# Patient Record
Sex: Male | Born: 1960
Health system: Southern US, Community
[De-identification: ages and names within clinical notes are randomized; demographics above are authoritative.]

## PROBLEM LIST (undated history)

## (undated) DIAGNOSIS — I4891 Unspecified atrial fibrillation: Secondary | ICD-10-CM

## (undated) DIAGNOSIS — F419 Anxiety disorder, unspecified: Secondary | ICD-10-CM

## (undated) DIAGNOSIS — E7800 Pure hypercholesterolemia, unspecified: Secondary | ICD-10-CM

## (undated) DIAGNOSIS — I639 Cerebral infarction, unspecified: Secondary | ICD-10-CM

## (undated) DIAGNOSIS — G4733 Obstructive sleep apnea (adult) (pediatric): Secondary | ICD-10-CM

## (undated) DIAGNOSIS — I10 Essential (primary) hypertension: Secondary | ICD-10-CM

## (undated) DIAGNOSIS — I519 Heart disease, unspecified: Secondary | ICD-10-CM

## (undated) HISTORY — DX: Cerebral infarction, unspecified: I63.9

## (undated) HISTORY — DX: Essential (primary) hypertension: I10

## (undated) HISTORY — DX: Pure hypercholesterolemia, unspecified: E78.00

## (undated) HISTORY — DX: Obstructive sleep apnea (adult) (pediatric): G47.33

## (undated) HISTORY — DX: Heart disease, unspecified: I51.9

## (undated) HISTORY — DX: Morbid (severe) obesity due to excess calories: E66.01

## (undated) HISTORY — DX: Unspecified atrial fibrillation: I48.91

## (undated) HISTORY — DX: Anxiety disorder, unspecified: F41.9

---

## 1984-04-10 HISTORY — PX: HERNIA REPAIR: SHX51

## 2004-04-20 ENCOUNTER — Emergency Department (HOSPITAL_COMMUNITY): Admission: EM | Admit: 2004-04-20 | Discharge: 2004-04-20 | Payer: Self-pay | Admitting: Emergency Medicine

## 2004-10-06 ENCOUNTER — Ambulatory Visit (HOSPITAL_BASED_OUTPATIENT_CLINIC_OR_DEPARTMENT_OTHER): Admission: RE | Admit: 2004-10-06 | Discharge: 2004-10-06 | Payer: Self-pay | Admitting: Family Medicine

## 2004-10-09 ENCOUNTER — Ambulatory Visit: Payer: Self-pay | Admitting: Internal Medicine

## 2006-03-22 ENCOUNTER — Ambulatory Visit: Payer: Self-pay | Admitting: Internal Medicine

## 2006-03-29 ENCOUNTER — Ambulatory Visit: Payer: Self-pay

## 2006-03-29 ENCOUNTER — Ambulatory Visit: Payer: Self-pay | Admitting: Internal Medicine

## 2008-10-02 ENCOUNTER — Ambulatory Visit (HOSPITAL_COMMUNITY): Admission: RE | Admit: 2008-10-02 | Discharge: 2008-10-02 | Payer: Self-pay | Admitting: Gastroenterology

## 2009-10-05 ENCOUNTER — Encounter: Admission: RE | Admit: 2009-10-05 | Discharge: 2009-10-05 | Payer: Self-pay | Admitting: Internal Medicine

## 2010-01-04 ENCOUNTER — Encounter: Payer: Self-pay | Admitting: Internal Medicine

## 2010-03-17 ENCOUNTER — Ambulatory Visit: Payer: Self-pay | Admitting: Internal Medicine

## 2010-06-01 ENCOUNTER — Encounter: Payer: Self-pay | Admitting: Internal Medicine

## 2010-06-01 ENCOUNTER — Encounter (INDEPENDENT_AMBULATORY_CARE_PROVIDER_SITE_OTHER): Payer: 59 | Admitting: Internal Medicine

## 2010-06-01 DIAGNOSIS — I480 Paroxysmal atrial fibrillation: Secondary | ICD-10-CM

## 2010-06-01 DIAGNOSIS — G4733 Obstructive sleep apnea (adult) (pediatric): Secondary | ICD-10-CM | POA: Insufficient documentation

## 2010-06-01 DIAGNOSIS — I4891 Unspecified atrial fibrillation: Secondary | ICD-10-CM | POA: Insufficient documentation

## 2010-06-01 HISTORY — DX: Paroxysmal atrial fibrillation: I48.0

## 2010-06-07 NOTE — Assessment & Plan Note (Signed)
Summary: ec6/eval for ablation/.Marland KitchenMarland KitchenPER NOTES PT HAS [facility]/....   Visit Type:  np Referring Provider:  Jacinto Halim  CC:  fatigue.  History of Present Illness: Mr. Halt is seen at the request of Dr. Jacinto Halim because of atrial arrhythmias.  He is a morbidly obese American gentleman with treated obstructive sleep apnea, significant hypertension whom I met 4-1/2 years ago because of atrial arrhythmias occurring following an ablation for isthmus dependent flutter undertaken in [place] in 2003. Medical therapy was recommended. He was started on flecainide. This worked really well until just recently when he began having recurrences of tachycardia palpitations associated with shortness of breath and effort intolerance. Electrocardiogram demonstrated rapid SVT at the rate of 140 or so.  a 12-lead suggested perhaps a coarse atrial fibrillation.  His flecainide dose was increased and he has reverted to sinus rhythm. He is feeling better. Cardiac evaluation in the fall included an ultrasound demonstrating normal left ventricular function modest decrease in right ventricular function biatrial enlargement. Left ventricular dimensions and wall thicknesses are not available yet.  Thrombo- embolic risk factors are notable for prior stroke-2, hypertension-one.  his sleep apnea therapy is working quite well. He continues on warfarin.    Current Medications (verified): 1)  Flecainide Acetate 150 Mg Tabs (Flecainide Acetate) .... Two Times A Day 2)  Verapamil Hcl Cr 180 Mg Xr24h-Cap (Verapamil Hcl) .... Take One Capsule By Mouth Daily 3)  Diovan 320 Mg Tabs (Valsartan) .... Once Daily 4)  Allopurinol 300 Mg Tabs (Allopurinol) .... Once Daily 5)  Crestor 10 Mg Tabs (Rosuvastatin Calcium) .... Take One Tablet By Mouth Daily. 6)  Paxil 10 Mg Tabs (Paroxetine Hcl) .... Once Daily 7)  Warfarin Sodium 5 Mg Tabs (Warfarin Sodium) .... Use As Directed By Anticoagulation Clinic  Allergies (verified): No Known  Drug Allergies  Past History:  Past Medical History: Atrial Fibrillation Atrial Flutter Hyperlipidemia Hypertension Stroke 2001 Paroxysmal A.Fibrillation s/p ablation 2003 in [place] Chronic kidney disease Shortness of Breath Dyspnea gout  Past Surgical History: Cardiac Catheterization  Review of Systems       full review of systems was negative apart from a history of present illness and past medical history.   Vital Signs:  Patient profile:   50 year old male Height:      69 inches Weight:      341.25 pounds BMI:     50.58 Pulse rate:   74 / minute BP sitting:   154 / 101  (left arm) Cuff size:   regular  Vitals Entered By: Caralee Ates CMA (June 01, 2010 2:36 PM)  Physical Exam  General:  Well developed, morbidly obese middle-aged [place] American male appearing his stated age in no acute distress. Head:  normal HEENT Neck:  supple without thyromegaly; neck veins 10 cm Chest Wall:  without kyphosis Lungs:  clear to auscultation Heart:  distant heart sounds with a regular rhythm Abdomen:  soft, distended, active bowel sounds liver edge is 2 cm down positive HJR Msk:  Back normal, normal gait. Muscle strength and tone normal. Pulses:  pulses normal in all 4 extremities Extremities:  No clubbing or cyanosis or edema Neurologic:  Alert and oriented x 3.grossly normal motor and sensory function Skin:  without rashes but with tattoos Cervical Nodes:  no adenopathy Psych:  engaging affect   Impression & Recommendations:  Problem # 1:  ATRIAL FIBRILLATION (ICD-427.31) the patient has a coarse atrial fibrillation. It is possible that there is a flutter circuit here but  I'm not convinced. He had atrial fibrillation some years ago. Regular satiety to fibrillation with the flecainide is certainly possible. Currently on a higher dose of flecainide he is holding sinus rhythm. Pruritic potential for flexion 90s to be reassessed. A Myoview scan was negative. (November 11) We  need to know d left ventricular wall thickness dimensions; left atrial dimensions will be helpful in protecting the benefit of possible ablation. His updated medication list for this problem includes:    Flecainide Acetate 150 Mg Tabs (Flecainide acetate) .Marland Kitchen..Marland Kitchen Two times a day    Warfarin Sodium 5 Mg Tabs (Warfarin sodium) ..... Use as directed by anticoagulation clinic  Orders: EKG w/ Interpretation (93000)  Problem # 2:  MORBID OBESITY (ICD-278.01) obesity continues to this man's major health risk. He has gained 120 pounds in the last year and a half. This is back at the time of the death of his wife. His daughter has less cool.  Problem # 3:  HTN HRT & CKD BEN W/O HF & W/CKD STAGE (ICD-404.10) his blood pressure remains poorly controlled. His Diovan was increased today. I would favor increasing his verapamil or adding a beta blocker this will augment rate control. We'll see how he does on a higher dose Diovan His updated medication list for this problem includes:    Verapamil Hcl Cr 180 Mg Xr24h-cap (Verapamil hcl) .Marland Kitchen... Take one capsule by mouth daily    Diovan 320 Mg Tabs (Valsartan) ..... Once daily  Problem # 4:  SLEEP APNEA (ICD-780.57) this appears to be well treated  Patient Instructions: 1)  Your physician recommends that you continue on your current medications as directed. Please refer to the Current Medication list given to you today. 2)  Your physician wants you to follow-up in:  6 MONTHS WITH DR Logan Bores will receive a reminder letter in the mail two months in advance. If you don't receive a letter, please call our office to schedule the follow-up appointment.

## 2010-08-26 NOTE — Procedures (Signed)
Ricky Burton, Ricky Burton              ACCOUNT NO.:  [id number]   MEDICAL RECORD NO.:  [id number]          PATIENT TYPE:  OUT   LOCATION:  SLEEP CENTER                 FACILITY:  [facility]   PHYSICIAN:  Clinton D. Maple Hudson, M.D. DATE OF BIRTH:  [birth year]   DATE OF STUDY:  10/06/2004                              NOCTURNAL POLYSOMNOGRAM   REFERRING PHYSICIAN:  Dr. Casimiro Needle Hilts   INDICATION FOR STUDY:  Hypersomnia with sleep apnea.   EPWORTH SLEEPINESS SCORE:  14/24   BMI:  40   WEIGHT:  280 pounds   SLEEP ARCHITECTURE:  Total sleep time 414 minutes with sleep efficiency 86%.  Stage I 9%, Stage II 56%.  Stages III and IV are absent.  REM 35% of total  sleep time.  Sleep latency 22 minutes.  REM latency 107 minutes.  Awake  after sleep onset 47 minutes.  Arousal index 22.  No bedtime medication  taken.   RESPIRATORY DATA:  Respiratory disturbance index (RDI AHI) 57.7 obstructive  events per hour, indicating severe obstructive sleep apnea/hypopnea syndrome  for CPAP.  There were 136 obstructive apneas and 1 hypopnea before CPAP.  The events were not positional.  REM RDI 20 per hour.  CPAP was titrated to  16 CWP, RDI 5.3 per hour.  A large Respironics Comfort Full Series II mask  was used with heated humidifier.  He had some initial claustrophobia, then  relaxed and was able to sleep comfortably with CPAP.   OXYGEN DATA:  Moderate snoring with oxygen desaturation to a nadir of 50%  before CPAP.  After CPAP control, saturation held 92-98% on room air.   CARDIAC DATA:  Normal sinus rhythm with rare PVC.   MOVEMENT/PARASOMNIA:  Occasional leg jerk of insignificance.   IMPRESSION/RECOMMENDATION:  1.  Severe obstructive sleep apnea/hypopnea syndrome, RDI 57.7 per hour with      moderate snoring and oxygen desaturation to 50%.  2.  Successful CPAP titration to 16 CWP, RDI 5.3 per hour, using a large      Respironics Comfort Full Series II mask with heated humidifier.      Clinton D.  Maple Hudson, M.D.  Diplomat    CDY/MEDQ  D:  10/09/2004 14:00:29  T:  10/10/2004 06:55:38  Job:  [id number]

## 2010-08-26 NOTE — Letter (Signed)
March 29, 2006    Madaline Savage, M.D.  [phone] [address]  [place], [place] [address]   RE:  EMAD, BRECHTEL  MRN:  [id number]  /  DOB:  15-Jan-1961   Dear Annette Stable,   Mr. Stfort came in today for flecainide treadmill testing.  He was  submitted to a modification on Bruce protocol and achieved about 30  seconds in a modified stage IV.  It was notable for accomplishing a  heart rate of 146, representing 85% of his predicted maximal.  It is  also notable for significant systolic hypertension with blood pressure  in the 215 range.  His diastolic at rest was 104.   He has had problems with orthostatic hypotension in the past and low  blood pressures with an ARB/HCT combination.  I have asked that when he  sees you next week that he can discuss his blood pressure issues with  you.   If there is anything further we can do in his care, please do not  hesitate to contact us.    Sincerely,      Duke Salvia, MD, [facility]  Electronically Signed    SCK/MedQ  DD: 03/29/2006  DT: 03/29/2006  Job #: [id number]

## 2010-08-26 NOTE — Letter (Signed)
March 22, 2006    Madaline Savage, M.D.  [phone] [address], [address]  [place], [place] [address]   RE:  Ricky Burton, Ricky Burton  MRN:  [id number]  /  DOB:  04/25/1960   Dear Annette Stable:   It was a pleasure to see your patient and talk about him with you today.  As you know, [name] is a 50 year old gentleman with a history of  atrial arrhythmias.  He underwent atrial flutter ablation a couple of  years ago in [place].  He has a history of a stroke that resulted in  him being disabled in 2001 and has thromboembolic risk factors notable  for hypertension and congestive failure.   Over the last number of months and retrospectively earlier than that, he  has had problems with tachy palpitations that is accompanied by  lightheadedness, dyspnea.  He has not had peripheral edema.   He was seen initially and put on metoprolol.  This was associated with  orthostatic lightheadedness which prompted changes in medication to  Toprol.  I think that changed happened, but ultimately you saw him and  put him on sotalol 80 mg a day after undertaking a cardiac evaluation,  including an echo that was normal, with normal LA size, surprisingly,  and a Myoview scan that he reports was also negative.   The sotalol has been associated with a marked improvement in his  symptoms, even at the 80 mg dose.  He is less lightheaded.  He continues  to have irregularities, but nothing has been terribly fast.   Exercise tolerance has been better.   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  He started at 201 pounds, he got up to 330 pounds while  driving.  He is now down to 260.  He works out a couple days a week, as  much as he can, with treadmill, stationary bicycle and weights.   His thromboembolic risk factors are as noted above.   PAST MEDICAL HISTORY:  In addition to the above, is notable for:  1. Anxiety/depression (see below).  2. Gout.  3. Urinary problems.   PAST  SURGICAL HISTORY:  Notable for hip surgery x2 as well as his  catheter ablation undertaken in [place].   SOCIAL HISTORY:  He is retired/disabled.  He now takes care of his  elderly parents who live in [place].  He has one daughter who is a  Holiday representative at [employer].  He does not use cigarettes, alcohol or recreational  drugs.   MEDICATIONS:  Currently include:  1. Sotalol 80 b.i.d.  2. Aspirin 81.  3. Toprol - discontinued.  4. Coumadin.  5. Allopurinol.  6. Diovan 160.   NO KNOWN DRUG ALLERGIES.   EXAMINATION:  He is a middle-aged African-American male in no acute  distress.  His blood pressure is 142/102 with a pulse of 60 and there  was no significant orthostatic change initially with standing, although  with 2 minutes of standing his blood pressure peaked to 153 and then at  5 minutes it was 138, there were no associated symptoms.  His weight was  263.  HEENT:  Demonstrated no icterus or xanthoma.  The neck veins were flat.  The carotids were brisk and full bilaterally without bruits.  BACK:  Without kyphosis, scoliosis.  LUNGS:  Clear.  HEART SOUNDS:  Regular without murmurs or gallops.  ABDOMEN:  Soft with active bowel sounds without  midline pulsation or  hepatomegaly.  Femoral pulses were 2+, distal pulses were intact.  There  is no clubbing, cyanosis, there is trace edema.  NEUROLOGICAL:  Grossly normal, apart from emotional lability that as we  talked he continued to become almost tearful.  He also expressed a great  deal of anxiety about the prognostic implications of his atrial  arrhythmia, wondering whether it would cause him to dye.   Review of the event recorder strips that you sent was very, very  helpful.  I agree that there is a multitude of arrhythmias, including:  1. Atrial flutter with 2:1 conduction.  2. A lot of atrial fibrillation that is quite coarse, with suggestions      of flutter waves but the waves of which are quite variable.  Rates      are quite  fast and up to 200 b.p.m.   IMPRESSION:  1. Paroxysmal atrial flutter and fibrillation, primarily the latter.  2. Status post prior cavotricuspid isthmus ablation in [place].  3. Thromboembolic risk factors notable for:      a.     Hypertension.      b.     Prior stroke.      c.     Congestive failure - diastolic.  4. Morbid obesity with a 70 pound weight loss thus far with 70 pounds      still to go.  5. Anxiety/depression.  6. Propensity towards bradycardia, noted on electrocardiogram by Dr.      Elsie Lincoln with a rate of 55.  7. Orthostatic intolerance.   Bill, Mr. Mcenery has significant atrial arrhythmias in the context of a  previous atrial flutter ablation.  We are seeing that this is more of  the case where the atrial flutter seems to be a harbinger of atrial  fibrillation yet to come.  In him, it is here.   It is quite symptomatic for him and I think part of this is the anxiety  overlay, part of it is the very rapid rates.  A rate controlling  strategy is, I think, unlikely to be particularly successful.  I think  the likelihood of being able to manage this with rate control is  unlikely given the propensity to bradycardia and the rapid rates that we  have been seeing.  Thus, I think a rhythm controlling strategy is a  useful approach.  Given the absence of coronary disease and normal left  ventricular function, I would favor the use of a 1C as we discussed.   I have given him a prescription today for her flecainide 100 mg to be  taken twice daily, which we will start 4 days prior to a treadmill which  is scheduled for the middle of next week.  He will need rate control and  as we go ahead and stop his sotalol today and allow it to wash out we  will begin verapamil 180 mg to be taken that night.   As it relates to his orthostatic intolerance, this seems to be an  afternoon phenomenon.  I have suggested that he use isometric exercises to try and minimize the likelihood of  that.   I have advised him to call your office for an appointment and follow up  at about 4 weeks.   If there is anything else I can do, please do not hesitate to contact  me.   I hope this letter finds you well.  I wish you and your family a  Merry  Christmas.    Sincerely,      Duke Salvia, MD, [facility]  Electronically Signed    SCK/MedQ  DD: 03/22/2006  DT: 03/22/2006  Job #: [id number]   CC:    Lillia Carmel, M.D.

## 2011-05-11 DIAGNOSIS — Z7901 Long term (current) use of anticoagulants: Secondary | ICD-10-CM | POA: Diagnosis not present

## 2011-05-11 DIAGNOSIS — I119 Hypertensive heart disease without heart failure: Secondary | ICD-10-CM | POA: Diagnosis not present

## 2011-05-11 DIAGNOSIS — M109 Gout, unspecified: Secondary | ICD-10-CM | POA: Diagnosis not present

## 2011-05-11 DIAGNOSIS — E7800 Pure hypercholesterolemia, unspecified: Secondary | ICD-10-CM | POA: Diagnosis not present

## 2011-06-21 DIAGNOSIS — Z7901 Long term (current) use of anticoagulants: Secondary | ICD-10-CM | POA: Diagnosis not present

## 2011-07-11 DIAGNOSIS — I4891 Unspecified atrial fibrillation: Secondary | ICD-10-CM | POA: Diagnosis not present

## 2011-07-11 DIAGNOSIS — I119 Hypertensive heart disease without heart failure: Secondary | ICD-10-CM | POA: Diagnosis not present

## 2011-07-11 DIAGNOSIS — Z7901 Long term (current) use of anticoagulants: Secondary | ICD-10-CM | POA: Diagnosis not present

## 2011-07-27 DIAGNOSIS — Z7901 Long term (current) use of anticoagulants: Secondary | ICD-10-CM | POA: Diagnosis not present

## 2011-07-27 DIAGNOSIS — I4891 Unspecified atrial fibrillation: Secondary | ICD-10-CM | POA: Diagnosis not present

## 2011-08-29 DIAGNOSIS — Z7901 Long term (current) use of anticoagulants: Secondary | ICD-10-CM | POA: Diagnosis not present

## 2011-10-09 DIAGNOSIS — J019 Acute sinusitis, unspecified: Secondary | ICD-10-CM | POA: Diagnosis not present

## 2011-10-09 DIAGNOSIS — J209 Acute bronchitis, unspecified: Secondary | ICD-10-CM | POA: Diagnosis not present

## 2012-01-03 DIAGNOSIS — I4891 Unspecified atrial fibrillation: Secondary | ICD-10-CM | POA: Diagnosis not present

## 2012-01-03 DIAGNOSIS — I119 Hypertensive heart disease without heart failure: Secondary | ICD-10-CM | POA: Diagnosis not present

## 2012-01-03 DIAGNOSIS — E7800 Pure hypercholesterolemia, unspecified: Secondary | ICD-10-CM | POA: Diagnosis not present

## 2012-02-13 DIAGNOSIS — I4891 Unspecified atrial fibrillation: Secondary | ICD-10-CM | POA: Diagnosis not present

## 2012-02-13 DIAGNOSIS — I119 Hypertensive heart disease without heart failure: Secondary | ICD-10-CM | POA: Diagnosis not present

## 2012-02-13 DIAGNOSIS — Z7901 Long term (current) use of anticoagulants: Secondary | ICD-10-CM | POA: Diagnosis not present

## 2012-02-13 DIAGNOSIS — Z006 Encounter for examination for normal comparison and control in clinical research program: Secondary | ICD-10-CM | POA: Diagnosis not present

## 2012-02-28 DIAGNOSIS — E662 Morbid (severe) obesity with alveolar hypoventilation: Secondary | ICD-10-CM | POA: Diagnosis not present

## 2012-02-28 DIAGNOSIS — G4733 Obstructive sleep apnea (adult) (pediatric): Secondary | ICD-10-CM | POA: Diagnosis not present

## 2012-02-28 DIAGNOSIS — G4736 Sleep related hypoventilation in conditions classified elsewhere: Secondary | ICD-10-CM | POA: Diagnosis not present

## 2012-03-19 DIAGNOSIS — I4891 Unspecified atrial fibrillation: Secondary | ICD-10-CM | POA: Diagnosis not present

## 2012-03-19 DIAGNOSIS — E291 Testicular hypofunction: Secondary | ICD-10-CM | POA: Diagnosis not present

## 2012-03-25 ENCOUNTER — Other Ambulatory Visit (HOSPITAL_COMMUNITY): Payer: Self-pay | Admitting: Internal Medicine

## 2012-03-25 DIAGNOSIS — R109 Unspecified abdominal pain: Secondary | ICD-10-CM

## 2012-03-25 DIAGNOSIS — R1032 Left lower quadrant pain: Secondary | ICD-10-CM | POA: Diagnosis not present

## 2012-03-26 ENCOUNTER — Ambulatory Visit (HOSPITAL_COMMUNITY): Payer: 59

## 2012-03-27 ENCOUNTER — Other Ambulatory Visit (HOSPITAL_COMMUNITY): Payer: Self-pay | Admitting: Internal Medicine

## 2012-03-27 ENCOUNTER — Ambulatory Visit (HOSPITAL_COMMUNITY)
Admission: RE | Admit: 2012-03-27 | Discharge: 2012-03-27 | Disposition: A | Discharge status: Home / Self Care | Payer: 59 | Source: Ambulatory Visit | Attending: Internal Medicine | Admitting: Internal Medicine

## 2012-03-27 DIAGNOSIS — R319 Hematuria, unspecified: Secondary | ICD-10-CM

## 2012-03-27 DIAGNOSIS — R109 Unspecified abdominal pain: Secondary | ICD-10-CM | POA: Insufficient documentation

## 2012-03-27 DIAGNOSIS — N209 Urinary calculus, unspecified: Secondary | ICD-10-CM | POA: Insufficient documentation

## 2012-03-27 DIAGNOSIS — M549 Dorsalgia, unspecified: Secondary | ICD-10-CM | POA: Insufficient documentation

## 2012-03-27 DIAGNOSIS — N289 Disorder of kidney and ureter, unspecified: Secondary | ICD-10-CM | POA: Diagnosis not present

## 2012-03-27 DIAGNOSIS — K5730 Diverticulosis of large intestine without perforation or abscess without bleeding: Secondary | ICD-10-CM | POA: Diagnosis not present

## 2012-03-27 DIAGNOSIS — I517 Cardiomegaly: Secondary | ICD-10-CM | POA: Diagnosis not present

## 2012-03-27 DIAGNOSIS — J9819 Other pulmonary collapse: Secondary | ICD-10-CM | POA: Diagnosis not present

## 2012-03-27 DIAGNOSIS — M47817 Spondylosis without myelopathy or radiculopathy, lumbosacral region: Secondary | ICD-10-CM | POA: Diagnosis not present

## 2012-03-27 DIAGNOSIS — Q619 Cystic kidney disease, unspecified: Secondary | ICD-10-CM | POA: Diagnosis not present

## 2012-03-27 DIAGNOSIS — K4090 Unilateral inguinal hernia, without obstruction or gangrene, not specified as recurrent: Secondary | ICD-10-CM | POA: Insufficient documentation

## 2012-03-28 ENCOUNTER — Ambulatory Visit (HOSPITAL_COMMUNITY)
Admission: RE | Admit: 2012-03-28 | Discharge: 2012-03-28 | Disposition: A | Discharge status: Home / Self Care | Payer: 59 | Source: Ambulatory Visit | Attending: Internal Medicine | Admitting: Internal Medicine

## 2012-03-28 DIAGNOSIS — N281 Cyst of kidney, acquired: Secondary | ICD-10-CM | POA: Diagnosis not present

## 2012-03-28 DIAGNOSIS — R109 Unspecified abdominal pain: Secondary | ICD-10-CM | POA: Insufficient documentation

## 2012-03-28 DIAGNOSIS — R319 Hematuria, unspecified: Secondary | ICD-10-CM | POA: Diagnosis not present

## 2012-03-28 DIAGNOSIS — Q619 Cystic kidney disease, unspecified: Secondary | ICD-10-CM | POA: Insufficient documentation

## 2012-03-28 MED ORDER — IOHEXOL 300 MG/ML  SOLN
100.0000 mL | Freq: Once | INTRAMUSCULAR | Status: AC | PRN
  Administered 2012-03-28: 100 mL via INTRAVENOUS

## 2012-04-16 DIAGNOSIS — Z7901 Long term (current) use of anticoagulants: Secondary | ICD-10-CM | POA: Diagnosis not present

## 2012-04-16 DIAGNOSIS — R351 Nocturia: Secondary | ICD-10-CM | POA: Diagnosis not present

## 2012-04-16 DIAGNOSIS — R05 Cough: Secondary | ICD-10-CM | POA: Diagnosis not present

## 2012-04-16 DIAGNOSIS — E782 Mixed hyperlipidemia: Secondary | ICD-10-CM | POA: Diagnosis not present

## 2012-04-16 DIAGNOSIS — R059 Cough, unspecified: Secondary | ICD-10-CM | POA: Diagnosis not present

## 2012-04-16 DIAGNOSIS — I4891 Unspecified atrial fibrillation: Secondary | ICD-10-CM | POA: Diagnosis not present

## 2012-04-16 DIAGNOSIS — I119 Hypertensive heart disease without heart failure: Secondary | ICD-10-CM | POA: Diagnosis not present

## 2012-05-17 DIAGNOSIS — E678 Other specified hyperalimentation: Secondary | ICD-10-CM | POA: Diagnosis not present

## 2012-05-17 DIAGNOSIS — I69959 Hemiplegia and hemiparesis following unspecified cerebrovascular disease affecting unspecified side: Secondary | ICD-10-CM | POA: Diagnosis not present

## 2012-05-17 DIAGNOSIS — G4733 Obstructive sleep apnea (adult) (pediatric): Secondary | ICD-10-CM | POA: Diagnosis not present

## 2012-05-20 DIAGNOSIS — Z7901 Long term (current) use of anticoagulants: Secondary | ICD-10-CM | POA: Diagnosis not present

## 2012-05-20 DIAGNOSIS — R0602 Shortness of breath: Secondary | ICD-10-CM | POA: Diagnosis not present

## 2012-05-20 DIAGNOSIS — Z8679 Personal history of other diseases of the circulatory system: Secondary | ICD-10-CM | POA: Diagnosis not present

## 2012-05-20 DIAGNOSIS — I10 Essential (primary) hypertension: Secondary | ICD-10-CM | POA: Diagnosis not present

## 2012-05-31 ENCOUNTER — Ambulatory Visit
Admission: RE | Admit: 2012-05-31 | Discharge: 2012-05-31 | Disposition: A | Discharge status: Home / Self Care | Payer: 59 | Source: Ambulatory Visit | Attending: Internal Medicine | Admitting: Internal Medicine

## 2012-05-31 ENCOUNTER — Other Ambulatory Visit: Payer: Self-pay | Admitting: Internal Medicine

## 2012-05-31 DIAGNOSIS — M79609 Pain in unspecified limb: Secondary | ICD-10-CM | POA: Diagnosis not present

## 2012-05-31 DIAGNOSIS — M79661 Pain in right lower leg: Secondary | ICD-10-CM

## 2012-05-31 DIAGNOSIS — M7989 Other specified soft tissue disorders: Secondary | ICD-10-CM | POA: Diagnosis not present

## 2012-06-13 DIAGNOSIS — R0602 Shortness of breath: Secondary | ICD-10-CM | POA: Diagnosis not present

## 2012-07-05 DIAGNOSIS — Z7901 Long term (current) use of anticoagulants: Secondary | ICD-10-CM | POA: Diagnosis not present

## 2012-07-05 DIAGNOSIS — I119 Hypertensive heart disease without heart failure: Secondary | ICD-10-CM | POA: Diagnosis not present

## 2012-07-05 DIAGNOSIS — R609 Edema, unspecified: Secondary | ICD-10-CM | POA: Diagnosis not present

## 2012-07-05 DIAGNOSIS — I4891 Unspecified atrial fibrillation: Secondary | ICD-10-CM | POA: Diagnosis not present

## 2012-08-07 ENCOUNTER — Institutional Professional Consult (permissible substitution): Payer: Self-pay | Admitting: Neurology

## 2012-08-07 DIAGNOSIS — Z7901 Long term (current) use of anticoagulants: Secondary | ICD-10-CM | POA: Diagnosis not present

## 2012-08-07 DIAGNOSIS — I4891 Unspecified atrial fibrillation: Secondary | ICD-10-CM | POA: Diagnosis not present

## 2012-09-04 DIAGNOSIS — I4891 Unspecified atrial fibrillation: Secondary | ICD-10-CM | POA: Diagnosis not present

## 2012-09-04 DIAGNOSIS — Z7901 Long term (current) use of anticoagulants: Secondary | ICD-10-CM | POA: Diagnosis not present

## 2012-10-09 DIAGNOSIS — Z7901 Long term (current) use of anticoagulants: Secondary | ICD-10-CM | POA: Diagnosis not present

## 2012-10-09 DIAGNOSIS — I4891 Unspecified atrial fibrillation: Secondary | ICD-10-CM | POA: Diagnosis not present

## 2012-10-10 ENCOUNTER — Encounter: Payer: Self-pay | Admitting: Neurology

## 2012-10-23 NOTE — Progress Notes (Signed)
Quick Note:  I reviewed the patient's PAP compliance data from 05/19/2012 to 06/17/2012, which is a total of 30 days, during which time the patient used BiPAP every day. The average usage for all days was 6 hours and 48 minutes. The percent used days greater than 4 hours was 100 %, indicating excellent compliance. The residual AHI was 16.8 per hour, indicating an inadequate treatment pressure of 17/12 cwp. The patient needs to be contacted for followup appointment if not already arranged. We need to figure out whether to bring the patient back for another titration study or what additional troubleshooting we can do.  Huston Foley, MD, PhD [facility] ([facility])   ______

## 2012-11-06 ENCOUNTER — Encounter: Payer: Self-pay | Admitting: Neurology

## 2012-11-07 ENCOUNTER — Ambulatory Visit (INDEPENDENT_AMBULATORY_CARE_PROVIDER_SITE_OTHER): Payer: 59 | Admitting: Neurology

## 2012-11-07 ENCOUNTER — Encounter: Payer: Self-pay | Admitting: Neurology

## 2012-11-07 VITALS — BP 112/69 | HR 69 | Resp 20 | Ht 70.0 in | Wt 320.0 lb

## 2012-11-07 DIAGNOSIS — G4733 Obstructive sleep apnea (adult) (pediatric): Secondary | ICD-10-CM | POA: Diagnosis not present

## 2012-11-07 DIAGNOSIS — E662 Morbid (severe) obesity with alveolar hypoventilation: Secondary | ICD-10-CM

## 2012-11-07 DIAGNOSIS — G4730 Sleep apnea, unspecified: Secondary | ICD-10-CM

## 2012-11-07 NOTE — Progress Notes (Signed)
[facility]  Provider:  Melvyn Novas, M D  Referring Provider: Ralene Ok, MD Primary Care Physician:  Ralene Ok, MD  Chief Complaint  Patient presents with  . Follow-up    high AHI(16/8),needs to be evaluated, rm 10    HPI:  Ricky Burton is a 52 y.o. male  Is seen here as a referral/ revisit  from Dr. Ludwig Clarks for followup on sleep apnea and CPAP treatment.  [name] was diagnosed in a split-night polysomnography is rather severe apnea on 02/28/2012. Dr. Ludwig Clarks referred him based on the patient's complaint of witnessed apneas, nocturia, dry mouth and severe thunderous snoring. In 2008 a previous sleep study that revealed an AHI of 80 and the patient was titrated to 14 cm water but over the next 6 years he gained about 60 pounds and his pressure needs have changed. He was also reporting again an Epworth sleepiness score of 18/24 points the back inventory was endorsed at 9 points.  The BMI was 50.9 and the neck circumference 20 inches. Baseline AHI was 70.6 the patient did not respond to pain CPAP and was therefore titrated to BiPAP at 17/12 cm water  Today's Epworth sleepiness score is 5 points, agreed reduction. I have access to 3 separate sets of data downloads the first right after initiation of BiPAP therapy showed a residual AHI of still 17 per hour  A 3 months download until July of this year showed an AHI of 8.4, a 6 months download that included the very first weeks of BiPAP he was still had an apnea index of 12.6. Patient is compliant but there are some larger air leaks. All the reduction by 90% in its apnea and hypopnea frequency is a good result.    The patient likes her CPAP or BiPAP machine and stated that he feels much is more rested that he was also able to reduce and his hypertension medications. He has no longer nocturia.  He still likes to use a fullface mask, in spite of facial hair which make the air leak likelier . He  Is here today to check  on the way he puts his mask on and tightens the straps.   We also discuss how to get seriously about  losing weight , he has gathered information about dietary approaches exercise regimen stools weight and also consider is medical-surgical options.  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  I would like to address the residual OSA at 8/hr with an increase in pressure of 1 cm .    Review of Systems: Out of a complete 14 system review, the patient complains of only the following symptoms, and all other reviewed systems are negative. Resolved hypersomnia and nocturia, not depressed, feeling better than in a long time... Happy to have been able to reduce his HTN.   History   Social History  . Marital Status: Married    Spouse Name: N/A    Number of Children: 2  . Years of Education: N/A   Occupational History  . Not on file.   Social History Main Topics  . Smoking status: Never Smoker   . Smokeless tobacco: Not on file  . Alcohol Use: No  . Drug Use: No  . Sexually Active: Not on file   Other Topics Concern  . Not on file   Social History Narrative  . No narrative on file    History reviewed. No pertinent family history.  Past Medical History  Diagnosis Date  . Morbid obesity   . OSA (obstructive sleep apnea)   . High cholesterol     controlled  . Heart disease   . A-fib   . Anxiety     History reviewed. No pertinent past surgical history.  Current Outpatient Prescriptions  Medication Sig Dispense Refill  . allopurinol (ZYLOPRIM) 300 MG tablet Take 300 mg by mouth daily.      . Febuxostat (ULORIC) 80 MG TABS Take by mouth daily.      Marland Kitchen FLECAINIDE ACETATE PO Take by mouth 2 (two) times daily.      Marland Kitchen PARoxetine (PAXIL) 20 MG tablet Take 20 mg by mouth daily. 1/2 tablet daily      . rosuvastatin (CRESTOR) 10 MG tablet Take 10 mg by mouth daily.      . valsartan (DIOVAN) 320 MG tablet Take 320 mg by mouth daily. 1/2 tablet daily      .  verapamil (COVERA HS) 180 MG (CO) 24 hr tablet Take 180 mg by mouth daily.      Marland Kitchen warfarin (COUMADIN) 5 MG tablet Take 5 mg by mouth daily.       No current facility-administered medications for this visit.    Allergies as of 11/07/2012  . (No Known Allergies)    Vitals: BP 112/69  Pulse 69  Resp 20  Ht 5\' 10"  (1.778 m)  Wt 320 lb (145.151 kg)  BMI 45.92 kg/m2 Last Weight:  Wt Readings from Last 1 Encounters:  11/07/12 320 lb (145.151 kg)   Last Height:   Ht Readings from Last 1 Encounters:  11/07/12 5\' 10"  (1.778 m)     Physical exam:  General: The patient is awake, alert and appears not in acute distress. The patient is well groomed. Head: Normocephalic, atraumatic. Neck is supple. Mallampati 4 , neck circumference:19 Cardiovascular:  iregular rate and rhythm- a fib today, without  murmurs or carotid bruit, and without distended neck veins. Respiratory: Lungs are clear to auscultation. Skin:  Without evidence of edema, or rash Trunk: BMI is elevated and patient  has normal posture.  Neurologic exam : The patient is awake and alert, oriented to place and time.  Memory subjective  described as intact.  There is a normal attention span & concentration ability. Speech is fluent without  dysarthria, dysphonia or aphasia. Mood and affect are appropriate.  Cranial nerves: Pupils are equal and briskly reactive to light. Funduscopic exam without  evidence of pallor or edema.  Extraocular movements  in vertical and horizontal planes intact and without nystagmus. Visual fields by finger perimetry are intact. Hearing to finger rub intact.  Facial sensation intact to fine touch. Facial motor strength is symmetric and tongue and uvula move midline.   Assessment:  After physical and neurologic examination, review of laboratory studies, imaging, neurophysiology testing and pre-existing records, assessment is that of the patient wears obstructive sleep apnea related to his obesity.  BiPAP has been well tolerated and the patient is 100% compliant, he had dated downloads in the first month of BiPAP use, after 90 days and after 180 days. His residual AHI has decreased to a about 8, she has learned how to fit his mask a little better, but wants to continue using a fullface mask. Nocturia daytime sleepiness and snoring resolved, overnight sleep time is 6 hours of restful sleep.   Plan:   Continue BiPAP use, other asked advanced on care to set the machine 1 cm higher for the inspiratory pressure. Physical  followup in 1 year. Information and discussion about dietary and nutritional approaches to weight loss, a moderate exercise regimen and the remaining surgical options were conducted. The patient already has a lot of information available to him. I also suggested to look at intermittent fast.

## 2012-11-07 NOTE — Patient Instructions (Signed)
CPAP and BIPAP CPAP and BIPAP are methods of helping you breathe. CPAP stands for "continuous positive airway pressure." BIPAP stands for "bi-level positive airway pressure." Both CPAP and BIPAP are provided by a small machine with a flexible plastic tube that attaches to a plastic mask that goes over your nose or mouth. Air is blown into your air passages through your nose or mouth. This helps to keep your airways open and helps to keep you breathing well. The amount of pressure that is used to blow the air into your air passages can be set on the machine. The pressure setting is based on your needs. With CPAP, the amount of pressure stays the same while you breathe in and out. With BIPAP, the amount of pressure changes when you inhale and exhale. Your caregiver will recommend whether CPAP or BIPAP would be more helpful for you.  CPAP and BIPAP can be helpful for both adults and children with:  Sleep apnea.  Chronic Obstructive Pulmonary Disease (COPD), a condition like emphysema.  Diseases which weaken the muscles of the chest such as muscular dystrophy or neurological diseases.  Other problems that cause breathing to be weak or difficult. USE OF CPAP OR BIPAP The respiratory therapist or technician will help you get used to wearing the mask. Some people feel claustrophobic (a trapped or closed in feeling) at first, because the mask needs to be fairly snug on your face.   It may help you to get used to the mask gradually, by first holding the mask loosely over your nose or mouth using a low pressure setting on the machine. Gradually the mask can be applied more snugly with increased pressure. You can also gradually increase the amount of time the mask is used.  People with sleep apnea will use the mask and machine at night when they are sleeping. Others, like those with ALS or other breathing difficulties, may need the CPAP or BIPAP all the time.  If the first mask you try does not fit well, or  is uncomfortable, there are other types and sizes that can be tried.  If you tend to breathe through your mouth, a chin strap may be applied to help keep your mouth closed (if you are using a nasal mask).  The CPAP and BIPAP machines have alarms that may sound if the mask comes off or develops a leak.  You should not eat or drink while the CPAP or BIPAP is on. Food or fluids could get pushed into your lungs by the pressure of the CPAP or BIPAP. Sometimes CPAP or BIPAP machines are ordered for home use. If you are going to use the CPAP or BIPAP machine at home, follow these instructions  CPAP or BIPAP machines can be rented or purchased through home health care companies. There are many different brands of machines available. If you rent a machine before purchasing you may find which particular machine works well for you.  Ask questions if there is something you do not understand when picking out your machine.  Place your CPAP or BIPAP machine on a secure table or stand near an electrical outlet.  Know where the On/Off switch is.  Follow your doctor's instructions for how to set the pressure on your machine and when you should use it.  Do not smoke! Tobacco smoke residue can damage the machine. SEEK IMMEDIATE MEDICAL CARE IF:   You have redness or open areas around your nose or mouth.  You have trouble operating   the CPAP or BIPAP machine.  You cannot tolerate wearing the CPAP or BIPAP mask.  You have any questions or concerns. Document Released: 12/24/2003 Document Revised: 06/19/2011 Document Reviewed: 03/24/2008 ExitCare Patient Information 2014 [employer]. Exercise to Lose Weight Exercise and a healthy diet may help you lose weight. Your doctor may suggest specific exercises. EXERCISE IDEAS AND TIPS  Choose low-cost things you enjoy doing, such as walking, bicycling, or exercising to workout videos.  Take stairs instead of the elevator.  Walk during your lunch  break.  Park your car further away from work or school.  Go to a gym or an exercise class.  Start with 5 to 10 minutes of exercise each day. Build up to 30 minutes of exercise 4 to 6 days a week.  Wear shoes with good support and comfortable clothes.  Stretch before and after working out.  Work out until you breathe harder and your heart beats faster.  Drink extra water when you exercise.  Do not do so much that you hurt yourself, feel dizzy, or get very short of breath. Exercises that burn about 150 calories:  Running 1  miles in 15 minutes.  Playing volleyball for 45 to 60 minutes.  Washing and waxing a car for 45 to 60 minutes.  Playing touch football for 45 minutes.  Walking 1  miles in 35 minutes.  Pushing a stroller 1  miles in 30 minutes.  Playing basketball for 30 minutes.  Raking leaves for 30 minutes.  Bicycling 5 miles in 30 minutes.  Walking 2 miles in 30 minutes.  Dancing for 30 minutes.  Shoveling snow for 15 minutes.  Swimming laps for 20 minutes.  Walking up stairs for 15 minutes.  Bicycling 4 miles in 15 minutes.  Gardening for 30 to 45 minutes.  Jumping rope for 15 minutes.  Washing windows or floors for 45 to 60 minutes. Document Released: 04/29/2010 Document Revised: 06/19/2011 Document Reviewed: 04/29/2010 Exit[employer] Patient Information 2014 [employer].  

## 2012-11-08 ENCOUNTER — Encounter: Payer: Self-pay | Admitting: Neurology

## 2012-11-13 DIAGNOSIS — G4733 Obstructive sleep apnea (adult) (pediatric): Secondary | ICD-10-CM | POA: Diagnosis not present

## 2012-11-13 DIAGNOSIS — E662 Morbid (severe) obesity with alveolar hypoventilation: Secondary | ICD-10-CM | POA: Diagnosis not present

## 2012-11-13 DIAGNOSIS — Z8679 Personal history of other diseases of the circulatory system: Secondary | ICD-10-CM | POA: Diagnosis not present

## 2012-11-21 DIAGNOSIS — I119 Hypertensive heart disease without heart failure: Secondary | ICD-10-CM | POA: Diagnosis not present

## 2012-11-21 DIAGNOSIS — I4891 Unspecified atrial fibrillation: Secondary | ICD-10-CM | POA: Diagnosis not present

## 2012-11-21 DIAGNOSIS — G4733 Obstructive sleep apnea (adult) (pediatric): Secondary | ICD-10-CM | POA: Diagnosis not present

## 2012-11-21 DIAGNOSIS — Z7901 Long term (current) use of anticoagulants: Secondary | ICD-10-CM | POA: Diagnosis not present

## 2012-12-18 DIAGNOSIS — I4891 Unspecified atrial fibrillation: Secondary | ICD-10-CM | POA: Diagnosis not present

## 2012-12-18 DIAGNOSIS — Z7901 Long term (current) use of anticoagulants: Secondary | ICD-10-CM | POA: Diagnosis not present

## 2013-01-15 DIAGNOSIS — Z7901 Long term (current) use of anticoagulants: Secondary | ICD-10-CM | POA: Diagnosis not present

## 2013-01-15 DIAGNOSIS — I4891 Unspecified atrial fibrillation: Secondary | ICD-10-CM | POA: Diagnosis not present

## 2013-02-25 DIAGNOSIS — E7800 Pure hypercholesterolemia, unspecified: Secondary | ICD-10-CM | POA: Diagnosis not present

## 2013-02-25 DIAGNOSIS — I119 Hypertensive heart disease without heart failure: Secondary | ICD-10-CM | POA: Diagnosis not present

## 2013-02-25 DIAGNOSIS — I4891 Unspecified atrial fibrillation: Secondary | ICD-10-CM | POA: Diagnosis not present

## 2013-02-25 DIAGNOSIS — Z7901 Long term (current) use of anticoagulants: Secondary | ICD-10-CM | POA: Diagnosis not present

## 2013-07-24 DIAGNOSIS — I6990 Unspecified sequelae of unspecified cerebrovascular disease: Secondary | ICD-10-CM | POA: Diagnosis not present

## 2013-07-24 DIAGNOSIS — E662 Morbid (severe) obesity with alveolar hypoventilation: Secondary | ICD-10-CM | POA: Diagnosis not present

## 2013-07-24 DIAGNOSIS — E782 Mixed hyperlipidemia: Secondary | ICD-10-CM | POA: Diagnosis not present

## 2013-07-24 DIAGNOSIS — Z8679 Personal history of other diseases of the circulatory system: Secondary | ICD-10-CM | POA: Diagnosis not present

## 2013-10-22 DIAGNOSIS — I4891 Unspecified atrial fibrillation: Secondary | ICD-10-CM | POA: Diagnosis not present

## 2013-10-22 DIAGNOSIS — Z7901 Long term (current) use of anticoagulants: Secondary | ICD-10-CM | POA: Diagnosis not present

## 2013-11-10 ENCOUNTER — Encounter: Payer: Self-pay | Admitting: *Deleted

## 2013-11-11 ENCOUNTER — Ambulatory Visit: Payer: 59 | Admitting: Neurology

## 2014-01-21 IMAGING — CT CT ABD-PELV W/ CM
2 of 7 series · 14 of 46 positions shown, 19 images · IV contrast (APPLIED)
Comparison: CT without contrast [DATE] 7171

CLINICAL DATA: Flank pain.  Renal stranding on noncontrast CT.

CT ABDOMEN AND PELVIS WITH CONTRAST
TECHNIQUE: Multidetector CT imaging of the abdomen and pelvis was
performed following the standard protocol during bolus
administration of intravenous contrast.
Contrast: 100mL OMNIPAQUE IOHEXOL 300 MG/ML  SOLN

[Series 5: 10 min delays 5.0 b31f st · axial · 0.87mm/px · z∈[-602,-166]mm · 11 of 101 slices shown, 16 images]
[im 7/101  soft-tissue]
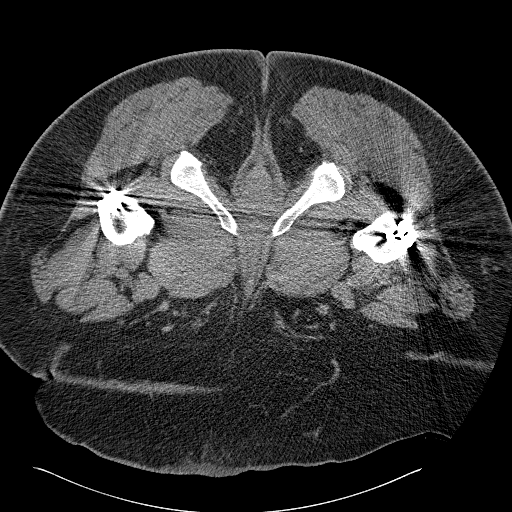
[im 7/101  bone]
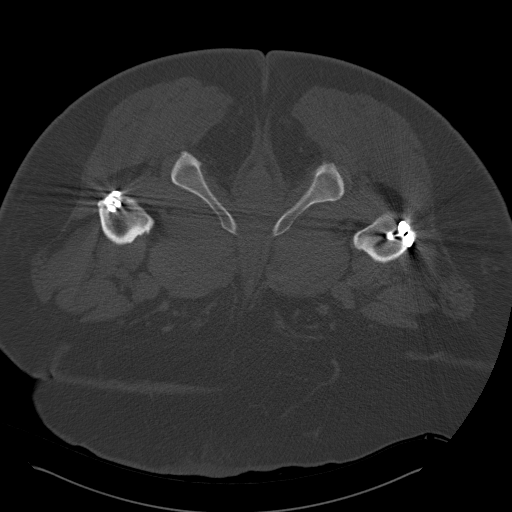
[im 21/101  soft-tissue]
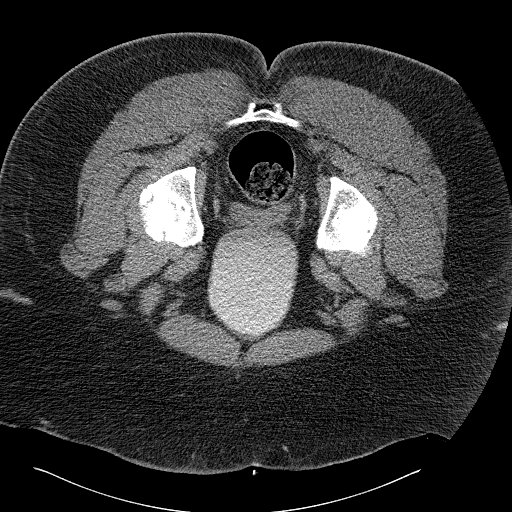
[im 27/101  soft-tissue]
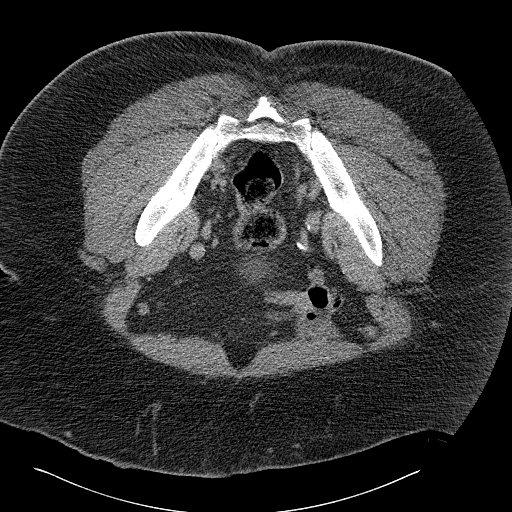
[im 34/101  soft-tissue]
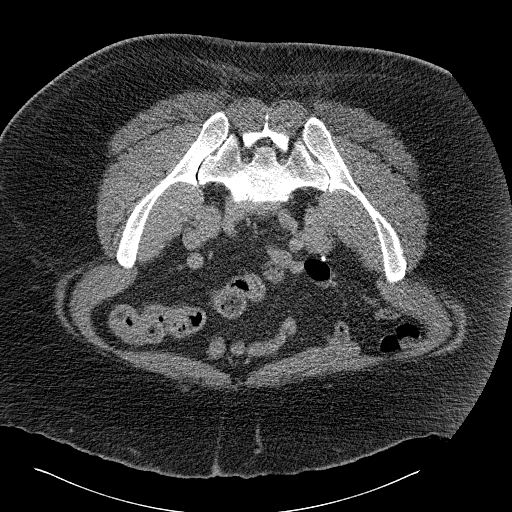
[im 47/101  soft-tissue]
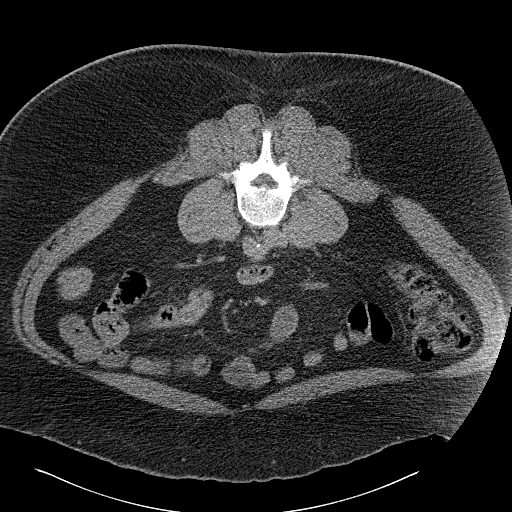
[im 54/101  soft-tissue]
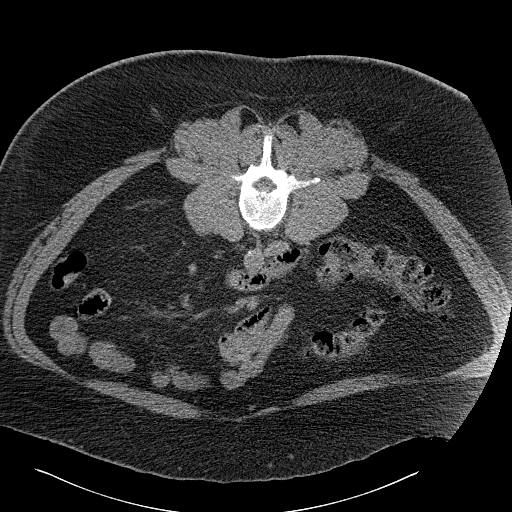
[im 67/101  soft-tissue]
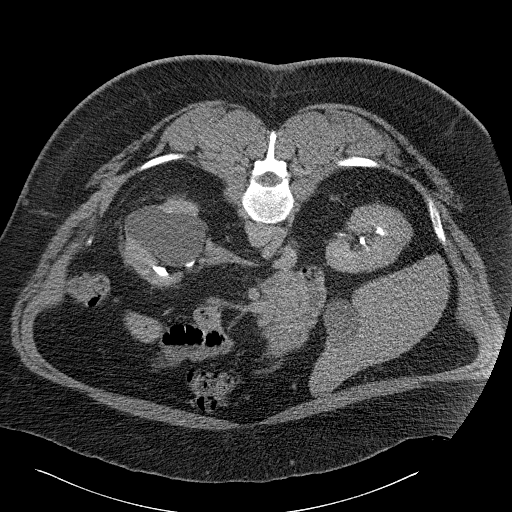
[im 74/101  soft-tissue]
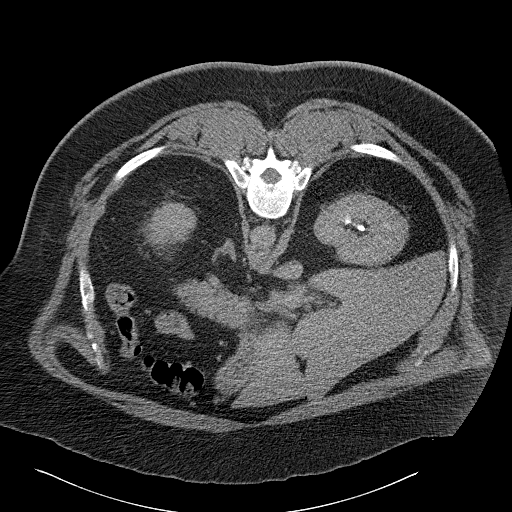
[im 74/101  lung]
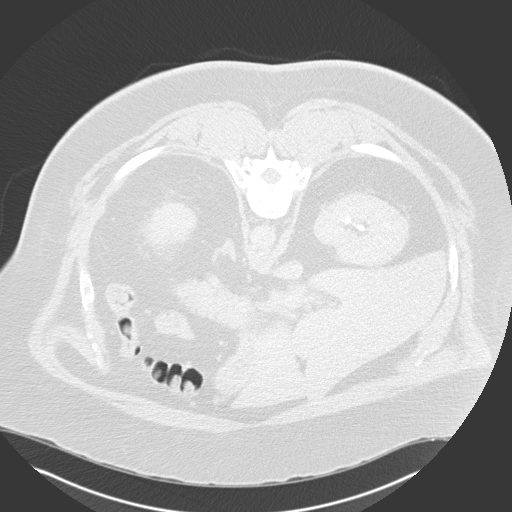
[im 81/101  soft-tissue]
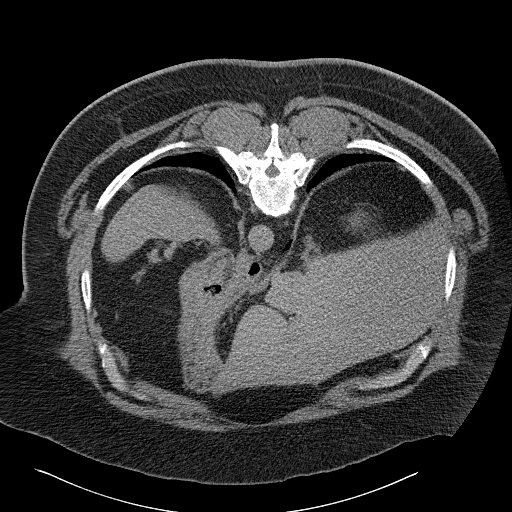
[im 81/101  lung]
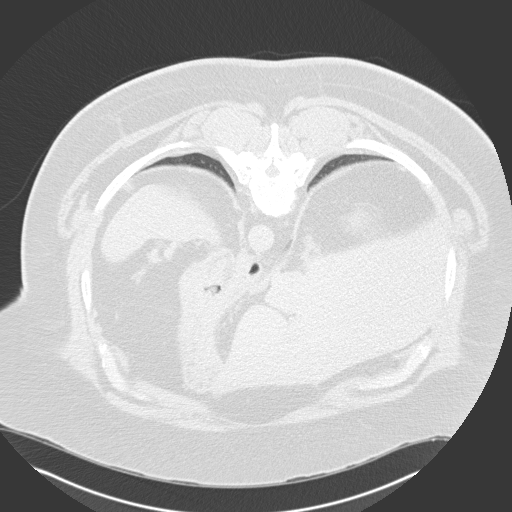
[im 81/101  bone]
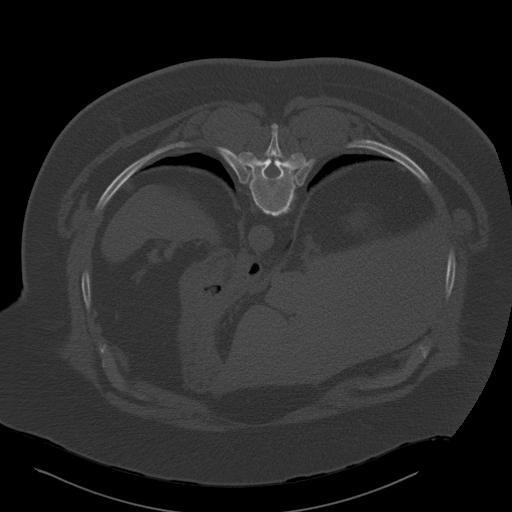
[im 87/101  lung]
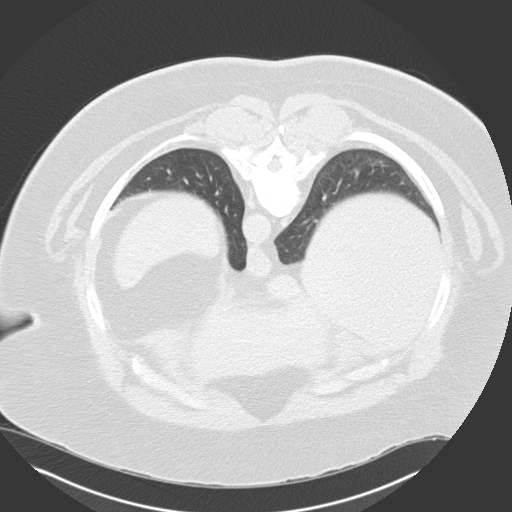
[im 94/101  soft-tissue]
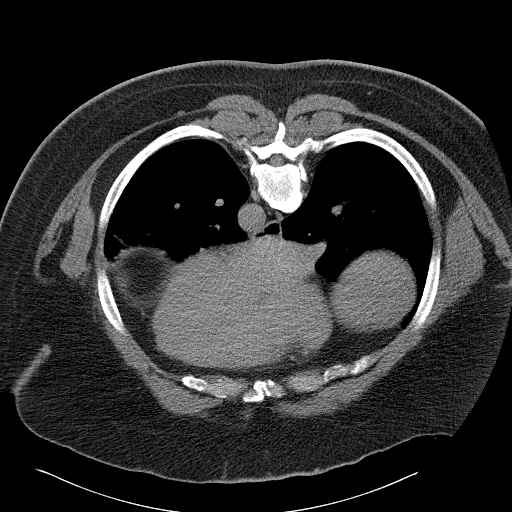
[im 94/101  lung]
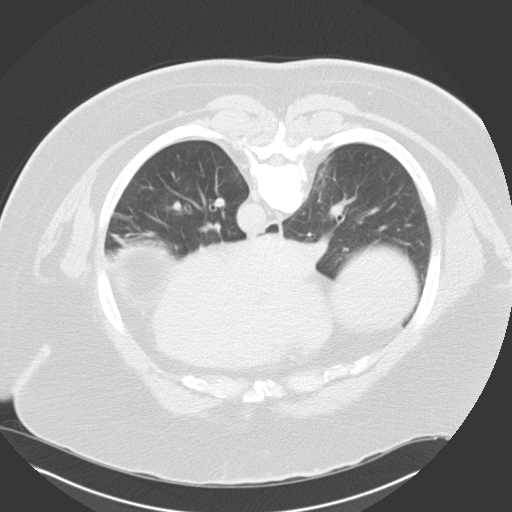

[Series 603: 100 sec. coronals · coronal · 0.90mm/px · 3 of 112 slices shown]
[im 28/112  soft-tissue]
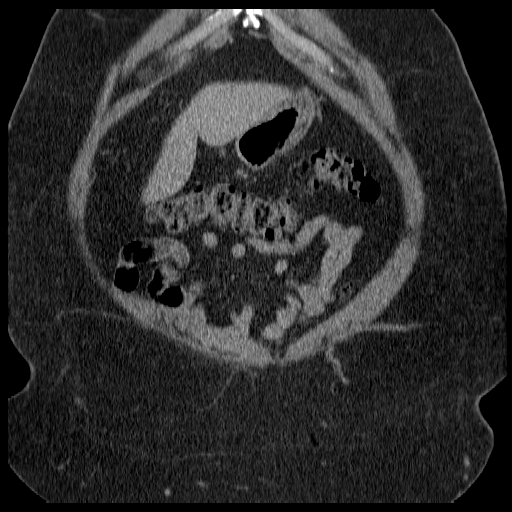
[im 56/112  soft-tissue]
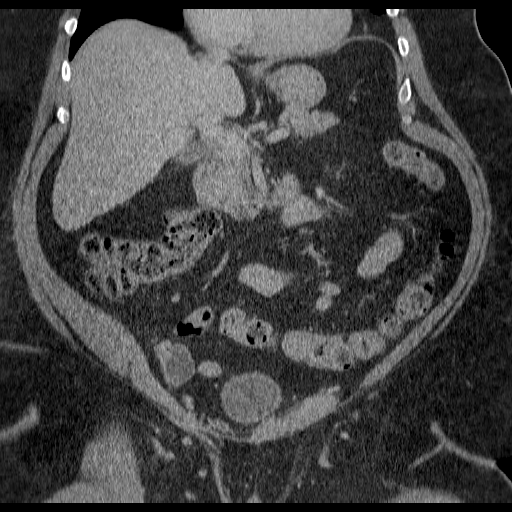
[im 84/112  soft-tissue]
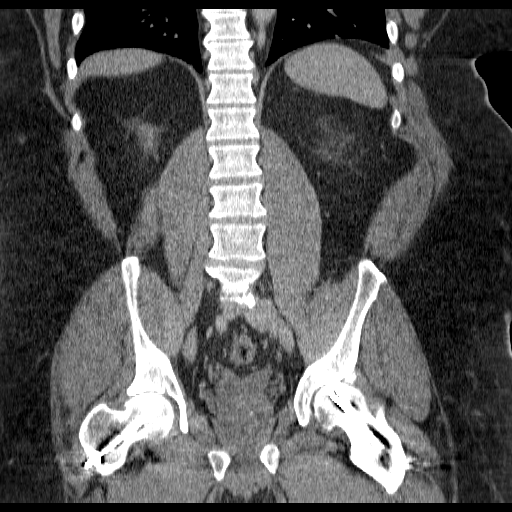

[14 of 46 positions shown; findings below may reference images not displayed]

FINDINGS: Renal:  Cortical phase imaging demonstrates an irregular cyst
within the central left kidney measuring 4.5 x 6.5 cm.  There is no
evidence of enhancement on the postcontrast exams consistent with a
benign cyst.  There is no enhancing renal cortical lesion on the
left or right.  On the delayed pyelogram phase imaging, there are
no filling defects within the collecting systems or ureters.  The
entirety of the ureters are not opacified.   No hydroureter.

There is again demonstrated mild perinephric stranding on the left
without clear etiology.

There is a nonenhancing cyst in the lower pole of the right kidney
measuring 18 mm.

Lung bases are clear.  No pericardial fluid.  No focal hepatic
lesion.  The gallbladder, pancreas, spleen, adrenal glands are
normal.  The stomach, small bowel, colon are normal.  Abdominal
aorta normal caliber.  No retroperitoneal periportal
lymphadenopathy.

No free fluid the pelvis.  Bladder and  prostate gland are normal.
No pelvic lymphadenopathy.

Review of the bone windows demonstrates bilateral hips fixation.
No aggressive osseous lesions.  There is degenerative spurring of
the spine.
IMPRESSION: 1..  No explanation for hematuria or flank pain.
2.  Bilateral nonenhancing renal cysts are most consistent with
Bosniak I and II simple cysts.

## 2014-03-20 DIAGNOSIS — E782 Mixed hyperlipidemia: Secondary | ICD-10-CM | POA: Diagnosis not present

## 2014-03-20 DIAGNOSIS — Z8679 Personal history of other diseases of the circulatory system: Secondary | ICD-10-CM | POA: Diagnosis not present

## 2014-03-20 DIAGNOSIS — N183 Chronic kidney disease, stage 3 (moderate): Secondary | ICD-10-CM | POA: Diagnosis not present

## 2014-04-07 DIAGNOSIS — I119 Hypertensive heart disease without heart failure: Secondary | ICD-10-CM | POA: Diagnosis not present

## 2014-04-07 DIAGNOSIS — E559 Vitamin D deficiency, unspecified: Secondary | ICD-10-CM | POA: Diagnosis not present

## 2014-04-07 DIAGNOSIS — I480 Paroxysmal atrial fibrillation: Secondary | ICD-10-CM | POA: Diagnosis not present

## 2014-04-07 DIAGNOSIS — Z7901 Long term (current) use of anticoagulants: Secondary | ICD-10-CM | POA: Diagnosis not present

## 2014-06-24 DIAGNOSIS — Z7901 Long term (current) use of anticoagulants: Secondary | ICD-10-CM | POA: Diagnosis not present

## 2014-06-24 DIAGNOSIS — R05 Cough: Secondary | ICD-10-CM | POA: Diagnosis not present

## 2014-06-24 DIAGNOSIS — R509 Fever, unspecified: Secondary | ICD-10-CM | POA: Diagnosis not present

## 2014-06-24 DIAGNOSIS — I480 Paroxysmal atrial fibrillation: Secondary | ICD-10-CM | POA: Diagnosis not present

## 2014-11-23 ENCOUNTER — Ambulatory Visit
Admission: RE | Admit: 2014-11-23 | Discharge: 2014-11-23 | Disposition: A | Discharge status: Home / Self Care | Payer: 59 | Source: Ambulatory Visit | Attending: Internal Medicine | Admitting: Internal Medicine

## 2014-11-23 ENCOUNTER — Other Ambulatory Visit: Payer: Self-pay | Admitting: Internal Medicine

## 2014-11-23 DIAGNOSIS — R059 Cough, unspecified: Secondary | ICD-10-CM

## 2014-11-23 DIAGNOSIS — R05 Cough: Secondary | ICD-10-CM

## 2014-11-30 ENCOUNTER — Other Ambulatory Visit: Payer: Self-pay | Admitting: Internal Medicine

## 2014-11-30 ENCOUNTER — Ambulatory Visit
Admission: RE | Admit: 2014-11-30 | Discharge: 2014-11-30 | Disposition: A | Discharge status: Home / Self Care | Payer: 59 | Source: Ambulatory Visit | Attending: Internal Medicine | Admitting: Internal Medicine

## 2014-11-30 DIAGNOSIS — Z09 Encounter for follow-up examination after completed treatment for conditions other than malignant neoplasm: Secondary | ICD-10-CM

## 2014-11-30 DIAGNOSIS — J984 Other disorders of lung: Secondary | ICD-10-CM | POA: Diagnosis not present

## 2015-03-29 DIAGNOSIS — Z7901 Long term (current) use of anticoagulants: Secondary | ICD-10-CM | POA: Diagnosis not present

## 2015-03-29 DIAGNOSIS — M109 Gout, unspecified: Secondary | ICD-10-CM | POA: Diagnosis not present

## 2015-03-29 DIAGNOSIS — I119 Hypertensive heart disease without heart failure: Secondary | ICD-10-CM | POA: Diagnosis not present

## 2015-03-29 DIAGNOSIS — M25552 Pain in left hip: Secondary | ICD-10-CM | POA: Diagnosis not present

## 2015-03-29 DIAGNOSIS — I480 Paroxysmal atrial fibrillation: Secondary | ICD-10-CM | POA: Diagnosis not present

## 2015-04-23 DIAGNOSIS — Z7901 Long term (current) use of anticoagulants: Secondary | ICD-10-CM | POA: Diagnosis not present

## 2015-04-23 DIAGNOSIS — I480 Paroxysmal atrial fibrillation: Secondary | ICD-10-CM | POA: Diagnosis not present

## 2015-04-27 DIAGNOSIS — J028 Acute pharyngitis due to other specified organisms: Secondary | ICD-10-CM | POA: Diagnosis not present

## 2015-05-07 DIAGNOSIS — G4733 Obstructive sleep apnea (adult) (pediatric): Secondary | ICD-10-CM | POA: Diagnosis not present

## 2015-05-25 DIAGNOSIS — I480 Paroxysmal atrial fibrillation: Secondary | ICD-10-CM | POA: Diagnosis not present

## 2015-05-25 DIAGNOSIS — M79602 Pain in left arm: Secondary | ICD-10-CM | POA: Diagnosis not present

## 2015-05-25 DIAGNOSIS — I119 Hypertensive heart disease without heart failure: Secondary | ICD-10-CM | POA: Diagnosis not present

## 2015-05-25 DIAGNOSIS — Z7901 Long term (current) use of anticoagulants: Secondary | ICD-10-CM | POA: Diagnosis not present

## 2015-06-04 DIAGNOSIS — G4733 Obstructive sleep apnea (adult) (pediatric): Secondary | ICD-10-CM | POA: Diagnosis not present

## 2015-06-07 DIAGNOSIS — G4733 Obstructive sleep apnea (adult) (pediatric): Secondary | ICD-10-CM | POA: Diagnosis not present

## 2015-06-17 ENCOUNTER — Ambulatory Visit
Admission: RE | Admit: 2015-06-17 | Discharge: 2015-06-17 | Disposition: A | Discharge status: Home / Self Care | Payer: 59 | Source: Ambulatory Visit | Attending: Internal Medicine | Admitting: Internal Medicine

## 2015-06-17 ENCOUNTER — Other Ambulatory Visit: Payer: Self-pay | Admitting: Internal Medicine

## 2015-06-17 DIAGNOSIS — M79602 Pain in left arm: Secondary | ICD-10-CM | POA: Diagnosis not present

## 2015-06-17 DIAGNOSIS — Z7901 Long term (current) use of anticoagulants: Secondary | ICD-10-CM | POA: Diagnosis not present

## 2015-06-17 DIAGNOSIS — M25512 Pain in left shoulder: Secondary | ICD-10-CM

## 2015-06-17 DIAGNOSIS — I480 Paroxysmal atrial fibrillation: Secondary | ICD-10-CM | POA: Diagnosis not present

## 2015-06-17 DIAGNOSIS — R05 Cough: Secondary | ICD-10-CM | POA: Diagnosis not present

## 2015-06-18 DIAGNOSIS — Z8679 Personal history of other diseases of the circulatory system: Secondary | ICD-10-CM | POA: Diagnosis not present

## 2015-06-18 DIAGNOSIS — N183 Chronic kidney disease, stage 3 (moderate): Secondary | ICD-10-CM | POA: Diagnosis not present

## 2015-06-18 DIAGNOSIS — E782 Mixed hyperlipidemia: Secondary | ICD-10-CM | POA: Diagnosis not present

## 2015-06-25 DIAGNOSIS — J111 Influenza due to unidentified influenza virus with other respiratory manifestations: Secondary | ICD-10-CM | POA: Diagnosis not present

## 2015-07-01 ENCOUNTER — Ambulatory Visit
Admission: RE | Admit: 2015-07-01 | Discharge: 2015-07-01 | Disposition: A | Discharge status: Home / Self Care | Payer: Medicare Other | Source: Ambulatory Visit | Attending: Internal Medicine | Admitting: Internal Medicine

## 2015-07-01 ENCOUNTER — Other Ambulatory Visit: Payer: Self-pay | Admitting: Internal Medicine

## 2015-07-01 DIAGNOSIS — R05 Cough: Secondary | ICD-10-CM

## 2015-07-01 DIAGNOSIS — R059 Cough, unspecified: Secondary | ICD-10-CM

## 2015-07-05 DIAGNOSIS — G4733 Obstructive sleep apnea (adult) (pediatric): Secondary | ICD-10-CM | POA: Diagnosis not present

## 2015-07-21 DIAGNOSIS — I480 Paroxysmal atrial fibrillation: Secondary | ICD-10-CM | POA: Diagnosis not present

## 2015-07-21 DIAGNOSIS — Z7901 Long term (current) use of anticoagulants: Secondary | ICD-10-CM | POA: Diagnosis not present

## 2015-08-05 DIAGNOSIS — G4733 Obstructive sleep apnea (adult) (pediatric): Secondary | ICD-10-CM | POA: Diagnosis not present

## 2015-12-28 DIAGNOSIS — E7800 Pure hypercholesterolemia, unspecified: Secondary | ICD-10-CM | POA: Diagnosis not present

## 2015-12-28 DIAGNOSIS — I480 Paroxysmal atrial fibrillation: Secondary | ICD-10-CM | POA: Diagnosis not present

## 2015-12-28 DIAGNOSIS — I119 Hypertensive heart disease without heart failure: Secondary | ICD-10-CM | POA: Diagnosis not present

## 2015-12-28 DIAGNOSIS — Z7901 Long term (current) use of anticoagulants: Secondary | ICD-10-CM | POA: Diagnosis not present

## 2015-12-28 DIAGNOSIS — Z0001 Encounter for general adult medical examination with abnormal findings: Secondary | ICD-10-CM | POA: Diagnosis not present

## 2016-02-08 DIAGNOSIS — E782 Mixed hyperlipidemia: Secondary | ICD-10-CM | POA: Diagnosis not present

## 2016-02-08 DIAGNOSIS — Z8679 Personal history of other diseases of the circulatory system: Secondary | ICD-10-CM | POA: Diagnosis not present

## 2016-02-08 DIAGNOSIS — I10 Essential (primary) hypertension: Secondary | ICD-10-CM | POA: Diagnosis not present

## 2016-03-08 DIAGNOSIS — Z7901 Long term (current) use of anticoagulants: Secondary | ICD-10-CM | POA: Diagnosis not present

## 2016-03-08 DIAGNOSIS — I480 Paroxysmal atrial fibrillation: Secondary | ICD-10-CM | POA: Diagnosis not present

## 2016-03-17 DIAGNOSIS — J00 Acute nasopharyngitis [common cold]: Secondary | ICD-10-CM | POA: Diagnosis not present

## 2016-03-21 DIAGNOSIS — K1379 Other lesions of oral mucosa: Secondary | ICD-10-CM | POA: Diagnosis not present

## 2016-03-31 DIAGNOSIS — E7800 Pure hypercholesterolemia, unspecified: Secondary | ICD-10-CM | POA: Diagnosis not present

## 2016-03-31 DIAGNOSIS — Z0001 Encounter for general adult medical examination with abnormal findings: Secondary | ICD-10-CM | POA: Diagnosis not present

## 2016-03-31 DIAGNOSIS — I119 Hypertensive heart disease without heart failure: Secondary | ICD-10-CM | POA: Diagnosis not present

## 2016-03-31 DIAGNOSIS — Z7901 Long term (current) use of anticoagulants: Secondary | ICD-10-CM | POA: Diagnosis not present

## 2016-03-31 DIAGNOSIS — I480 Paroxysmal atrial fibrillation: Secondary | ICD-10-CM | POA: Diagnosis not present

## 2016-03-31 DIAGNOSIS — R05 Cough: Secondary | ICD-10-CM | POA: Diagnosis not present

## 2016-05-04 DIAGNOSIS — E7800 Pure hypercholesterolemia, unspecified: Secondary | ICD-10-CM | POA: Diagnosis not present

## 2016-05-04 DIAGNOSIS — I480 Paroxysmal atrial fibrillation: Secondary | ICD-10-CM | POA: Diagnosis not present

## 2016-05-04 DIAGNOSIS — Z7901 Long term (current) use of anticoagulants: Secondary | ICD-10-CM | POA: Diagnosis not present

## 2016-05-07 ENCOUNTER — Inpatient Hospital Stay (HOSPITAL_COMMUNITY)
Admission: EM | Admit: 2016-05-07 | Discharge: 2016-05-11 | DRG: 872 | Disposition: A | Discharge status: Home / Self Care | Payer: Medicare Other | Attending: Internal Medicine | Admitting: Internal Medicine

## 2016-05-07 ENCOUNTER — Encounter (HOSPITAL_COMMUNITY): Payer: Self-pay | Admitting: Emergency Medicine

## 2016-05-07 ENCOUNTER — Emergency Department (HOSPITAL_COMMUNITY): Payer: Medicare Other

## 2016-05-07 DIAGNOSIS — A419 Sepsis, unspecified organism: Secondary | ICD-10-CM | POA: Diagnosis not present

## 2016-05-07 DIAGNOSIS — I480 Paroxysmal atrial fibrillation: Secondary | ICD-10-CM | POA: Diagnosis present

## 2016-05-07 DIAGNOSIS — N281 Cyst of kidney, acquired: Secondary | ICD-10-CM | POA: Diagnosis not present

## 2016-05-07 DIAGNOSIS — I481 Persistent atrial fibrillation: Secondary | ICD-10-CM | POA: Diagnosis not present

## 2016-05-07 DIAGNOSIS — I959 Hypotension, unspecified: Secondary | ICD-10-CM

## 2016-05-07 DIAGNOSIS — N179 Acute kidney failure, unspecified: Secondary | ICD-10-CM | POA: Diagnosis present

## 2016-05-07 DIAGNOSIS — Z79899 Other long term (current) drug therapy: Secondary | ICD-10-CM

## 2016-05-07 DIAGNOSIS — Z820 Family history of epilepsy and other diseases of the nervous system: Secondary | ICD-10-CM

## 2016-05-07 DIAGNOSIS — F419 Anxiety disorder, unspecified: Secondary | ICD-10-CM | POA: Diagnosis present

## 2016-05-07 DIAGNOSIS — D696 Thrombocytopenia, unspecified: Secondary | ICD-10-CM | POA: Diagnosis present

## 2016-05-07 DIAGNOSIS — G4733 Obstructive sleep apnea (adult) (pediatric): Secondary | ICD-10-CM | POA: Diagnosis present

## 2016-05-07 DIAGNOSIS — E7800 Pure hypercholesterolemia, unspecified: Secondary | ICD-10-CM | POA: Diagnosis present

## 2016-05-07 DIAGNOSIS — I482 Chronic atrial fibrillation: Secondary | ICD-10-CM | POA: Diagnosis present

## 2016-05-07 DIAGNOSIS — R7881 Bacteremia: Secondary | ICD-10-CM

## 2016-05-07 DIAGNOSIS — I10 Essential (primary) hypertension: Secondary | ICD-10-CM | POA: Diagnosis not present

## 2016-05-07 DIAGNOSIS — I440 Atrioventricular block, first degree: Secondary | ICD-10-CM | POA: Diagnosis present

## 2016-05-07 DIAGNOSIS — Z888 Allergy status to other drugs, medicaments and biological substances status: Secondary | ICD-10-CM

## 2016-05-07 DIAGNOSIS — A4151 Sepsis due to Escherichia coli [E. coli]: Principal | ICD-10-CM | POA: Diagnosis present

## 2016-05-07 DIAGNOSIS — Z6841 Body Mass Index (BMI) 40.0 and over, adult: Secondary | ICD-10-CM

## 2016-05-07 DIAGNOSIS — F329 Major depressive disorder, single episode, unspecified: Secondary | ICD-10-CM | POA: Diagnosis present

## 2016-05-07 DIAGNOSIS — I9589 Other hypotension: Secondary | ICD-10-CM | POA: Diagnosis not present

## 2016-05-07 DIAGNOSIS — I4891 Unspecified atrial fibrillation: Secondary | ICD-10-CM | POA: Diagnosis present

## 2016-05-07 DIAGNOSIS — N289 Disorder of kidney and ureter, unspecified: Secondary | ICD-10-CM

## 2016-05-07 DIAGNOSIS — R0602 Shortness of breath: Secondary | ICD-10-CM | POA: Diagnosis not present

## 2016-05-07 DIAGNOSIS — Z7901 Long term (current) use of anticoagulants: Secondary | ICD-10-CM | POA: Diagnosis not present

## 2016-05-07 DIAGNOSIS — F32A Depression, unspecified: Secondary | ICD-10-CM | POA: Diagnosis present

## 2016-05-07 HISTORY — DX: Essential (primary) hypertension: I10

## 2016-05-07 LAB — CBC WITH DIFFERENTIAL/PLATELET
BASOS ABS: 0 10*3/uL (ref 0.0–0.1)
BASOS PCT: 0 %
EOS ABS: 0 10*3/uL (ref 0.0–0.7)
EOS PCT: 0 %
HCT: 48.8 % (ref 39.0–52.0)
Hemoglobin: 16.2 g/dL (ref 13.0–17.0)
Lymphocytes Relative: 10 %
Lymphs Abs: 1.6 10*3/uL (ref 0.7–4.0)
MCH: 32.7 pg (ref 26.0–34.0)
MCHC: 33.2 g/dL (ref 30.0–36.0)
MCV: 98.4 fL (ref 78.0–100.0)
Monocytes Absolute: 0.7 10*3/uL (ref 0.1–1.0)
Monocytes Relative: 4 %
NEUTROS PCT: 86 %
Neutro Abs: 14.3 10*3/uL — ABNORMAL HIGH (ref 1.7–7.7)
PLATELETS: 129 10*3/uL — AB (ref 150–400)
RBC: 4.96 MIL/uL (ref 4.22–5.81)
RDW: 12.2 % (ref 11.5–15.5)
WBC: 16.7 10*3/uL — ABNORMAL HIGH (ref 4.0–10.5)

## 2016-05-07 LAB — COMPREHENSIVE METABOLIC PANEL
ALK PHOS: 69 U/L (ref 38–126)
ALT: 21 U/L (ref 17–63)
ANION GAP: 11 (ref 5–15)
AST: 23 U/L (ref 15–41)
Albumin: 3.5 g/dL (ref 3.5–5.0)
BILIRUBIN TOTAL: 1.8 mg/dL — AB (ref 0.3–1.2)
BUN: 20 mg/dL (ref 6–20)
CALCIUM: 8.7 mg/dL — AB (ref 8.9–10.3)
CO2: 19 mmol/L — ABNORMAL LOW (ref 22–32)
Chloride: 106 mmol/L (ref 101–111)
Creatinine, Ser: 1.86 mg/dL — ABNORMAL HIGH (ref 0.61–1.24)
GFR calc non Af Amer: 39 mL/min — ABNORMAL LOW (ref >60)
GFR, EST AFRICAN AMERICAN: 45 mL/min — AB (ref >60)
Glucose, Bld: 132 mg/dL — ABNORMAL HIGH (ref 65–99)
POTASSIUM: 4.2 mmol/L (ref 3.5–5.1)
Sodium: 136 mmol/L (ref 135–145)
TOTAL PROTEIN: 7.4 g/dL (ref 6.5–8.1)

## 2016-05-07 LAB — I-STAT CG4 LACTIC ACID, ED
LACTIC ACID, VENOUS: 1.91 mmol/L — AB (ref 0.5–1.9)
LACTIC ACID, VENOUS: 2.19 mmol/L — AB (ref 0.5–1.9)

## 2016-05-07 LAB — INFLUENZA PANEL BY PCR (TYPE A & B)
INFLAPCR: NEGATIVE
INFLBPCR: NEGATIVE

## 2016-05-07 LAB — PROTIME-INR
INR: 2.34
PROTHROMBIN TIME: 26.1 s — AB (ref 11.4–15.2)

## 2016-05-07 MED ORDER — SODIUM CHLORIDE 0.9 % IV BOLUS (SEPSIS): 1000.0000 mL | Freq: Once | INTRAVENOUS | Status: DC

## 2016-05-07 MED ORDER — VANCOMYCIN HCL 10 G IV SOLR
2000.0000 mg | Freq: Once | INTRAVENOUS | Status: AC
  Administered 2016-05-07: 2000 mg via INTRAVENOUS
  Filled 2016-05-07: qty 2000

## 2016-05-07 MED ORDER — SODIUM CHLORIDE 0.9 % IV BOLUS (SEPSIS)
1000.0000 mL | Freq: Once | INTRAVENOUS | Status: AC
Start: 2016-05-07 — End: 2016-05-08
  Administered 2016-05-08: 1000 mL via INTRAVENOUS

## 2016-05-07 MED ORDER — ALBUTEROL SULFATE (2.5 MG/3ML) 0.083% IN NEBU: 5.0000 mg | INHALATION_SOLUTION | Freq: Once | RESPIRATORY_TRACT | Status: DC

## 2016-05-07 MED ORDER — SODIUM CHLORIDE 0.9 % IV BOLUS (SEPSIS)
1000.0000 mL | Freq: Once | INTRAVENOUS | Status: AC
  Administered 2016-05-07: 1000 mL via INTRAVENOUS

## 2016-05-07 MED ORDER — PIPERACILLIN-TAZOBACTAM 3.375 G IVPB 30 MIN
3.3750 g | Freq: Once | INTRAVENOUS | Status: AC
  Administered 2016-05-07: 3.375 g via INTRAVENOUS
  Filled 2016-05-07: qty 50

## 2016-05-07 MED ORDER — SODIUM CHLORIDE 0.9 % IV BOLUS (SEPSIS)
500.0000 mL | Freq: Once | INTRAVENOUS | Status: AC
  Administered 2016-05-08: 500 mL via INTRAVENOUS

## 2016-05-07 MED ORDER — DEXTROSE 5 % IV SOLN
1.0000 g | Freq: Once | INTRAVENOUS | Status: DC
  Filled 2016-05-07: qty 10

## 2016-05-07 MED ORDER — SODIUM CHLORIDE 0.9 % IV BOLUS (SEPSIS)
1000.0000 mL | Freq: Once | INTRAVENOUS | Status: AC
  Administered 2016-05-08: 1000 mL via INTRAVENOUS

## 2016-05-07 MED ORDER — VANCOMYCIN HCL IN DEXTROSE 1-5 GM/200ML-% IV SOLN: 1000.0000 mg | Freq: Once | INTRAVENOUS | Status: DC

## 2016-05-07 NOTE — ED Notes (Signed)
Elevated CG-4 reported to Dr. Jeneen Rinks

## 2016-05-07 NOTE — ED Triage Notes (Signed)
Pt presented w/ a fever of 104.2, very diaphoric, weak and dizzy.  His wife gave him 800mg  of Ibprofen at 7pm before leaving for the hospital.  Began to feel poorly yesterday but "got bad today."

## 2016-05-07 NOTE — ED Provider Notes (Signed)
[facility] DEPT Provider Note   CSN: [id number] Arrival date & time: 05/07/16  2039     History   Chief Complaint Chief Complaint  Patient presents with  . Fever  . Shortness of Breath    HPI Ricky Burton is a 56 y.o. male with a past medical history significant for atrial fibrillation status post ablation, hypercholesterolemia, obesity, and sleep apnea who presents with fever, decrease in urination, fatigue/malaise, and diaphoresis. Patient is committed by family who report that beginning last night in the afternoon, patient began having fevers. They say that the patient has had no cough, congestion, rhinorrhea, contacts, nausea, vomiting, constipation, diarrhea, or abdominal pain. They report the patient has had some diaphoresis, fevers, and chills. Patient denies any dysuria but says that he has had decreased urination. He denies any chest pain, palpitations, or shortness of breath.    The history is provided by the patient, medical records and a relative. No language interpreter was used.  Fever   The current episode started yesterday. The problem occurs constantly. The problem has not changed since onset.The maximum temperature noted was more than 104 F. Pertinent negatives include no chest pain, no diarrhea, no vomiting, no congestion, no headaches and no cough. The treatment provided no relief.    Past Medical History:  Diagnosis Date  . A-fib ([place])   . Anxiety   . Heart disease   . High cholesterol    controlled  . Morbid obesity ([place])   . OSA (obstructive sleep apnea)     Patient Active Problem List   Diagnosis Date Noted  . Morbid obesity with BMI of 50.0-59.9, adult ([place]) 11/07/2012  . MORBID OBESITY 06/01/2010  . ATRIAL FIBRILLATION 06/01/2010  . SLEEP APNEA 06/01/2010    History reviewed. No pertinent surgical history.     Home Medications    Prior to Admission medications   Medication Sig Start Date End Date Taking? Authorizing Provider    allopurinol (ZYLOPRIM) 300 MG tablet Take 300 mg by mouth daily.    Historical Provider, MD  Febuxostat (ULORIC) 80 MG TABS Take by mouth daily.    Historical Provider, MD  FLECAINIDE ACETATE PO Take by mouth 2 (two) times daily.    Historical Provider, MD  PARoxetine (PAXIL) 20 MG tablet Take 20 mg by mouth daily. 1/2 tablet daily    Historical Provider, MD  rosuvastatin (CRESTOR) 10 MG tablet Take 10 mg by mouth daily.    Historical Provider, MD  valsartan (DIOVAN) 320 MG tablet Take 320 mg by mouth daily. 1/2 tablet daily    Historical Provider, MD  verapamil (COVERA HS) 180 MG (CO) 24 hr tablet Take 180 mg by mouth daily.    Historical Provider, MD  warfarin (COUMADIN) 5 MG tablet Take 5 mg by mouth daily.    Historical Provider, MD    Family History Family History  Problem Relation Age of Onset  . Alzheimer's disease Mother     Social History Social History  Substance Use Topics  . Smoking status: Never Smoker  . Smokeless tobacco: Never Used  . Alcohol use No     Allergies   Patient has no known allergies.   Review of Systems Review of Systems  Constitutional: Positive for chills, diaphoresis, fatigue and fever.  HENT: Negative for congestion and rhinorrhea.   Eyes: Negative for visual disturbance.  Respiratory: Negative for cough, chest tightness, shortness of breath, wheezing and stridor.   Cardiovascular: Negative for chest pain, palpitations and leg swelling.  Gastrointestinal: Negative for abdominal pain, diarrhea and vomiting.  Genitourinary: Positive for decreased urine volume. Negative for dysuria and frequency.  Musculoskeletal: Negative for back pain, neck pain and neck stiffness.  Skin: Negative for rash and wound.  Neurological: Negative for headaches.  Psychiatric/Behavioral: Negative for agitation and confusion.  All other systems reviewed and are negative.    Physical Exam Updated Vital Signs BP 108/66 (BP Location: Left Arm)   Pulse 97   Temp  (!) 104.2 F (40.1 C) (Oral)   Ht 5\' 9"  (1.753 m)   Wt 300 lb (136.1 kg)   SpO2 96%   BMI 44.30 kg/m   Physical Exam  Constitutional: He appears well-developed and well-nourished. No distress.  HENT:  Head: Normocephalic and atraumatic.  Mouth/Throat: Oropharynx is clear and moist.  Eyes: Conjunctivae and EOM are normal. Pupils are equal, round, and reactive to light.  Neck: Neck supple.  Cardiovascular: Normal rate, regular rhythm, normal heart sounds and intact distal pulses.   No murmur heard. Pulmonary/Chest: Effort normal and breath sounds normal. No stridor. No respiratory distress. He has no wheezes. He has no rales. He exhibits no tenderness.  Abdominal: Soft. There is no tenderness.  Musculoskeletal: He exhibits no edema or tenderness.  Neurological: He is alert. No sensory deficit.  Skin: Skin is warm. Capillary refill takes less than 2 seconds. He is diaphoretic. No erythema. No pallor.  Psychiatric: He has a normal mood and affect.  Nursing note and vitals reviewed.    ED Treatments / Results  Labs (all labs ordered are listed, but only abnormal results are displayed) Labs Reviewed  COMPREHENSIVE METABOLIC PANEL - Abnormal; Notable for the following:       Result Value   CO2 19 (*)    Glucose, Bld 132 (*)    Creatinine, Ser 1.86 (*)    Calcium 8.7 (*)    Total Bilirubin 1.8 (*)    GFR calc non Af Amer 39 (*)    GFR calc Af Amer 45 (*)    All other components within normal limits  CBC WITH DIFFERENTIAL/PLATELET - Abnormal; Notable for the following:    WBC 16.7 (*)    Platelets 129 (*)    Neutro Abs 14.3 (*)    All other components within normal limits  PROTIME-INR - Abnormal; Notable for the following:    Prothrombin Time 26.1 (*)    All other components within normal limits  I-STAT CG4 LACTIC ACID, ED - Abnormal; Notable for the following:    Lactic Acid, Venous 2.19 (*)    All other components within normal limits  I-STAT CG4 LACTIC ACID, ED -  Abnormal; Notable for the following:    Lactic Acid, Venous 1.91 (*)    All other components within normal limits  CULTURE, BLOOD (ROUTINE X 2)  CULTURE, BLOOD (ROUTINE X 2)  URINE CULTURE  INFLUENZA PANEL BY PCR (TYPE A & B)  URINALYSIS, ROUTINE W REFLEX MICROSCOPIC  CBC WITH DIFFERENTIAL/PLATELET  LACTIC ACID, PLASMA  LACTIC ACID, PLASMA  PROCALCITONIN  BASIC METABOLIC PANEL  I-STAT CG4 LACTIC ACID, ED    EKG  EKG Interpretation None       Radiology Dg Chest Portable 1 View  Result Date: 05/07/2016 CLINICAL DATA:  Shortness of breath and fever, sepsis. EXAM: PORTABLE CHEST 1 VIEW COMPARISON:  07/01/2015 FINDINGS: Lungs are adequately inflated without focal consolidation or effusion. Cardiomediastinal silhouette is within normal. There is mild degenerate change of the spine. IMPRESSION: No active disease. Electronically Signed  By: Marin Olp M.D.   On: 05/07/2016 22:13    Procedures Procedures (including critical care time)  CRITICAL CARE Performed by: Gwenyth Allegra Adonna Horsley Total critical care time: 45 minutes Critical care time was exclusive of separately billable procedures and treating other patients. Critical care was necessary to treat or prevent imminent or life-threatening deterioration. Critical care was time spent personally by me on the following activities: development of treatment plan with patient and/or surrogate as well as nursing, discussions with consultants, evaluation of patient's response to treatment, examination of patient, obtaining history from patient or surrogate, ordering and performing treatments and interventions, ordering and review of laboratory studies, ordering and review of radiographic studies, pulse oximetry and re-evaluation of patient's condition.   Medications Ordered in ED Medications  sodium chloride 0.9 % bolus 1,000 mL (1,000 mLs Intravenous New Bag/Given 05/07/16 2243)    And  sodium chloride 0.9 % bolus 1,000 mL (1,000 mLs  Intravenous New Bag/Given 05/07/16 2244)    And  sodium chloride 0.9 % bolus 1,000 mL (not administered)    And  sodium chloride 0.9 % bolus 1,000 mL (not administered)    And  sodium chloride 0.9 % bolus 500 mL (not administered)  vancomycin (VANCOCIN) 2,000 mg in sodium chloride 0.9 % 500 mL IVPB (2,000 mg Intravenous New Bag/Given 05/07/16 2315)  flecainide (TAMBOCOR) tablet 100 mg (not administered)  PARoxetine (PAXIL) tablet 5 mg (not administered)  rosuvastatin (CRESTOR) tablet 10 mg (not administered)  verapamil (CALAN-SR) CR tablet 180 mg (not administered)  sodium chloride flush (NS) 0.9 % injection 3 mL (not administered)  0.9 %  sodium chloride infusion (not administered)  acetaminophen (TYLENOL) tablet 650 mg (not administered)    Or  acetaminophen (TYLENOL) suppository 650 mg (not administered)  HYDROcodone-acetaminophen (NORCO/VICODIN) 5-325 MG per tablet 1-2 tablet (not administered)  polyethylene glycol (MIRALAX / GLYCOLAX) packet 17 g (not administered)  bisacodyl (DULCOLAX) EC tablet 5 mg (not administered)  ondansetron (ZOFRAN) tablet 4 mg (not administered)    Or  ondansetron (ZOFRAN) injection 4 mg (not administered)  oseltamivir (TAMIFLU) capsule 30 mg (not administered)  vancomycin (VANCOCIN) 1,500 mg in sodium chloride 0.9 % 500 mL IVPB (not administered)  piperacillin-tazobactam (ZOSYN) IVPB 3.375 g (not administered)  piperacillin-tazobactam (ZOSYN) IVPB 3.375 g (0 g Intravenous Stopped 05/07/16 2318)     Initial Impression / Assessment and Plan / ED Course  I have reviewed the triage vital signs and the nursing notes.  Pertinent labs & imaging results that were available during my care of the patient were reviewed by me and considered in my medical decision making (see chart for details).     Ricky Burton is a 56 y.o. male with a past medical history significant for atrial fibrillation status post ablation, hypercholesterolemia, obesity, and sleep  apnea who presents with fever, decrease in urination, fatigue/malaise, and diaphoresis  On initial assessment, patient found to be diaphoretic, febrile, hypotensive, and tachycardic. Patient was quickly made a code sepsis given the hypotension and concern for infection.  History and exam are seen above. Patient had clear lungs. Abdomen nontender. On exam, only abnormality was the diaphoresis. Given the patient's reported decreased urination, suspect a urinary source versus a viral source.  Patient given broad-spectrum antibiotics, cultures were obtained, and urine cultures were ordered.   Patient reported feeling much better after fluids. Patient's blood pressure improved from the 0000000 systolic to the [id number] systolic [id number] systolic. Patient had resolution of diaphoresis.  Laboratory testing returned showing above.  Patient had elevated lactic acid of 2.1. After some fluids, this improved to 1.9. White blood cell count was elevated at 16.7 and patient had elevation in his creatinine to 1.6.  Given the code sepsis, patient will be admitted for further management of his hypotension in the setting of infection. Patient's influenza testing negative. Hospitalist team agreed with admission to stepdown unit.       Final Clinical Impressions(s) / ED Diagnoses   Final diagnoses:  Sepsis, due to unspecified organism ([place])  Hypotension, unspecified hypotension type    Clinical Impression: 1. Sepsis, due to unspecified organism ([place])   2. Hypotension, unspecified hypotension type     Disposition: Admit to Hospitalist stepdown service    Courtney Paris, MD 05/08/16 1106

## 2016-05-08 DIAGNOSIS — A419 Sepsis, unspecified organism: Secondary | ICD-10-CM

## 2016-05-08 DIAGNOSIS — N289 Disorder of kidney and ureter, unspecified: Secondary | ICD-10-CM

## 2016-05-08 LAB — URINALYSIS, ROUTINE W REFLEX MICROSCOPIC
BILIRUBIN URINE: NEGATIVE
Bacteria, UA: NONE SEEN
GLUCOSE, UA: NEGATIVE mg/dL
Hgb urine dipstick: NEGATIVE
Ketones, ur: NEGATIVE mg/dL
Nitrite: NEGATIVE
Protein, ur: 30 mg/dL — AB
SPECIFIC GRAVITY, URINE: 1.02 (ref 1.005–1.030)
Squamous Epithelial / LPF: NONE SEEN
pH: 5 (ref 5.0–8.0)

## 2016-05-08 LAB — LACTIC ACID, PLASMA
LACTIC ACID, VENOUS: 1 mmol/L (ref 0.5–1.9)
Lactic Acid, Venous: 1.3 mmol/L (ref 0.5–1.9)
Lactic Acid, Venous: 1.2 mmol/L (ref 0.5–1.9)
Lactic Acid, Venous: 1.3 mmol/L (ref 0.5–1.9)

## 2016-05-08 LAB — BLOOD CULTURE ID PANEL (REFLEXED)
ACINETOBACTER BAUMANNII: NOT DETECTED
CARBAPENEM RESISTANCE: NOT DETECTED
Candida albicans: NOT DETECTED
Candida glabrata: NOT DETECTED
Candida krusei: NOT DETECTED
Candida parapsilosis: NOT DETECTED
Candida tropicalis: NOT DETECTED
ENTEROBACTER CLOACAE COMPLEX: NOT DETECTED
ENTEROBACTERIACEAE SPECIES: DETECTED — AB
ENTEROCOCCUS SPECIES: NOT DETECTED
Escherichia coli: DETECTED — AB
HAEMOPHILUS INFLUENZAE: NOT DETECTED
KLEBSIELLA OXYTOCA: NOT DETECTED
KLEBSIELLA PNEUMONIAE: NOT DETECTED
LISTERIA MONOCYTOGENES: NOT DETECTED
Neisseria meningitidis: NOT DETECTED
PSEUDOMONAS AERUGINOSA: NOT DETECTED
Proteus species: NOT DETECTED
STAPHYLOCOCCUS AUREUS BCID: NOT DETECTED
STREPTOCOCCUS PNEUMONIAE: NOT DETECTED
STREPTOCOCCUS PYOGENES: NOT DETECTED
STREPTOCOCCUS SPECIES: NOT DETECTED
Serratia marcescens: NOT DETECTED
Staphylococcus species: NOT DETECTED
Streptococcus agalactiae: NOT DETECTED

## 2016-05-08 LAB — PROTIME-INR
INR: 2.59
PROTHROMBIN TIME: 28.2 s — AB (ref 11.4–15.2)

## 2016-05-08 LAB — RESPIRATORY PANEL BY PCR
ADENOVIRUS-RVPPCR: NOT DETECTED
BORDETELLA PERTUSSIS-RVPCR: NOT DETECTED
CORONAVIRUS 229E-RVPPCR: NOT DETECTED
CORONAVIRUS HKU1-RVPPCR: NOT DETECTED
CORONAVIRUS NL63-RVPPCR: NOT DETECTED
Chlamydophila pneumoniae: NOT DETECTED
Coronavirus OC43: NOT DETECTED
Influenza A: NOT DETECTED
Influenza B: NOT DETECTED
MYCOPLASMA PNEUMONIAE-RVPPCR: NOT DETECTED
Metapneumovirus: NOT DETECTED
Parainfluenza Virus 1: NOT DETECTED
Parainfluenza Virus 2: NOT DETECTED
Parainfluenza Virus 3: NOT DETECTED
Parainfluenza Virus 4: NOT DETECTED
Respiratory Syncytial Virus: NOT DETECTED
Rhinovirus / Enterovirus: NOT DETECTED

## 2016-05-08 LAB — CBC WITH DIFFERENTIAL/PLATELET
BASOS PCT: 0 %
Basophils Absolute: 0 10*3/uL (ref 0.0–0.1)
Eosinophils Absolute: 0 10*3/uL (ref 0.0–0.7)
Eosinophils Relative: 0 %
HEMATOCRIT: 42.2 % (ref 39.0–52.0)
HEMOGLOBIN: 13.8 g/dL (ref 13.0–17.0)
Lymphocytes Relative: 7 %
Lymphs Abs: 1.4 10*3/uL (ref 0.7–4.0)
MCH: 32.5 pg (ref 26.0–34.0)
MCHC: 32.7 g/dL (ref 30.0–36.0)
MCV: 99.3 fL (ref 78.0–100.0)
MONOS PCT: 6 %
Monocytes Absolute: 1.1 10*3/uL — ABNORMAL HIGH (ref 0.1–1.0)
NEUTROS ABS: 17 10*3/uL — AB (ref 1.7–7.7)
NEUTROS PCT: 87 %
Platelets: 117 10*3/uL — ABNORMAL LOW (ref 150–400)
RBC: 4.25 MIL/uL (ref 4.22–5.81)
RDW: 12.4 % (ref 11.5–15.5)
WBC: 19.5 10*3/uL — ABNORMAL HIGH (ref 4.0–10.5)

## 2016-05-08 LAB — GLUCOSE, CAPILLARY: GLUCOSE-CAPILLARY: 86 mg/dL (ref 65–99)

## 2016-05-08 LAB — BASIC METABOLIC PANEL
ANION GAP: 6 (ref 5–15)
BUN: 17 mg/dL (ref 6–20)
CO2: 23 mmol/L (ref 22–32)
Calcium: 7.9 mg/dL — ABNORMAL LOW (ref 8.9–10.3)
Chloride: 110 mmol/L (ref 101–111)
Creatinine, Ser: 1.84 mg/dL — ABNORMAL HIGH (ref 0.61–1.24)
GFR, EST AFRICAN AMERICAN: 45 mL/min — AB (ref >60)
GFR, EST NON AFRICAN AMERICAN: 39 mL/min — AB (ref >60)
Glucose, Bld: 124 mg/dL — ABNORMAL HIGH (ref 65–99)
Potassium: 5 mmol/L (ref 3.5–5.1)
SODIUM: 139 mmol/L (ref 135–145)

## 2016-05-08 LAB — PROCALCITONIN: PROCALCITONIN: 3.59 ng/mL

## 2016-05-08 LAB — MRSA PCR SCREENING: MRSA BY PCR: NEGATIVE

## 2016-05-08 MED ORDER — WARFARIN SODIUM 5 MG PO TABS
5.0000 mg | ORAL_TABLET | Freq: Once | ORAL | Status: AC
Start: 2016-05-08 — End: 2016-05-08
  Administered 2016-05-08: 5 mg via ORAL
  Filled 2016-05-08: qty 1

## 2016-05-08 MED ORDER — SODIUM CHLORIDE 0.9 % IV SOLN
INTRAVENOUS | Status: AC
  Administered 2016-05-08: 16:00:00 via INTRAVENOUS

## 2016-05-08 MED ORDER — POLYETHYLENE GLYCOL 3350 17 G PO PACK: 17.0000 g | PACK | Freq: Every day | ORAL | Status: DC | PRN

## 2016-05-08 MED ORDER — ONDANSETRON HCL 4 MG PO TABS: 4.0000 mg | ORAL_TABLET | Freq: Four times a day (QID) | ORAL | Status: DC | PRN

## 2016-05-08 MED ORDER — ROSUVASTATIN CALCIUM 10 MG PO TABS
10.0000 mg | ORAL_TABLET | Freq: Every day | ORAL | Status: DC
  Administered 2016-05-08 – 2016-05-10 (×3): 10 mg via ORAL
  Filled 2016-05-08 (×3): qty 1

## 2016-05-08 MED ORDER — HYDROCODONE-ACETAMINOPHEN 5-325 MG PO TABS: 1.0000 | ORAL_TABLET | ORAL | Status: DC | PRN

## 2016-05-08 MED ORDER — SODIUM CHLORIDE 0.9 % IV SOLN
INTRAVENOUS | Status: AC
  Administered 2016-05-08: 03:00:00 via INTRAVENOUS

## 2016-05-08 MED ORDER — WARFARIN - PHARMACIST DOSING INPATIENT: Freq: Every day | Status: DC

## 2016-05-08 MED ORDER — SODIUM CHLORIDE 0.9% FLUSH
3.0000 mL | Freq: Two times a day (BID) | INTRAVENOUS | Status: DC
  Administered 2016-05-08 – 2016-05-11 (×6): 3 mL via INTRAVENOUS

## 2016-05-08 MED ORDER — VERAPAMIL HCL ER 180 MG PO TBCR
180.0000 mg | EXTENDED_RELEASE_TABLET | Freq: Every day | ORAL | Status: DC
  Administered 2016-05-08 – 2016-05-10 (×3): 180 mg via ORAL
  Filled 2016-05-08 (×3): qty 1

## 2016-05-08 MED ORDER — FLECAINIDE ACETATE 100 MG PO TABS
100.0000 mg | ORAL_TABLET | Freq: Two times a day (BID) | ORAL | Status: DC
  Administered 2016-05-08 – 2016-05-10 (×5): 100 mg via ORAL
  Filled 2016-05-08 (×7): qty 1

## 2016-05-08 MED ORDER — ACETAMINOPHEN 325 MG PO TABS
650.0000 mg | ORAL_TABLET | ORAL | Status: DC | PRN
  Administered 2016-05-08 – 2016-05-09 (×8): 650 mg via ORAL
  Filled 2016-05-08 (×8): qty 2

## 2016-05-08 MED ORDER — VANCOMYCIN HCL 10 G IV SOLR: 1500.0000 mg | INTRAVENOUS | Status: DC

## 2016-05-08 MED ORDER — PIPERACILLIN-TAZOBACTAM 3.375 G IVPB
3.3750 g | Freq: Three times a day (TID) | INTRAVENOUS | Status: DC
  Administered 2016-05-08 – 2016-05-10 (×7): 3.375 g via INTRAVENOUS
  Filled 2016-05-08 (×9): qty 50

## 2016-05-08 MED ORDER — IBUPROFEN 200 MG PO TABS: 600.0000 mg | ORAL_TABLET | Freq: Once | ORAL | Status: DC

## 2016-05-08 MED ORDER — WARFARIN SODIUM 5 MG PO TABS
5.0000 mg | ORAL_TABLET | Freq: Every day | ORAL | Status: DC
  Administered 2016-05-08: 5 mg via ORAL
  Filled 2016-05-08 (×2): qty 1

## 2016-05-08 MED ORDER — OSELTAMIVIR PHOSPHATE 30 MG PO CAPS
30.0000 mg | ORAL_CAPSULE | Freq: Once | ORAL | Status: DC
  Filled 2016-05-08: qty 1

## 2016-05-08 MED ORDER — IBUPROFEN 200 MG PO TABS
600.0000 mg | ORAL_TABLET | Freq: Once | ORAL | Status: AC
  Administered 2016-05-08: 600 mg via ORAL
  Filled 2016-05-08: qty 3

## 2016-05-08 MED ORDER — ACETAMINOPHEN 325 MG PO TABS
650.0000 mg | ORAL_TABLET | Freq: Four times a day (QID) | ORAL | Status: DC | PRN
  Administered 2016-05-08: 650 mg via ORAL
  Filled 2016-05-08: qty 2

## 2016-05-08 MED ORDER — BISACODYL 5 MG PO TBEC
5.0000 mg | DELAYED_RELEASE_TABLET | Freq: Every day | ORAL | Status: DC | PRN
Start: 2016-05-08 — End: 2016-05-11
  Filled 2016-05-08: qty 1

## 2016-05-08 MED ORDER — ACETAMINOPHEN 650 MG RE SUPP: 650.0000 mg | Freq: Four times a day (QID) | RECTAL | Status: DC | PRN

## 2016-05-08 MED ORDER — ONDANSETRON HCL 4 MG/2ML IJ SOLN: 4.0000 mg | Freq: Four times a day (QID) | INTRAMUSCULAR | Status: DC | PRN

## 2016-05-08 MED ORDER — PAROXETINE HCL 10 MG PO TABS
5.0000 mg | ORAL_TABLET | Freq: Every day | ORAL | Status: DC
  Administered 2016-05-08 – 2016-05-11 (×4): 5 mg via ORAL
  Filled 2016-05-08 (×4): qty 0.5

## 2016-05-08 NOTE — Progress Notes (Signed)
Pharmacy Antibiotic Note  Ricky Burton is a 56 y.o. male admitted on 05/07/2016 with sepsis.  Pharmacy has been consulted for Vancomycin and Zosyn dosing.  Estimated normalized CrCl ~45 ml/min  Plan: Zosyn 3.375gm IV q8h - doses over 4 hours Vancomycin 2gm IV now then 1500mg  IV q24h Will f/u micro data, renal function, and pt's clinical condition Vanc trough prn   Height: 5\' 9"  (175.3 cm) Weight: 300 lb (136.1 kg) IBW/kg (Calculated) : 70.7  Temp (24hrs), Avg:104.2 F (40.1 C), Min:104.2 F (40.1 C), Max:104.2 F (40.1 C)   Recent Labs Lab 05/07/16 2105 05/07/16 2120 05/07/16 2351  WBC 16.7*  --   --   CREATININE 1.86*  --   --   LATICACIDVEN  --  2.19* 1.91*    Estimated Creatinine Clearance: 60.1 mL/min (by C-G formula based on SCr of 1.86 mg/dL (H)).    Allergies  Allergen Reactions  . Advair Diskus [Fluticasone-Salmeterol] Palpitations    Antimicrobials this admission: 1/29 Vanc >>  1/29 Zosyn >>   Dose adjustments this admission: n/a  Microbiology results: 1/28 BCx x2:   UCx:    Thank you for allowing pharmacy to be a part of this patient's care.  Sherlon Handing, PharmD, BCPS Clinical pharmacist, pager [phone] 05/08/2016 12:08 AM

## 2016-05-08 NOTE — Progress Notes (Signed)
PROGRESS NOTE                                                                                                                                                                                                             Patient Demographics:    Ricky Burton, is a 56 y.o. male, DOB - 07-Jun-1960, [id number]  Admit date - 05/07/2016   Admitting Physician Vianne Bulls, MD  Outpatient Primary MD for the patient is Jilda Panda, MD  LOS - 1  Outpatient Specialists:none  Chief Complaint  Patient presents with  . Fever  . Shortness of Breath       Brief Narrative   56 year old morbidly obese male with history of chronic A. fib on Coumadin, depression, hypertension, OSA presented to the ED with fever of almost 104F, chills, diaphoresis and malaise for 1 day duration. In the ED was septic with fever, hypotension and tachypnea. Also had leukocytosis with elevated lactic acid and acute kidney injury. Initial workup including chest x-ray, UA and flu PCR were all negative. Admitted to stepdown unit for sepsis and placed on empiric antibiotics.    Subjective:   Patient having chills with fever  on evaluation this morning. Reports generalized weakness.   Assessment  & Plan :    Principal Problem:   Sepsis ([place]) Continue step down monitoring. No clear etiology. Continue empiric IV vancomycin and Zosyn. Follow blood cultures and respiratory viral panel. Supportive care with Tylenol and IV fluids.  Active Problems:   Atrial fibrillation ([place]) Date controlled. Continue verapamil , flecainide and Coumadin.    Hypotension Secondary to sepsis.. Monitor with IV fluids. Hold valsartan.  Acute kidney injury Possibly due to sepsis. Patient also on valsartan and taking Advil when necessary for pain. Avoid nephrotoxins. Monitor with hydration.   Chronic depression Continue Paxil.    OSA (obstructive sleep  apnea)      Code Status : Full code  Family Communication  : Wife at bedside  Disposition Plan  :Home once improved  Barriers For Discharge : Active sepsis  Consults  :  None  Procedures  : None  DVT Prophylaxis  : Heparin  Lab Results  Component Value Date   PLT 117 (L) 05/08/2016    Antibiotics  :    Anti-infectives    Start     Dose/Rate Route Frequency Ordered  Stop   05/08/16 2200  vancomycin (VANCOCIN) 1,500 mg in sodium chloride 0.9 % 500 mL IVPB     1,500 mg 250 mL/hr over 120 Minutes Intravenous Every 24 hours 05/08/16 0013     05/08/16 0600  piperacillin-tazobactam (ZOSYN) IVPB 3.375 g     3.375 g 12.5 mL/hr over 240 Minutes Intravenous Every 8 hours 05/08/16 0013     05/08/16 0015  oseltamivir (TAMIFLU) capsule 30 mg     30 mg Oral  Once 05/08/16 0002     05/07/16 2300  vancomycin (VANCOCIN) 2,000 mg in sodium chloride 0.9 % 500 mL IVPB     2,000 mg 250 mL/hr over 120 Minutes Intravenous  Once 05/07/16 2239 05/08/16 0115   05/07/16 2245  cefTRIAXone (ROCEPHIN) 1 g in dextrose 5 % 50 mL IVPB  Status:  Discontinued     1 g 100 mL/hr over 30 Minutes Intravenous  Once 05/07/16 2231 05/07/16 2238   05/07/16 2245  piperacillin-tazobactam (ZOSYN) IVPB 3.375 g     3.375 g 100 mL/hr over 30 Minutes Intravenous  Once 05/07/16 2238 05/07/16 2318   05/07/16 2245  vancomycin (VANCOCIN) IVPB 1000 mg/200 mL premix  Status:  Discontinued     1,000 mg 200 mL/hr over 60 Minutes Intravenous  Once 05/07/16 2238 05/07/16 2239        Objective:   Vitals:   05/08/16 0221 05/08/16 0430 05/08/16 0830 05/08/16 0900  BP: 102/68  117/76 (!) 146/102  Pulse: 68  92 92  Resp: (!) 30  20 (!) 22  Temp: 98.5 F (36.9 C) (!) 100.4 F (38 C) (!) 102.1 F (38.9 C)   TempSrc: Oral Oral Oral   SpO2: 100%  97% 96%  Weight: (!) 144.5 kg (318 lb 9 oz)     Height: 5\' 9"  (1.753 m)       Wt Readings from Last 3 Encounters:  05/08/16 (!) 144.5 kg (318 lb 9 oz)  11/07/12 (!) 145.2 kg  (320 lb)  11/06/12 (!) 158.8 kg (350 lb)     Intake/Output Summary (Last 24 hours) at 05/08/16 1124 Last data filed at 05/08/16 0800  Gross per 24 hour  Intake          3560.33 ml  Output                0 ml  Net          3560.33 ml     Physical Exam  [id number] fatigued and shaky  HEENT: no pallor, moist mucosa, supple neck Chest: clear b/l, no added sounds CVS:  S1 and S2 tachycardic, no murmurs rubs or gallop  GI: soft, NT, ND, BS+ Musculoskeletal: warm, no edema [id number] and oriented, nonfocal, no meningeal signs.   Data Review:    CBC  Recent Labs Lab 05/07/16 2105 05/08/16 0314  WBC 16.7* 19.5*  HGB 16.2 13.8  HCT 48.8 42.2  PLT 129* 117*  MCV 98.4 99.3  MCH 32.7 32.5  MCHC 33.2 32.7  RDW 12.2 12.4  LYMPHSABS 1.6 1.4  MONOABS 0.7 1.1*  EOSABS 0.0 0.0  BASOSABS 0.0 0.0    Chemistries   Recent Labs Lab 05/07/16 2105 05/08/16 0314  NA 136 139  K 4.2 5.0  CL 106 110  CO2 19* 23  GLUCOSE 132* 124*  BUN 20 17  CREATININE 1.86* 1.84*  CALCIUM 8.7* 7.9*  AST 23  --   ALT 21  --   ALKPHOS 69  --   BILITOT 1.8*  --    ------------------------------------------------------------------------------------------------------------------  No results for input(s): CHOL, HDL, LDLCALC, TRIG, CHOLHDL, LDLDIRECT in the last 72 hours.  No results found for: HGBA1C ------------------------------------------------------------------------------------------------------------------ No results for input(s): TSH, T4TOTAL, T3FREE, THYROIDAB in the last 72 hours.  Invalid input(s): FREET3 ------------------------------------------------------------------------------------------------------------------ No results for input(s): VITAMINB12, FOLATE, FERRITIN, TIBC, IRON, RETICCTPCT in the last 72 hours.  Coagulation profile  Recent Labs Lab 05/07/16 2105 05/08/16 0314  INR 2.34 2.59    No results for input(s): DDIMER in the last 72 hours.  Cardiac  Enzymes No results for input(s): CKMB, TROPONINI, MYOGLOBIN in the last 168 hours.  Invalid input(s): CK ------------------------------------------------------------------------------------------------------------------ No results found for: BNP  Inpatient Medications  Scheduled Meds: . flecainide  100 mg Oral BID  . oseltamivir  30 mg Oral Once  . PARoxetine  5 mg Oral Daily  . piperacillin-tazobactam (ZOSYN)  IV  3.375 g Intravenous Q8H  . rosuvastatin  10 mg Oral q1800  . sodium chloride flush  3 mL Intravenous Q12H  . vancomycin  1,500 mg Intravenous Q24H  . verapamil  180 mg Oral Daily  . warfarin  5 mg Oral ONCE-1800  . Warfarin - Pharmacist Dosing Inpatient   Does not apply q1800   Continuous Infusions: PRN Meds:.acetaminophen **OR** acetaminophen, bisacodyl, HYDROcodone-acetaminophen, ondansetron **OR** ondansetron (ZOFRAN) IV, polyethylene glycol  Micro Results Recent Results (from the past 240 hour(s))  MRSA PCR Screening     Status: None   Collection Time: 05/08/16  1:42 AM  Result Value Ref Range Status   MRSA by PCR NEGATIVE NEGATIVE Final    Comment:        The GeneXpert MRSA Assay (FDA approved for NASAL specimens only), is one component of a comprehensive MRSA colonization surveillance program. It is not intended to diagnose MRSA infection nor to guide or monitor treatment for MRSA infections.     Radiology Reports Dg Chest Portable 1 View  Result Date: 05/07/2016 CLINICAL DATA:  Shortness of breath and fever, sepsis. EXAM: PORTABLE CHEST 1 VIEW COMPARISON:  07/01/2015 FINDINGS: Lungs are adequately inflated without focal consolidation or effusion. Cardiomediastinal silhouette is within normal. There is mild degenerate change of the spine. IMPRESSION: No active disease. Electronically Signed   By: Marin Olp M.D.   On: 05/07/2016 22:13    Time Spent in minutes  25   Louellen Molder M.D on 05/08/2016 at 11:24 AM  Between 7am to 7pm - Pager -  [phone]  After 7pm go to [email or web address] - password [email or web address]  [facility] -  Office  [phone]

## 2016-05-08 NOTE — H&P (Signed)
History and Physical    Ricky Burton [id number] DOB: [birth year] DOA: 05/07/2016  PCP: Jilda Panda, MD   Patient coming from: Home  Chief Complaint: fevers, diaphoresis, malaise   HPI: Devantae DEVONTAE WIERMAN is a 56 y.o. male with medical history significant for chronic atrial fibrillation on warfarin, depression, hypertension, and OSA, now presenting to the emergency department with fevers, chills, diaphoresis, and malaise. Patient reports that he had been in his usual state of health until the evening prior to his presentation when he developed subjective fevers and chills. He was able to get some sleep last night, but fevers persisted upon waking and went on to develop progressive malaise, rigors, and diaphoresis. Patient denies any dyspnea or cough, rhinorrhea or sore throat, abdominal pain, nausea, vomiting, or diarrhea. Patient denies dysuria, but notes a decrease in urine output over the past day. He denies any rash or wound. There has been no recent long distance travel. Patient denies chest pain or palpitations and denies headache, change in vision or hearing, or focal numbness or weakness.   ED Course: Upon arrival to the ED, patient is found to be febrile to 40.1 C, saturating adequately on room air, blood pressure of 82/38, and vitals otherwise stable. EKG features a sinus rhythm with first degree AV nodal block and left axis deviation. Chest x-ray is negative for acute cardiopulmonary disease. Chemistry panel features a serum creatinine 1.86 with unknown baseline. CBC is notable for a leukocytosis to 16,700 and a mild thrombocytopenia with platelets 129,000. Lactic acid was elevated to a value of 2.19, INR is within the therapeutic range, and urinalysis is unremarkable. Flu PCR is negative. Blood and urine cultures were obtained, 30 cc/kg bolus of normal saline was administered, and the patient was started on empiric vancomycin and Zosyn. Blood pressure remained soft, but stable in the ED  and the patient has been in no apparent respiratory distress. He reports some subjective improvement in his condition following the fluid bolus. Will be admitted to the stepdown unit for ongoing evaluation and management of suspected sepsis with unknown source.   Review of Systems:  All other systems reviewed and apart from HPI, are negative.  Past Medical History:  Diagnosis Date  . A-fib ([place])   . Anxiety   . Heart disease   . High cholesterol    controlled  . Morbid obesity ([place])   . OSA (obstructive sleep apnea)     History reviewed. No pertinent surgical history.   reports that he has never smoked. He has never used smokeless tobacco. He reports that he does not drink alcohol or use drugs.  Allergies  Allergen Reactions  . Advair Diskus [Fluticasone-Salmeterol] Palpitations    Family History  Problem Relation Age of Onset  . Alzheimer's disease Mother      Prior to Admission medications   Medication Sig Start Date End Date Taking? Authorizing Provider  febuxostat (ULORIC) 40 MG tablet Take 40 mg by mouth daily.   Yes Historical Provider, MD  flecainide (TAMBOCOR) 100 MG tablet Take 100 mg by mouth 2 (two) times daily. 03/01/16  Yes Historical Provider, MD  ibuprofen (ADVIL,MOTRIN) 200 MG tablet Take 200 mg by mouth every 6 (six) hours as needed for fever.   Yes Historical Provider, MD  PARoxetine (PAXIL) 10 MG tablet Take 5 mg by mouth daily.   Yes Historical Provider, MD  rosuvastatin (CRESTOR) 10 MG tablet Take 10 mg by mouth daily.   Yes Historical Provider, MD  valsartan (DIOVAN) 320 MG  tablet Take 320 mg by mouth daily.    Yes Historical Provider, MD  verapamil (COVERA HS) 180 MG (CO) 24 hr tablet Take 180 mg by mouth daily.   Yes Historical Provider, MD  warfarin (COUMADIN) 5 MG tablet Take 5 mg by mouth daily.   Yes Historical Provider, MD    Physical Exam: Vitals:   05/07/16 2053 05/07/16 2200 05/07/16 2245 05/07/16 2315  BP:  100/65 (!) 82/38 (!) 105/49    Pulse:  92 84 81  Resp:  24  17  Temp:      TempSrc:      SpO2:  96% 96% 97%  Weight: 136.1 kg (300 lb)     Height: 5\' 9"  (1.753 m)         Constitutional: No respiratory distress, calm, comfortable. Appears uncomfortable, diaphoretic.  Eyes: PERTLA, lids and conjunctivae normal ENMT: Mucous membranes are moist. Posterior pharynx clear of any exudate or lesions.   Neck: normal, supple, no masses, no thyromegaly Respiratory: clear to auscultation bilaterally, no wheezing, no crackles. Normal respiratory effort.   Cardiovascular: S1 & S2 heard, regular rate and rhythm. 1+ pretibial edema bilaterally. JVP not well-visualized. Abdomen: No distension, no tenderness, no masses palpated. Bowel sounds normal.  Musculoskeletal: no clubbing / cyanosis. No joint deformity upper and lower extremities. Normal muscle tone.  Skin: no significant rashes, lesions, ulcers. Warm, well-perfused. Diaphoretic.  Neurologic: CN 2-12 grossly intact. Sensation intact, DTR normal. Strength 5/5 in all 4 limbs.  Psychiatric: Normal judgment and insight. Alert and oriented x 3. Normal mood and affect.     Labs on Admission: I have personally reviewed following labs and imaging studies  CBC:  Recent Labs Lab 05/07/16 2105  WBC 16.7*  NEUTROABS 14.3*  HGB 16.2  HCT 48.8  MCV 98.4  PLT [id number]*   Basic Metabolic Panel:  Recent Labs Lab 05/07/16 2105  NA 136  K 4.2  CL 106  CO2 19*  GLUCOSE 132*  BUN 20  CREATININE 1.86*  CALCIUM 8.7*   GFR: Estimated Creatinine Clearance: 60.1 mL/min (by C-G formula based on SCr of 1.86 mg/dL (H)). Liver Function Tests:  Recent Labs Lab 05/07/16 2105  AST 23  ALT 21  ALKPHOS 69  BILITOT 1.8*  PROT 7.4  ALBUMIN 3.5   No results for input(s): LIPASE, AMYLASE in the last 168 hours. No results for input(s): AMMONIA in the last 168 hours. Coagulation Profile:  Recent Labs Lab 05/07/16 2105  INR 2.34   Cardiac Enzymes: No results for input(s):  CKTOTAL, CKMB, CKMBINDEX, TROPONINI in the last 168 hours. BNP (last 3 results) No results for input(s): PROBNP in the last 8760 hours. HbA1C: No results for input(s): HGBA1C in the last 72 hours. CBG: No results for input(s): GLUCAP in the last 168 hours. Lipid Profile: No results for input(s): CHOL, HDL, LDLCALC, TRIG, CHOLHDL, LDLDIRECT in the last 72 hours. Thyroid Function Tests: No results for input(s): TSH, T4TOTAL, FREET4, T3FREE, THYROIDAB in the last 72 hours. Anemia Panel: No results for input(s): VITAMINB12, FOLATE, FERRITIN, TIBC, IRON, RETICCTPCT in the last 72 hours. Urine analysis: No results found for: COLORURINE, APPEARANCEUR, LABSPEC, PHURINE, GLUCOSEU, HGBUR, BILIRUBINUR, KETONESUR, PROTEINUR, UROBILINOGEN, NITRITE, LEUKOCYTESUR Sepsis Labs: @LABRCNTIP (procalcitonin:4,lacticidven:4) )No results found for this or any previous visit (from the past 240 hour(s)).   Radiological Exams on Admission: Dg Chest Portable 1 View  Result Date: 05/07/2016 CLINICAL DATA:  Shortness of breath and fever, sepsis. EXAM: PORTABLE CHEST 1 VIEW COMPARISON:  07/01/2015 FINDINGS: Lungs are  adequately inflated without focal consolidation or effusion. Cardiomediastinal silhouette is within normal. There is mild degenerate change of the spine. IMPRESSION: No active disease. Electronically Signed   By: Marin Olp M.D.   On: 05/07/2016 22:13    EKG: Independently reviewed. Sinus rhythm, first degree AV block, LAD   Assessment/Plan  1. Sepsis, unknown source  - Pt presents with fever, leukocytosis, elevated lactate, hypotension, and likely AKI  - There have not been any localizing sxs and source not yet identified  - CXR clear, UA no bacteria, abd soft and non-tender, no meningismus, no rash or wound noted, flu PCR negative  - Blood and urine cultures obtained in ED and incubating  - 30 cc/kg NS bolus given in ED  - Continue empiric vancomycin and Zosyn while following cultures and  clinical response to treatment  2. Hypotension, hx of hypertension  - BP 82/38 in setting of apparent sepsis  - BP improved with fluid-resuscitation and has remained soft, but stable  - Pt has hx of HTN on valsartan; this is held on admission  - Continue sepsis treatment as above and monitor in stepdown unit   3. Kidney disease of uncertain chronicity   - SCr is 1.86 on admission with no baseline available  - Pt wife reports that "BUN was a little elevated before," but pt denies CKD hx  - Suspect this is acute in setting of hypotension; ATN possible - Continue fluid-resuscitation, hold valsartan, monitor UOP, repeat chemistries in am   4. Chronic atrial fibrillation  - Pt is in atrial fibrillation on admission  - CHADS-VASc at least 1 (HTN)  - Continue warfarin, flecainide, verapamil   5. Depression - Stable on admission  - Continue Paxil   6. OSA - Continue CPAP qHS    DVT prophylaxis: warfarin Code Status: Full  Family Communication: Wife updated at bedside Disposition Plan: Admit to stepdown Consults called: None Admission status: Inpatient    Vianne Bulls, MD [facility] Pager [phone]  If 7PM-7AM, please contact night-coverage [email or web address] Password [email or web address]  05/08/2016, 12:02 AM

## 2016-05-08 NOTE — Care Management Note (Signed)
Case Management Note  Patient Details  Name: Ricky Burton MRN: [id number] Date of Birth: Jun 06, 1960  Subjective/Objective:   Presents with Sepsis, fever, hypotension and tachypnea, leukocytosis, with elevated lactic acid and aki, conts on iv abxs.  NCM will cont to follow for dc needs.                 Action/Plan:   Expected Discharge Date:                  Expected Discharge Plan:  Home/Self Care  In-House Referral:     Discharge planning Services  CM Consult  Post Acute Care Choice:    Choice offered to:     DME Arranged:    DME Agency:     HH Arranged:    HH Agency:     Status of Service:  In process, will continue to follow  If discussed at Long Length of Stay Meetings, dates discussed:    Additional Comments:  [name], RN 05/08/2016, 3:02 PM

## 2016-05-08 NOTE — Progress Notes (Signed)
  PHARMACY - PHYSICIAN COMMUNICATION CRITICAL VALUE ALERT - BLOOD CULTURE IDENTIFICATION (BCID)  Results for orders placed or performed during the hospital encounter of 05/07/16  Blood Culture ID Panel (Reflexed) (Collected: 05/07/2016  9:12 PM)  Result Value Ref Range   Enterococcus species NOT DETECTED NOT DETECTED   Listeria monocytogenes NOT DETECTED NOT DETECTED   Staphylococcus species NOT DETECTED NOT DETECTED   Staphylococcus aureus NOT DETECTED NOT DETECTED   Streptococcus species NOT DETECTED NOT DETECTED   Streptococcus agalactiae NOT DETECTED NOT DETECTED   Streptococcus pneumoniae NOT DETECTED NOT DETECTED   Streptococcus pyogenes NOT DETECTED NOT DETECTED   Acinetobacter baumannii NOT DETECTED NOT DETECTED   Enterobacteriaceae species DETECTED (A) NOT DETECTED   Enterobacter cloacae complex NOT DETECTED NOT DETECTED   Escherichia coli DETECTED (A) NOT DETECTED   Klebsiella oxytoca NOT DETECTED NOT DETECTED   Klebsiella pneumoniae NOT DETECTED NOT DETECTED   Proteus species NOT DETECTED NOT DETECTED   Serratia marcescens NOT DETECTED NOT DETECTED   Carbapenem resistance NOT DETECTED NOT DETECTED   Haemophilus influenzae NOT DETECTED NOT DETECTED   Neisseria meningitidis NOT DETECTED NOT DETECTED   Pseudomonas aeruginosa NOT DETECTED NOT DETECTED   Candida albicans NOT DETECTED NOT DETECTED   Candida glabrata NOT DETECTED NOT DETECTED   Candida krusei NOT DETECTED NOT DETECTED   Candida parapsilosis NOT DETECTED NOT DETECTED   Candida tropicalis NOT DETECTED NOT DETECTED    Name of physician (or Provider) Contacted: N Dhungel  3/4 Blood cx's positive with GNRs. BCID detected E. Coli. Currently on vancomycin and Zosyn for sepsis. Tmax of 104.2 yesterday, WBC 19.5.  Changes to prescribed antibiotics required: After discussion with MD can stop vancomycin and continue the Zosyn for now. Consider de-escalating to ceftriaxone soon.   Elenor Quinones, PharmD,  BCPS Clinical Pharmacist Pager [phone] 05/08/2016 1:58 PM

## 2016-05-08 NOTE — Progress Notes (Signed)
ANTICOAGULATION CONSULT NOTE - Initial Consult  Pharmacy Consult for Coumadin Indication: atrial fibrillation  Allergies  Allergen Reactions  . Advair Diskus [Fluticasone-Salmeterol] Palpitations    Patient Measurements: Height: 5\' 9"  (175.3 cm) Weight: (!) 318 lb 9 oz (144.5 kg) IBW/kg (Calculated) : 70.7  Vital Signs: Temp: 100.4 F (38 C) (01/29 0430) Temp Source: Oral (01/29 0430) BP: 102/68 (01/29 0221) Pulse Rate: 68 (01/29 0221)  Labs:  Recent Labs  05/07/16 2105 05/08/16 0314  HGB 16.2 13.8  HCT 48.8 42.2  PLT 129* 117*  LABPROT 26.1* 28.2*  INR 2.34 2.59  CREATININE 1.86* 1.84*    Estimated Creatinine Clearance: 62.8 mL/min (by C-G formula based on SCr of 1.84 mg/dL (H)).   Medical History: Past Medical History:  Diagnosis Date  . A-fib ([place])   . Anxiety   . Heart disease   . High cholesterol    controlled  . Morbid obesity ([place])   . OSA (obstructive sleep apnea)     Medications:  See electronic med rec  Assessment: 56 y.o. F admitted with sepsis. Coumadin 5mg  daily PTA for afib. Last taken on 1/27. Admission INR was therapeutic at 2.34. INR today remains therapeutic at 2.59. Hgb stable and plts low at 117. No s/s of bleed.  Goal of Therapy:  INR 2-3 Monitor platelets by anticoagulation protocol: Yes   Plan:  Continue Coumadin 5mg  daily  Monitor daily INR, CBC, s/s of bleed  Elenor Quinones, PharmD, BCPS Clinical Pharmacist Pager [phone] 05/08/2016 8:40 AM

## 2016-05-08 NOTE — Progress Notes (Signed)
ANTICOAGULATION CONSULT NOTE - Initial Consult  Pharmacy Consult for Coumadin Indication: atrial fibrillation  Allergies  Allergen Reactions  . Advair Diskus [Fluticasone-Salmeterol] Palpitations    Patient Measurements: Height: 5\' 9"  (175.3 cm) Weight: 300 lb (136.1 kg) IBW/kg (Calculated) : 70.7  Vital Signs: Temp: 104.2 F (40.1 C) (01/28 2049) Temp Source: Oral (01/28 2049) BP: 105/49 (01/28 2315) Pulse Rate: 81 (01/28 2315)  Labs:  Recent Labs  05/07/16 2105  HGB 16.2  HCT 48.8  PLT 129*  LABPROT 26.1*  INR 2.34  CREATININE 1.86*    Estimated Creatinine Clearance: 60.1 mL/min (by C-G formula based on SCr of 1.86 mg/dL (H)).   Medical History: Past Medical History:  Diagnosis Date  . A-fib ([place])   . Anxiety   . Heart disease   . High cholesterol    controlled  . Morbid obesity ([place])   . OSA (obstructive sleep apnea)     Medications:  See electronic med rec  Assessment: 56 y.o. F admitted with sepsis. Pt on coumadin PTA for afib. Admission INR 2.34 -therapeutic. Home dose: 5mg  daily - last taken 1/27  Goal of Therapy:  INR 2-3 Monitor platelets by anticoagulation protocol: Yes   Plan:  Daily INR Coumadin 5mg  daily - first dose now  Sherlon Handing, PharmD, BCPS Clinical pharmacist, pager [phone] 05/08/2016,12:34 AM

## 2016-05-08 NOTE — Progress Notes (Addendum)
RT inspected pt. Home cpap. No frayed wires or cords. Machine appears to be intact. RT filled pt. Water chamber with sterile water as well. Pt. Able to operate himself.

## 2016-05-09 DIAGNOSIS — R7881 Bacteremia: Secondary | ICD-10-CM

## 2016-05-09 DIAGNOSIS — N179 Acute kidney failure, unspecified: Secondary | ICD-10-CM

## 2016-05-09 LAB — CBC
HCT: 50 % (ref 39.0–52.0)
HEMOGLOBIN: 16.4 g/dL (ref 13.0–17.0)
MCH: 32.7 pg (ref 26.0–34.0)
MCHC: 32.8 g/dL (ref 30.0–36.0)
MCV: 99.6 fL (ref 78.0–100.0)
PLATELETS: 122 10*3/uL — AB (ref 150–400)
RBC: 5.02 MIL/uL (ref 4.22–5.81)
RDW: 12.6 % (ref 11.5–15.5)
WBC: 11 10*3/uL — AB (ref 4.0–10.5)

## 2016-05-09 LAB — PROTIME-INR
INR: 2.65
PROTHROMBIN TIME: 28.8 s — AB (ref 11.4–15.2)

## 2016-05-09 LAB — BASIC METABOLIC PANEL
ANION GAP: 9 (ref 5–15)
BUN: 16 mg/dL (ref 6–20)
CALCIUM: 8.7 mg/dL — AB (ref 8.9–10.3)
CO2: 24 mmol/L (ref 22–32)
CREATININE: 1.78 mg/dL — AB (ref 0.61–1.24)
Chloride: 107 mmol/L (ref 101–111)
GFR, EST AFRICAN AMERICAN: 47 mL/min — AB (ref >60)
GFR, EST NON AFRICAN AMERICAN: 41 mL/min — AB (ref >60)
Glucose, Bld: 118 mg/dL — ABNORMAL HIGH (ref 65–99)
Potassium: 4.1 mmol/L (ref 3.5–5.1)
SODIUM: 140 mmol/L (ref 135–145)

## 2016-05-09 LAB — URINE CULTURE: Culture: <10000 — AB

## 2016-05-09 LAB — GLUCOSE, CAPILLARY: GLUCOSE-CAPILLARY: 85 mg/dL (ref 65–99)

## 2016-05-09 MED ORDER — SODIUM CHLORIDE 0.9 % IV BOLUS (SEPSIS): 500.0000 mL | Freq: Once | INTRAVENOUS | Status: DC

## 2016-05-09 MED ORDER — IBUPROFEN 400 MG PO TABS
400.0000 mg | ORAL_TABLET | Freq: Once | ORAL | Status: DC
  Filled 2016-05-09: qty 2

## 2016-05-09 MED ORDER — WARFARIN SODIUM 5 MG PO TABS
5.0000 mg | ORAL_TABLET | Freq: Once | ORAL | Status: AC
  Administered 2016-05-09: 5 mg via ORAL
  Filled 2016-05-09: qty 1

## 2016-05-09 NOTE — Progress Notes (Signed)
Paged Raliegh Ip Schorr of [facility] regarding pt's assymptomatic HR pauses and dipping into 30s. Pt resting comfortably and VS otherwise stable. No orders received: Will continue to monitor.

## 2016-05-09 NOTE — Progress Notes (Signed)
PROGRESS NOTE                                                                                                                                                                                                             Patient Demographics:    Ricky Burton, is a 56 y.o. male, DOB - December 15, 1960, [id number]  Admit date - 05/07/2016   Admitting Physician Vianne Bulls, MD  Outpatient Primary MD for the patient is Jilda Panda, MD  LOS - 2  Outpatient Specialists:none  Chief Complaint  Patient presents with  . Fever  . Shortness of Breath       Brief Narrative   56 year old morbidly obese male with history of chronic A. fib on Coumadin, depression, hypertension, OSA presented to the ED with fever of almost 104F, chills, diaphoresis and malaise for 1 day duration. In the ED was septic with fever, hypotension and tachypnea. Also had leukocytosis with elevated lactic acid and acute kidney injury. Initial workup including chest x-ray, UA and flu PCR were all negative. Admitted to stepdown unit for sepsis and placed on empiric antibiotics.    Subjective:   Frequent temperature spikes yesterday. Improved overnight. Afebrile this morning. Feels better overall.   Assessment  & Plan :    Principal Problem:   Sepsis ([place]) No clear source. Blood culture on admission growing Enterobacter Escherichia coli. Narrow antibiotics to Zosyn. Follow sensitivity. Flu PCR and respiratory viral panel negative. Continue IV fluids.  Sepsis is resolving.  Active Problems:   Atrial fibrillation ([facility]) Rate controlled. Continue verapamil , flecainide and Coumadin.    Hypotension Secondary to sepsis..  IV fluids.  Acute kidney injury Possibly due to sepsis. Patient also on valsartan and taking Advil when necessary for pain. Hold ARB and avoid nephrotoxins. Continue hydration.  Chronic depression Continue Paxil.    OSA  (obstructive sleep apnea)   Morbid obesity Counseled on diet and exercise to lose weight.   Code Status : Full code   Family Communication  : Wife at bedside  Disposition Plan  :Home once improved and pending final culture and sensitivity . Possibly in the next 48 hours .Transfer to telemetry.  Barriers For Discharge : Active symptoms  Consults  :  None  Procedures  : None  DVT Prophylaxis  : Heparin  Lab Results  Component Value Date  PLT 122 (L) 05/09/2016    Antibiotics  :    Anti-infectives    Start     Dose/Rate Route Frequency Ordered Stop   05/08/16 2200  vancomycin (VANCOCIN) 1,500 mg in sodium chloride 0.9 % 500 mL IVPB  Status:  Discontinued     1,500 mg 250 mL/hr over 120 Minutes Intravenous Every 24 hours 05/08/16 0013 05/08/16 1404   05/08/16 0600  piperacillin-tazobactam (ZOSYN) IVPB 3.375 g     3.375 g 12.5 mL/hr over 240 Minutes Intravenous Every 8 hours 05/08/16 0013     05/08/16 0015  oseltamivir (TAMIFLU) capsule 30 mg  Status:  Discontinued     30 mg Oral  Once 05/08/16 0002 05/08/16 1400   05/07/16 2300  vancomycin (VANCOCIN) 2,000 mg in sodium chloride 0.9 % 500 mL IVPB     2,000 mg 250 mL/hr over 120 Minutes Intravenous  Once 05/07/16 2239 05/08/16 0115   05/07/16 2245  cefTRIAXone (ROCEPHIN) 1 g in dextrose 5 % 50 mL IVPB  Status:  Discontinued     1 g 100 mL/hr over 30 Minutes Intravenous  Once 05/07/16 2231 05/07/16 2238   05/07/16 2245  piperacillin-tazobactam (ZOSYN) IVPB 3.375 g     3.375 g 100 mL/hr over 30 Minutes Intravenous  Once 05/07/16 2238 05/07/16 2318   05/07/16 2245  vancomycin (VANCOCIN) IVPB 1000 mg/200 mL premix  Status:  Discontinued     1,000 mg 200 mL/hr over 60 Minutes Intravenous  Once 05/07/16 2238 05/07/16 2239        Objective:   Vitals:   05/09/16 0500 05/09/16 0755 05/09/16 0936 05/09/16 0947  BP:  111/67  123/67  Pulse:  67    Resp:  (!) 23    Temp:  98.9 F (37.2 C) 100.2 F (37.9 C)   TempSrc:  Oral  Oral   SpO2:  100%    Weight: (!) 148.6 kg (327 lb 9.7 oz)     Height:        Wt Readings from Last 3 Encounters:  05/09/16 (!) 148.6 kg (327 lb 9.7 oz)  11/07/12 (!) 145.2 kg (320 lb)  11/06/12 (!) 158.8 kg (350 lb)     Intake/Output Summary (Last 24 hours) at 05/09/16 1054 Last data filed at 05/09/16 1000  Gross per 24 hour  Intake          3085.42 ml  Output                0 ml  Net          3085.42 ml     Physical Exam  Gen:Not in distress HEENT:  moist mucosa, supple neck Chest: clear b/l, no added sounds CVS:  S1 and S2 normal, no murmurs rubs or gallop  GI: soft, NT, ND,  Musculoskeletal: warm, no edema    Data Review:    CBC  Recent Labs Lab 05/07/16 2105 05/08/16 0314 05/09/16 0214  WBC 16.7* 19.5* 11.0*  HGB 16.2 13.8 16.4  HCT 48.8 42.2 50.0  PLT 129* 117* 122*  MCV 98.4 99.3 99.6  MCH 32.7 32.5 32.7  MCHC 33.2 32.7 32.8  RDW 12.2 12.4 12.6  LYMPHSABS 1.6 1.4  --   MONOABS 0.7 1.1*  --   EOSABS 0.0 0.0  --   BASOSABS 0.0 0.0  --     Chemistries   Recent Labs Lab 05/07/16 2105 05/08/16 0314 05/09/16 0214  NA 136 139 140  K 4.2 5.0 4.1  CL 106 110  107  CO2 19* 23 24  GLUCOSE 132* 124* 118*  BUN 20 17 16   CREATININE 1.86* 1.84* 1.78*  CALCIUM 8.7* 7.9* 8.7*  AST 23  --   --   ALT 21  --   --   ALKPHOS 69  --   --   BILITOT 1.8*  --   --    ------------------------------------------------------------------------------------------------------------------ No results for input(s): CHOL, HDL, LDLCALC, TRIG, CHOLHDL, LDLDIRECT in the last 72 hours.  No results found for: HGBA1C ------------------------------------------------------------------------------------------------------------------ No results for input(s): TSH, T4TOTAL, T3FREE, THYROIDAB in the last 72 hours.  Invalid input(s): FREET3 ------------------------------------------------------------------------------------------------------------------ No results for input(s):  VITAMINB12, FOLATE, FERRITIN, TIBC, IRON, RETICCTPCT in the last 72 hours.  Coagulation profile  Recent Labs Lab 05/07/16 2105 05/08/16 0314 05/09/16 0214  INR 2.34 2.59 2.65    No results for input(s): DDIMER in the last 72 hours.  Cardiac Enzymes No results for input(s): CKMB, TROPONINI, MYOGLOBIN in the last 168 hours.  Invalid input(s): CK ------------------------------------------------------------------------------------------------------------------ No results found for: BNP  Inpatient Medications  Scheduled Meds: . flecainide  100 mg Oral BID  . ibuprofen  400 mg Oral Once  . PARoxetine  5 mg Oral Daily  . piperacillin-tazobactam (ZOSYN)  IV  3.375 g Intravenous Q8H  . rosuvastatin  10 mg Oral q1800  . sodium chloride  500 mL Intravenous Once  . sodium chloride flush  3 mL Intravenous Q12H  . verapamil  180 mg Oral Daily  . warfarin  5 mg Oral ONCE-1800  . Warfarin - Pharmacist Dosing Inpatient   Does not apply q1800   Continuous Infusions: . sodium chloride 125 mL/hr at 05/08/16 1900   PRN Meds:.acetaminophen **OR** acetaminophen, bisacodyl, HYDROcodone-acetaminophen, ondansetron **OR** ondansetron (ZOFRAN) IV, polyethylene glycol  Micro Results Recent Results (from the past 240 hour(s))  Culture, blood (Routine x 2)     Status: Abnormal (Preliminary result)   Collection Time: 05/07/16  8:58 PM  Result Value Ref Range Status   Specimen Description BLOOD RIGHT HAND  Final   Special Requests BOTTLES DRAWN AEROBIC AND ANAEROBIC 5CC  Final   Culture  Setup Time   Final    GRAM NEGATIVE RODS AEROBIC BOTTLE ONLY CRITICAL VALUE NOTED.  VALUE IS CONSISTENT WITH PREVIOUSLY REPORTED AND CALLED VALUE.    Culture ESCHERICHIA COLI (A)  Final   Report Status PENDING  Incomplete  Culture, blood (Routine x 2)     Status: Abnormal (Preliminary result)   Collection Time: 05/07/16  9:12 PM  Result Value Ref Range Status   Specimen Description BLOOD LEFT HAND  Final    Special Requests BOTTLES DRAWN AEROBIC AND ANAEROBIC 5CC  Final   Culture  Setup Time   Final    GRAM NEGATIVE RODS Organism ID to follow CRITICAL RESULT CALLED TO, READ BACK BY AND VERIFIED WITH: N.  Batchelder Pharm.D. 13:00 05/08/16  ([email or web address])    Culture ESCHERICHIA COLI SUSCEPTIBILITIES TO FOLLOW  (A)  Final   Report Status PENDING  Incomplete  Blood Culture ID Panel (Reflexed)     Status: Abnormal   Collection Time: 05/07/16  9:12 PM  Result Value Ref Range Status   Enterococcus species NOT DETECTED NOT DETECTED Final   Listeria monocytogenes NOT DETECTED NOT DETECTED Final   Staphylococcus species NOT DETECTED NOT DETECTED Final   Staphylococcus aureus NOT DETECTED NOT DETECTED Final   Streptococcus species NOT DETECTED NOT DETECTED Final   Streptococcus agalactiae NOT DETECTED NOT DETECTED Final   Streptococcus pneumoniae NOT DETECTED NOT  DETECTED Final   Streptococcus pyogenes NOT DETECTED NOT DETECTED Final   Acinetobacter baumannii NOT DETECTED NOT DETECTED Final   Enterobacteriaceae species DETECTED (A) NOT DETECTED Final    Comment: CRITICAL RESULT CALLED TO, READ BACK BY AND VERIFIED WITH: N. Batchelder Pharm.D. 13:00 05/08/16 ([email or web address])    Enterobacter cloacae complex NOT DETECTED NOT DETECTED Final   Escherichia coli DETECTED (A) NOT DETECTED Final    Comment: CRITICAL RESULT CALLED TO, READ BACK BY AND VERIFIED WITH: N. Batchelder Pharm.D. 13:00 05/08/16 ([email or web address])    Klebsiella oxytoca NOT DETECTED NOT DETECTED Final   Klebsiella pneumoniae NOT DETECTED NOT DETECTED Final   Proteus species NOT DETECTED NOT DETECTED Final   Serratia marcescens NOT DETECTED NOT DETECTED Final   Carbapenem resistance NOT DETECTED NOT DETECTED Final   Haemophilus influenzae NOT DETECTED NOT DETECTED Final   Neisseria meningitidis NOT DETECTED NOT DETECTED Final   Pseudomonas aeruginosa NOT DETECTED NOT DETECTED Final   Candida albicans NOT DETECTED NOT DETECTED Final   Candida glabrata  NOT DETECTED NOT DETECTED Final   Candida krusei NOT DETECTED NOT DETECTED Final   Candida parapsilosis NOT DETECTED NOT DETECTED Final   Candida tropicalis NOT DETECTED NOT DETECTED Final  Urine culture     Status: Abnormal   Collection Time: 05/08/16 12:16 AM  Result Value Ref Range Status   Specimen Description URINE, CLEAN CATCH  Final   Special Requests NONE  Final   Culture <10,000 COLONIES/mL INSIGNIFICANT GROWTH (A)  Final   Report Status 05/09/2016 FINAL  Final  Respiratory Panel by PCR     Status: None   Collection Time: 05/08/16  1:42 AM  Result Value Ref Range Status   Adenovirus NOT DETECTED NOT DETECTED Final   Coronavirus 229E NOT DETECTED NOT DETECTED Final   Coronavirus HKU1 NOT DETECTED NOT DETECTED Final   Coronavirus NL63 NOT DETECTED NOT DETECTED Final   Coronavirus OC43 NOT DETECTED NOT DETECTED Final   Metapneumovirus NOT DETECTED NOT DETECTED Final   Rhinovirus / Enterovirus NOT DETECTED NOT DETECTED Final   Influenza A NOT DETECTED NOT DETECTED Final   Influenza B NOT DETECTED NOT DETECTED Final   Parainfluenza Virus 1 NOT DETECTED NOT DETECTED Final   Parainfluenza Virus 2 NOT DETECTED NOT DETECTED Final   Parainfluenza Virus 3 NOT DETECTED NOT DETECTED Final   Parainfluenza Virus 4 NOT DETECTED NOT DETECTED Final   Respiratory Syncytial Virus NOT DETECTED NOT DETECTED Final   Bordetella pertussis NOT DETECTED NOT DETECTED Final   Chlamydophila pneumoniae NOT DETECTED NOT DETECTED Final   Mycoplasma pneumoniae NOT DETECTED NOT DETECTED Final  MRSA PCR Screening     Status: None   Collection Time: 05/08/16  1:42 AM  Result Value Ref Range Status   MRSA by PCR NEGATIVE NEGATIVE Final    Comment:        The GeneXpert MRSA Assay (FDA approved for NASAL specimens only), is one component of a comprehensive MRSA colonization surveillance program. It is not intended to diagnose MRSA infection nor to guide or monitor treatment for MRSA infections.      Radiology Reports Dg Chest Portable 1 View  Result Date: 05/07/2016 CLINICAL DATA:  Shortness of breath and fever, sepsis. EXAM: PORTABLE CHEST 1 VIEW COMPARISON:  07/01/2015 FINDINGS: Lungs are adequately inflated without focal consolidation or effusion. Cardiomediastinal silhouette is within normal. There is mild degenerate change of the spine. IMPRESSION: No active disease. Electronically Signed   By: Marin Olp M.D.   On: 05/07/2016 22:13  Time Spent in minutes 35   Louellen Molder M.D on 05/09/2016 at 10:54 AM  Between 7am to 7pm - Pager - [phone]  After 7pm go to [email or web address] - password [facility]  [facility] -  Office  [phone]

## 2016-05-09 NOTE — Progress Notes (Signed)
ANTICOAGULATION CONSULT NOTE - Follow-Up Consult  Pharmacy Consult for Coumadin Indication: atrial fibrillation  Patient Measurements: Height: 5\' 9"  (175.3 cm) Weight: (!) 327 lb 9.7 oz (148.6 kg) IBW/kg (Calculated) : 70.7  Vital Signs: Temp: 100.2 F (37.9 C) (01/30 0936) Temp Source: Oral (01/30 0936) BP: 111/67 (01/30 0755) Pulse Rate: 67 (01/30 0755)  Labs:  Recent Labs  05/07/16 2105 05/08/16 0314 05/09/16 0214  HGB 16.2 13.8 16.4  HCT 48.8 42.2 50.0  PLT 129* 117* 122*  LABPROT 26.1* 28.2* 28.8*  INR 2.34 2.59 2.65  CREATININE 1.86* 1.84* 1.78*    Estimated Creatinine Clearance: 66 mL/min (by C-G formula based on SCr of 1.78 mg/dL (H)).   Assessment: 58 YOM resumed on warfarin from PTA for hx Afib. Admit INR therapeutic at 2.34 on PTA dose of 5 mg/day.   INR today remains therapeutic (INR 2.65 << 2.59, goal of 2-3). Hgb/Hct wnl, plts noted to be low at 122 but likely related to sepsis. No overt s/sx of bleeding noted.   Goal of Therapy:  INR 2-3 Monitor platelets by anticoagulation protocol: Yes   Plan:  1. Warfarin 5 mg x 1 dose at 1800 today 2. Will continue to monitor for any signs/symptoms of bleeding and will follow up with PT/INR in the a.m.   Thank you for allowing pharmacy to be a part of this patient's care.  Alycia Rossetti, PharmD, BCPS Clinical Pharmacist Pager: [phone] Clinical phone for 05/09/2016 from 7a-3:30p: [phone] If after 3:30p, please call main pharmacy at: [phone] 05/09/2016 9:46 AM

## 2016-05-10 ENCOUNTER — Inpatient Hospital Stay (HOSPITAL_COMMUNITY): Payer: Medicare Other

## 2016-05-10 ENCOUNTER — Institutional Professional Consult (permissible substitution): Payer: Medicare Other | Admitting: Neurology

## 2016-05-10 DIAGNOSIS — I482 Chronic atrial fibrillation: Secondary | ICD-10-CM

## 2016-05-10 DIAGNOSIS — A4151 Sepsis due to Escherichia coli [E. coli]: Principal | ICD-10-CM

## 2016-05-10 DIAGNOSIS — I9589 Other hypotension: Secondary | ICD-10-CM

## 2016-05-10 DIAGNOSIS — F329 Major depressive disorder, single episode, unspecified: Secondary | ICD-10-CM

## 2016-05-10 DIAGNOSIS — N289 Disorder of kidney and ureter, unspecified: Secondary | ICD-10-CM

## 2016-05-10 DIAGNOSIS — I481 Persistent atrial fibrillation: Secondary | ICD-10-CM

## 2016-05-10 LAB — CULTURE, BLOOD (ROUTINE X 2)

## 2016-05-10 LAB — COMPREHENSIVE METABOLIC PANEL
ALBUMIN: 2.5 g/dL — AB (ref 3.5–5.0)
ALK PHOS: 74 U/L (ref 38–126)
ALT: 45 U/L (ref 17–63)
ANION GAP: 8 (ref 5–15)
AST: 47 U/L — AB (ref 15–41)
BILIRUBIN TOTAL: 1.5 mg/dL — AB (ref 0.3–1.2)
BUN: 16 mg/dL (ref 6–20)
CO2: 21 mmol/L — AB (ref 22–32)
Calcium: 8.2 mg/dL — ABNORMAL LOW (ref 8.9–10.3)
Chloride: 108 mmol/L (ref 101–111)
Creatinine, Ser: 1.7 mg/dL — ABNORMAL HIGH (ref 0.61–1.24)
GFR calc Af Amer: 50 mL/min — ABNORMAL LOW (ref >60)
GFR calc non Af Amer: 43 mL/min — ABNORMAL LOW (ref >60)
GLUCOSE: 97 mg/dL (ref 65–99)
Potassium: 3.9 mmol/L (ref 3.5–5.1)
SODIUM: 137 mmol/L (ref 135–145)
TOTAL PROTEIN: 6.4 g/dL — AB (ref 6.5–8.1)

## 2016-05-10 LAB — CBC
HCT: 42.9 % (ref 39.0–52.0)
HEMOGLOBIN: 14.3 g/dL (ref 13.0–17.0)
MCH: 32.1 pg (ref 26.0–34.0)
MCHC: 33.3 g/dL (ref 30.0–36.0)
MCV: 96.4 fL (ref 78.0–100.0)
Platelets: 122 10*3/uL — ABNORMAL LOW (ref 150–400)
RBC: 4.45 MIL/uL (ref 4.22–5.81)
RDW: 12.7 % (ref 11.5–15.5)
WBC: 6.7 10*3/uL (ref 4.0–10.5)

## 2016-05-10 LAB — BASIC METABOLIC PANEL
ANION GAP: 7 (ref 5–15)
BUN: 15 mg/dL (ref 6–20)
CALCIUM: 8.2 mg/dL — AB (ref 8.9–10.3)
CO2: 22 mmol/L (ref 22–32)
CREATININE: 1.77 mg/dL — AB (ref 0.61–1.24)
Chloride: 109 mmol/L (ref 101–111)
GFR, EST AFRICAN AMERICAN: 47 mL/min — AB (ref >60)
GFR, EST NON AFRICAN AMERICAN: 41 mL/min — AB (ref >60)
Glucose, Bld: 100 mg/dL — ABNORMAL HIGH (ref 65–99)
Potassium: 4 mmol/L (ref 3.5–5.1)
SODIUM: 138 mmol/L (ref 135–145)

## 2016-05-10 LAB — PROTIME-INR
INR: 2.29
PROTHROMBIN TIME: 25.6 s — AB (ref 11.4–15.2)

## 2016-05-10 LAB — MAGNESIUM: Magnesium: 1.8 mg/dL (ref 1.7–2.4)

## 2016-05-10 MED ORDER — WARFARIN SODIUM 5 MG PO TABS
5.0000 mg | ORAL_TABLET | Freq: Every day | ORAL | Status: AC
  Administered 2016-05-10: 5 mg via ORAL
  Filled 2016-05-10: qty 1

## 2016-05-10 MED ORDER — DEXTROSE 5 % IV SOLN
2.0000 g | INTRAVENOUS | Status: DC
  Administered 2016-05-10: 2 g via INTRAVENOUS
  Filled 2016-05-10 (×2): qty 2

## 2016-05-10 MED ORDER — FLECAINIDE ACETATE 100 MG PO TABS
100.0000 mg | ORAL_TABLET | Freq: Two times a day (BID) | ORAL | Status: DC
  Administered 2016-05-10 – 2016-05-11 (×2): 100 mg via ORAL
  Filled 2016-05-10 (×2): qty 1

## 2016-05-10 MED ORDER — VERAPAMIL HCL ER 180 MG PO TBCR
180.0000 mg | EXTENDED_RELEASE_TABLET | Freq: Every day | ORAL | Status: DC
  Administered 2016-05-11: 180 mg via ORAL
  Filled 2016-05-10: qty 1

## 2016-05-10 NOTE — Consult Note (Signed)
ELECTROPHYSIOLOGY CONSULT NOTE    Patient ID: Ricky Burton MRN: [id number], DOB/AGE: May 28, 1960 56 y.o.  Admit date: 05/07/2016 Date of Consult: 05/10/2016   Primary Physician: Jilda Panda, MD Primary Cardiologist: remotely seen by Dr. Caryl Comes (2012), Dr. Einar Gip  Reason for Consultation: pauses  HPI: Ricky Burton is a 56 y.o. male AFlutter ablated in [place] in 2003 with recurrent atrial arrhythmia and SVT historically on flecainide, obesity, HTN, OSA, subsequently said to be in permanent AFib admitted 05/08/16 with c/o fever, (104), chills/rigors, malaise, weakness, found septic, E Coli, unknown source.  He was observed on telemetry to have AFib with pauses, reported as 3 episodes of 2 second pauses, his verpamil was held and EP asked to evaluate.  The patient is feeling well, is unaware of his afib typically and is under the impression that he is for the most part iin AF, rarely found in SR, follows out patient routinely with Dr. Einar Gip.  He is without any symptoms of bradycardia today or otherwise, no CP or SOB, reports he is planned for discharge tomorrow.  His verapamil was given this morning, hled going forward so far.  The patient/wife mention that without his meds he does tend to feel his AF.  Last febrile temp was 102.8 on 05/09/16 @2135  LABS K+ 3.9 BUN/Creat 16/1.70 (trending down) AST 47 H/H 14/42 Plts 122 WBC 6.7 (from 16) INR 2.29 .  Past Medical History:  Diagnosis Date  . A-fib ([place])   . Anxiety   . Heart disease   . High cholesterol    controlled  . Morbid obesity ([place])   . OSA (obstructive sleep apnea)      Surgical History: History reviewed. No pertinent surgical history.   Prescriptions Prior to Admission  Medication Sig Dispense Refill Last Dose  . febuxostat (ULORIC) 40 MG tablet Take 40 mg by mouth daily.   05/07/2016 at Unknown time  . flecainide (TAMBOCOR) 100 MG tablet Take 100 mg by mouth 2 (two) times daily.   05/07/2016 at Unknown time  . ibuprofen  (ADVIL,MOTRIN) 200 MG tablet Take 200 mg by mouth every 6 (six) hours as needed for fever.   05/07/2016 at Unknown time  . PARoxetine (PAXIL) 10 MG tablet Take 5 mg by mouth daily.   05/07/2016 at Unknown time  . rosuvastatin (CRESTOR) 10 MG tablet Take 10 mg by mouth daily.   05/06/2016 at Unknown time  . valsartan (DIOVAN) 320 MG tablet Take 320 mg by mouth daily.    05/07/2016 at Unknown time  . verapamil (COVERA HS) 180 MG (CO) 24 hr tablet Take 180 mg by mouth daily.   05/07/2016 at Unknown time  . warfarin (COUMADIN) 5 MG tablet Take 5 mg by mouth daily.   05/06/2016 at 2200    Inpatient Medications:  . cefTRIAXone (ROCEPHIN)  IV  2 g Intravenous Q24H  . flecainide  100 mg Oral BID  . ibuprofen  400 mg Oral Once  . PARoxetine  5 mg Oral Daily  . rosuvastatin  10 mg Oral q1800  . sodium chloride  500 mL Intravenous Once  . sodium chloride flush  3 mL Intravenous Q12H  . warfarin  5 mg Oral q1800  . Warfarin - Pharmacist Dosing Inpatient   Does not apply q1800    Allergies:  Allergies  Allergen Reactions  . Advair Diskus [Fluticasone-Salmeterol] Palpitations    Social History   Social History  . Marital status: Married    Spouse name: Marliss Coots  . Number  of children: 2  . Years of education: N/A   Occupational History  . Not on file.   Social History Main Topics  . Smoking status: Never Smoker  . Smokeless tobacco: Never Used  . Alcohol use No  . Drug use: No  . Sexual activity: Not on file   Other Topics Concern  . Not on file   Social History Narrative   Patient is married Garment/textile technologist) and lives at home with his wife.   Patient has two children.   Patient is right-handed.   Patient drinks two cups of coffee daily.           Family History  Problem Relation Age of Onset  . Alzheimer's disease Mother      Review of Systems: All other systems reviewed and are otherwise negative except as noted above.  Physical Exam: Vitals:   05/09/16 1944 05/09/16 2135  05/09/16 2311 05/10/16 1510  BP: 124/79  (!) 148/89 117/67  Pulse: 74  72 (!) 51  Resp: 19  17 20   Temp: (!) 100.8 F (38.2 C) (!) 102.8 F (39.3 C) 98.8 F (37.1 C) 98.6 F (37 C)  TempSrc: Oral Oral Oral   SpO2: 96%  96%   Weight:   (!) 320 lb 8.8 oz (145.4 kg)   Height:   5\' 9"  (1.753 m)     GEN- The patient is obese, well appearing, alert and oriented x 3 today.   HEENT: normocephalic, atraumatic; sclera clear, conjunctiva pink; hearing intact; oropharynx clear; neck supple, no JVP Lymph- no cervical lymphadenopathy Lungs- Clear to ausculation bilaterally, normal work of breathing.  No wheezes, rales, rhonchi Heart- IRRR, no murmurs, rubs or gallops, PMI not laterally displaced GI- soft, non-tender, non-distended Extremities- no clubbing, cyanosis, or edema MS- no significant deformity or atrophy Skin- warm and dry, no rash or lesion Psych- euthymic mood, full affect Neuro- no gross deficits observed  Labs:   Lab Results  Component Value Date   WBC 6.7 05/10/2016   HGB 14.3 05/10/2016   HCT 42.9 05/10/2016   MCV 96.4 05/10/2016   PLT 122 (L) 05/10/2016    Recent Labs Lab 05/10/16 0830  NA 137  K 3.9  CL 108  CO2 21*  BUN 16  CREATININE 1.70*  CALCIUM 8.2*  PROT 6.4*  BILITOT 1.5*  ALKPHOS 74  ALT 45  AST 47*  GLUCOSE 97      Radiology/Studies:  US Abdomen Complete Result Date: 05/10/2016 CLINICAL DATA:  Bacteremia EXAM: ABDOMEN ULTRASOUND COMPLETE COMPARISON:  03/28/2012. FINDINGS: Gallbladder: No gallstones or wall thickening visualized. No sonographic Murphy sign noted by sonographer. Common bile duct: Diameter: 3.8 mm. Liver: No focal lesion identified. Within normal limits in parenchymal echogenicity. IVC: No abnormality visualized. Pancreas: Visualized portion unremarkable. Spleen: Size and appearance within normal limits. Right Kidney: Length: 11.9 cm. There is a 2 cm hypoechoic structure identified adjacent to the lower pole of the right kidney.  This corresponds to a cystic lesion seen on prior exam. Left Kidney: Length: 12.9 cm. Large somewhat bilobed 8.4 cm cystic lesion similar to that seen on prior exam. Abdominal aorta: No aneurysm visualized. Other findings: None. IMPRESSION: Bilateral renal cystic lesions stable from 2013. No other focal abnormality is noted. Electronically Signed   By: Inez Catalina M.D.   On: 05/10/2016 10:50   Dg Chest Portable 1 View Result Date: 05/07/2016 CLINICAL DATA:  Shortness of breath and fever, sepsis. EXAM: PORTABLE CHEST 1 VIEW COMPARISON:  07/01/2015 FINDINGS: Lungs are  adequately inflated without focal consolidation or effusion. Cardiomediastinal silhouette is within normal. There is mild degenerate change of the spine. IMPRESSION: No active disease. Electronically Signed   By: Marin Olp M.D.   On: 05/07/2016 22:13    EKG: Presenting EKG is SR, 99bpm,  LAD, 1st degree AVblock, PR 253ms, QRS 150ms, QTc 428ms Current: AFib, 60bpm TELEMETRY: Afib currently 60's, transiently bradycardia 50's, brief pauses 2-2.3 seconds, no persistent bradycardia, initially in SR, had some intermittent blocked PAC's, and again transient     Assessment and Plan:   1. Pauses     Longest noted 2.3 seconds     No symptoms, stable BP     No prolonged or persistent bradycardic rates     No changes, resume verapamil, continue flecainide and follow up out patient with Dr. Einar Gip once discharged     1st degree AVblock appears unchanged for years, narrow QRS  2. Thought to be permanent AFib, in SR on presentation      CHA2DS2Vasc is one, maintained on warfarin      INR therapeutic  3. HTN     Stable, no changes  4. Febrile illness     Septic     Doing better     C/w IM service      Signed, Tommye Standard, PA-C 05/10/2016 4:22 PM    I have seen and examined this patient with Tommye Standard.  Agree with above, note added to reflect my findings.  On exam, regular rhythm, no murmurs, lungs clear. Presented to the  hospital with fever found to have Escherichia coli sepsis. Went into atrial fibrillation, with a known history of atrial fibrillation and status post ablation in [place] years ago. He has been having up to 2 second pauses in his atrial fibrillation. At this time, he is asymptomatic from these pauses. If his pauses length and out up to 5 seconds, would consider medication adjustment. As he is in atrial fibrillation and on flecainide, it is possible that since he is over his acute illness that he Harlei Lehrmann go back to sinus rhythm. Would have him follow-up in clinic with his outpatient cardiologist, Dr. Einar Gip.  Joby Richart M. Synia Douglass MD 05/10/2016 5:16 PM

## 2016-05-10 NOTE — Progress Notes (Addendum)
[facility]                                                                              Patient Demographics  Ricky Burton, is a 56 y.o. male, DOB - 1961/02/04, [id number]  Admit date - 05/07/2016   Admitting Physician Vianne Bulls, MD  Outpatient Primary MD for the patient is Jilda Panda, MD  Outpatient specialists:   LOS - 3  days    Chief Complaint  Patient presents with  . Fever  . Shortness of Breath       Brief summary   56 year old morbidly obese male with history of chronic A. fib on Coumadin, depression, hypertension, OSA presented to the ED with fever of almost 104F, chills, diaphoresis and malaise for 1 day duration. In the ED was septic with fever, hypotension and tachypnea. Also had leukocytosis with elevated lactic acid and acute kidney injury. Initial workup including chest x-ray, UA and flu PCR were all negative. Admitted to stepdown unit for sepsis and placed on empiric antibiotics.   Assessment & Plan   Escherichia coli Sepsis (HCC) - No clear source. Likely urinary or abdominal  - Blood culture on admission growing  Escherichia coli, pansensitive - Patient was placed on IV Zosyn. Flu PCR and respiratory viral panel negative - UA, culture negative, chest x-ray negative for any pneumonia. - Patient reported that he did have one episode of diarrhea on the day of admission after eating a burger at the [employer] spiking fevers overnight, Tmax 102.8, recommended CT of the abdomen, patient adamantly refused to CT due to extreme claustrophobia. Agreeable for ultrasound of the abdomen. Ultrasound of the abdomen negative for any cholecystitis, cholelithiasis or CBD stone or any renal abscess/pyelonephritis. - Discussed with ID, Dr. Linus Salmons recommended no further workup as patient is doing well, no leukocytosis or any other symptoms. Antibiotics changed to IV Rocephin per sensitivities. Repeat blood cultures today - If patient has no  more fevers in the next 24 hours will change to oral antibiotics for total of 7-10 days and hopefully DC home in a.m.  Active Problems:   Atrial fibrillation (HCC) Rate controlled. Continue verapamil , flecainide and Coumadin. Addendum: 4:00pm Called by RN, central tele, patient had 3 episodes of 2 seconds pauses in last 61mins  - Hold verapamil. Called EP cardiology consult.   Hypotension - Resolved Secondary to sepsis - Discontinued IV fluids.  Acute kidney injury - Possibly due to sepsis. Patient also on valsartan and taking Advil when necessary for pain.  - Hold ARB and avoid nephrotoxins. -  Continue hydration.  Chronic depression Continue Paxil.    OSA (obstructive sleep apnea)   Morbid obesity Counseled on diet and exercise to lose weight.   Code Status:  Full code  DVT Prophylaxis:  Heparin  Family Communication: Discussed in detail with the patient, all imaging results, lab results explained to the patient and wife    Disposition Plan: Hopefully DC home in a.m. if he does not spike any fevers in next 24 hours  Time Spent in minutes   25 minutes  Procedures:    Consultants:  Antimicrobials:   IV Rocephin 1/31   Medications  Scheduled Meds: . cefTRIAXone (ROCEPHIN)  IV  2 g Intravenous Q24H  . flecainide  100 mg Oral BID  . ibuprofen  400 mg Oral Once  . PARoxetine  5 mg Oral Daily  . rosuvastatin  10 mg Oral q1800  . sodium chloride  500 mL Intravenous Once  . sodium chloride flush  3 mL Intravenous Q12H  . verapamil  180 mg Oral Daily  . warfarin  5 mg Oral q1800  . Warfarin - Pharmacist Dosing Inpatient   Does not apply q1800   Continuous Infusions: PRN Meds:.acetaminophen **OR** acetaminophen, bisacodyl, HYDROcodone-acetaminophen, ondansetron **OR** ondansetron (ZOFRAN) IV, polyethylene glycol   Antibiotics   Anti-infectives    Start     Dose/Rate Route Frequency Ordered Stop   05/10/16 1500  cefTRIAXone (ROCEPHIN) 2 g in  dextrose 5 % 50 mL IVPB     2 g 100 mL/hr over 30 Minutes Intravenous Every 24 hours 05/10/16 1115     05/08/16 2200  vancomycin (VANCOCIN) 1,500 mg in sodium chloride 0.9 % 500 mL IVPB  Status:  Discontinued     1,500 mg 250 mL/hr over 120 Minutes Intravenous Every 24 hours 05/08/16 0013 05/08/16 1404   05/08/16 0600  piperacillin-tazobactam (ZOSYN) IVPB 3.375 g  Status:  Discontinued     3.375 g 12.5 mL/hr over 240 Minutes Intravenous Every 8 hours 05/08/16 0013 05/10/16 1115   05/08/16 0015  oseltamivir (TAMIFLU) capsule 30 mg  Status:  Discontinued     30 mg Oral  Once 05/08/16 0002 05/08/16 1400   05/07/16 2300  vancomycin (VANCOCIN) 2,000 mg in sodium chloride 0.9 % 500 mL IVPB     2,000 mg 250 mL/hr over 120 Minutes Intravenous  Once 05/07/16 2239 05/08/16 0115   05/07/16 2245  cefTRIAXone (ROCEPHIN) 1 g in dextrose 5 % 50 mL IVPB  Status:  Discontinued     1 g 100 mL/hr over 30 Minutes Intravenous  Once 05/07/16 2231 05/07/16 2238   05/07/16 2245  piperacillin-tazobactam (ZOSYN) IVPB 3.375 g     3.375 g 100 mL/hr over 30 Minutes Intravenous  Once 05/07/16 2238 05/07/16 2318   05/07/16 2245  vancomycin (VANCOCIN) IVPB 1000 mg/200 mL premix  Status:  Discontinued     1,000 mg 200 mL/hr over 60 Minutes Intravenous  Once 05/07/16 2238 05/07/16 2239        Subjective:   Ricky Burton was seen and examined today.  Still spiking fevers, overnight Tmax 102.8. Patient denies dizziness, chest pain, shortness of breath, abdominal pain, N/V/D/C, new weakness, numbess, tingling.   Objective:   Vitals:   05/09/16 1915 05/09/16 1944 05/09/16 2135 05/09/16 2311  BP:  124/79  (!) 148/89  Pulse:  74  72  Resp:  19  17  Temp: (!) 100.6 F (38.1 C) (!) 100.8 F (38.2 C) (!) 102.8 F (39.3 C) 98.8 F (37.1 C)  TempSrc: Oral Oral Oral Oral  SpO2:  96%  96%  Weight:    (!) 145.4 kg (320 lb 8.8 oz)  Height:    5\' 9"  (1.753 m)    Intake/Output Summary (Last 24 hours) at 05/10/16  1304 Last data filed at 05/09/16 1411  Gross per 24 hour  Intake               50 ml  Output                0 ml  Net  50 ml     Wt Readings from Last 3 Encounters:  05/09/16 (!) 145.4 kg (320 lb 8.8 oz)  11/07/12 (!) 145.2 kg (320 lb)  11/06/12 (!) 158.8 kg (350 lb)     Exam  General: Alert and oriented x 3, NAD  HEENT:    Neck: Supple, no JVD, no masses  Cardiovascular: S1 S2 auscultated, no rubs, murmurs or gallops. Regular rate and rhythm.  Respiratory: Clear to auscultation bilaterally, no wheezing, rales or rhonchi  Gastrointestinal: obese, Soft, nontender, nondistended, + bowel sounds  Ext: no cyanosis clubbing or edema  Neuro: AAOx3, Cr N's II- XII. Strength 5/5 upper and lower extremities bilaterally  Skin: No rashes  Psych: Normal affect and demeanor, alert and oriented x3    Data Reviewed:  I have personally reviewed following labs and imaging studies  Micro Results Recent Results (from the past 240 hour(s))  Culture, blood (Routine x 2)     Status: Abnormal   Collection Time: 05/07/16  8:58 PM  Result Value Ref Range Status   Specimen Description BLOOD RIGHT HAND  Final   Special Requests BOTTLES DRAWN AEROBIC AND ANAEROBIC 5CC  Final   Culture  Setup Time   Final    GRAM NEGATIVE RODS AEROBIC BOTTLE ONLY CRITICAL VALUE NOTED.  VALUE IS CONSISTENT WITH PREVIOUSLY REPORTED AND CALLED VALUE.    Culture (A)  Final    ESCHERICHIA COLI SUSCEPTIBILITIES PERFORMED ON PREVIOUS CULTURE WITHIN THE LAST 5 DAYS.    Report Status 05/10/2016 FINAL  Final  Culture, blood (Routine x 2)     Status: Abnormal   Collection Time: 05/07/16  9:12 PM  Result Value Ref Range Status   Specimen Description BLOOD LEFT HAND  Final   Special Requests BOTTLES DRAWN AEROBIC AND ANAEROBIC 5CC  Final   Culture  Setup Time   Final    GRAM NEGATIVE RODS IN BOTH AEROBIC AND ANAEROBIC BOTTLES Organism ID to follow CRITICAL RESULT CALLED TO, READ BACK BY AND  VERIFIED WITH: N.  Batchelder Pharm.D. 13:00 05/08/16  ([email or web address])    Culture ESCHERICHIA COLI (A)  Final   Report Status 05/10/2016 FINAL  Final   Organism ID, Bacteria ESCHERICHIA COLI  Final      Susceptibility   Escherichia coli - MIC*    AMPICILLIN 8 SENSITIVE Sensitive     CEFAZOLIN <=4 SENSITIVE Sensitive     CEFEPIME <=1 SENSITIVE Sensitive     CEFTAZIDIME <=1 SENSITIVE Sensitive     CEFTRIAXONE <=1 SENSITIVE Sensitive     CIPROFLOXACIN <=0.25 SENSITIVE Sensitive     GENTAMICIN <=1 SENSITIVE Sensitive     IMIPENEM <=0.25 SENSITIVE Sensitive     TRIMETH/SULFA <=20 SENSITIVE Sensitive     AMPICILLIN/SULBACTAM <=2 SENSITIVE Sensitive     PIP/TAZO <=4 SENSITIVE Sensitive     Extended ESBL NEGATIVE Sensitive     * ESCHERICHIA COLI  Blood Culture ID Panel (Reflexed)     Status: Abnormal   Collection Time: 05/07/16  9:12 PM  Result Value Ref Range Status   Enterococcus species NOT DETECTED NOT DETECTED Final   Listeria monocytogenes NOT DETECTED NOT DETECTED Final   Staphylococcus species NOT DETECTED NOT DETECTED Final   Staphylococcus aureus NOT DETECTED NOT DETECTED Final   Streptococcus species NOT DETECTED NOT DETECTED Final   Streptococcus agalactiae NOT DETECTED NOT DETECTED Final   Streptococcus pneumoniae NOT DETECTED NOT DETECTED Final   Streptococcus pyogenes NOT DETECTED NOT DETECTED Final   Acinetobacter baumannii NOT DETECTED NOT DETECTED Final  Enterobacteriaceae species DETECTED (A) NOT DETECTED Final    Comment: CRITICAL RESULT CALLED TO, READ BACK BY AND VERIFIED WITH: N. Batchelder Pharm.D. 13:00 05/08/16 ([email or web address])    Enterobacter cloacae complex NOT DETECTED NOT DETECTED Final   Escherichia coli DETECTED (A) NOT DETECTED Final    Comment: CRITICAL RESULT CALLED TO, READ BACK BY AND VERIFIED WITH: N. Batchelder Pharm.D. 13:00 05/08/16 ([email or web address])    Klebsiella oxytoca NOT DETECTED NOT DETECTED Final   Klebsiella pneumoniae NOT DETECTED NOT DETECTED Final    Proteus species NOT DETECTED NOT DETECTED Final   Serratia marcescens NOT DETECTED NOT DETECTED Final   Carbapenem resistance NOT DETECTED NOT DETECTED Final   Haemophilus influenzae NOT DETECTED NOT DETECTED Final   Neisseria meningitidis NOT DETECTED NOT DETECTED Final   Pseudomonas aeruginosa NOT DETECTED NOT DETECTED Final   Candida albicans NOT DETECTED NOT DETECTED Final   Candida glabrata NOT DETECTED NOT DETECTED Final   Candida krusei NOT DETECTED NOT DETECTED Final   Candida parapsilosis NOT DETECTED NOT DETECTED Final   Candida tropicalis NOT DETECTED NOT DETECTED Final  Urine culture     Status: Abnormal   Collection Time: 05/08/16 12:16 AM  Result Value Ref Range Status   Specimen Description URINE, CLEAN CATCH  Final   Special Requests NONE  Final   Culture <10,000 COLONIES/mL INSIGNIFICANT GROWTH (A)  Final   Report Status 05/09/2016 FINAL  Final  Respiratory Panel by PCR     Status: None   Collection Time: 05/08/16  1:42 AM  Result Value Ref Range Status   Adenovirus NOT DETECTED NOT DETECTED Final   Coronavirus 229E NOT DETECTED NOT DETECTED Final   Coronavirus HKU1 NOT DETECTED NOT DETECTED Final   Coronavirus NL63 NOT DETECTED NOT DETECTED Final   Coronavirus OC43 NOT DETECTED NOT DETECTED Final   Metapneumovirus NOT DETECTED NOT DETECTED Final   Rhinovirus / Enterovirus NOT DETECTED NOT DETECTED Final   Influenza A NOT DETECTED NOT DETECTED Final   Influenza B NOT DETECTED NOT DETECTED Final   Parainfluenza Virus 1 NOT DETECTED NOT DETECTED Final   Parainfluenza Virus 2 NOT DETECTED NOT DETECTED Final   Parainfluenza Virus 3 NOT DETECTED NOT DETECTED Final   Parainfluenza Virus 4 NOT DETECTED NOT DETECTED Final   Respiratory Syncytial Virus NOT DETECTED NOT DETECTED Final   Bordetella pertussis NOT DETECTED NOT DETECTED Final   Chlamydophila pneumoniae NOT DETECTED NOT DETECTED Final   Mycoplasma pneumoniae NOT DETECTED NOT DETECTED Final  MRSA PCR Screening      Status: None   Collection Time: 05/08/16  1:42 AM  Result Value Ref Range Status   MRSA by PCR NEGATIVE NEGATIVE Final    Comment:        The GeneXpert MRSA Assay (FDA approved for NASAL specimens only), is one component of a comprehensive MRSA colonization surveillance program. It is not intended to diagnose MRSA infection nor to guide or monitor treatment for MRSA infections.     Radiology Reports US Abdomen Complete  Result Date: 05/10/2016 CLINICAL DATA:  Bacteremia EXAM: ABDOMEN ULTRASOUND COMPLETE COMPARISON:  03/28/2012. FINDINGS: Gallbladder: No gallstones or wall thickening visualized. No sonographic Murphy sign noted by sonographer. Common bile duct: Diameter: 3.8 mm. Liver: No focal lesion identified. Within normal limits in parenchymal echogenicity. IVC: No abnormality visualized. Pancreas: Visualized portion unremarkable. Spleen: Size and appearance within normal limits. Right Kidney: Length: 11.9 cm. There is a 2 cm hypoechoic structure identified adjacent to the lower pole of the right kidney. This corresponds  to a cystic lesion seen on prior exam. Left Kidney: Length: 12.9 cm. Large somewhat bilobed 8.4 cm cystic lesion similar to that seen on prior exam. Abdominal aorta: No aneurysm visualized. Other findings: None. IMPRESSION: Bilateral renal cystic lesions stable from 2013. No other focal abnormality is noted. Electronically Signed   By: Inez Catalina M.D.   On: 05/10/2016 10:50   Dg Chest Portable 1 View  Result Date: 05/07/2016 CLINICAL DATA:  Shortness of breath and fever, sepsis. EXAM: PORTABLE CHEST 1 VIEW COMPARISON:  07/01/2015 FINDINGS: Lungs are adequately inflated without focal consolidation or effusion. Cardiomediastinal silhouette is within normal. There is mild degenerate change of the spine. IMPRESSION: No active disease. Electronically Signed   By: Marin Olp M.D.   On: 05/07/2016 22:13    Lab Data:  CBC:  Recent Labs Lab 05/07/16 2105  05/08/16 0314 05/09/16 0214 05/10/16 0830  WBC 16.7* 19.5* 11.0* 6.7  NEUTROABS 14.3* 17.0*  --   --   HGB 16.2 13.8 16.4 14.3  HCT 48.8 42.2 50.0 42.9  MCV 98.4 99.3 99.6 96.4  PLT 129* 117* 122* 123XX123*   Basic Metabolic Panel:  Recent Labs Lab 05/07/16 2105 05/08/16 0314 05/09/16 0214 05/10/16 0714 05/10/16 0830  NA 136 139 140 138 137  K 4.2 5.0 4.1 4.0 3.9  CL 106 110 107 109 108  CO2 19* 23 24 22  21*  GLUCOSE 132* 124* 118* 100* 97  BUN 20 17 16 15 16   CREATININE 1.86* 1.84* 1.78* 1.77* 1.70*  CALCIUM 8.7* 7.9* 8.7* 8.2* 8.2*   GFR: Estimated Creatinine Clearance: 68.2 mL/min (by C-G formula based on SCr of 1.7 mg/dL (H)). Liver Function Tests:  Recent Labs Lab 05/07/16 2105 05/10/16 0830  AST 23 47*  ALT 21 45  ALKPHOS 69 74  BILITOT 1.8* 1.5*  PROT 7.4 6.4*  ALBUMIN 3.5 2.5*   No results for input(s): LIPASE, AMYLASE in the last 168 hours. No results for input(s): AMMONIA in the last 168 hours. Coagulation Profile:  Recent Labs Lab 05/07/16 2105 05/08/16 0314 05/09/16 0214 05/10/16 0714  INR 2.34 2.59 2.65 2.29   Cardiac Enzymes: No results for input(s): CKTOTAL, CKMB, CKMBINDEX, TROPONINI in the last 168 hours. BNP (last 3 results) No results for input(s): PROBNP in the last 8760 hours. HbA1C: No results for input(s): HGBA1C in the last 72 hours. CBG:  Recent Labs Lab 05/08/16 0842 05/09/16 0752  GLUCAP 86 85   Lipid Profile: No results for input(s): CHOL, HDL, LDLCALC, TRIG, CHOLHDL, LDLDIRECT in the last 72 hours. Thyroid Function Tests: No results for input(s): TSH, T4TOTAL, FREET4, T3FREE, THYROIDAB in the last 72 hours. Anemia Panel: No results for input(s): VITAMINB12, FOLATE, FERRITIN, TIBC, IRON, RETICCTPCT in the last 72 hours. Urine analysis:    Component Value Date/Time   COLORURINE YELLOW 05/08/2016 0016   APPEARANCEUR CLEAR 05/08/2016 0016   LABSPEC 1.020 05/08/2016 0016   PHURINE 5.0 05/08/2016 0016   GLUCOSEU  NEGATIVE 05/08/2016 0016   HGBUR NEGATIVE 05/08/2016 0016   BILIRUBINUR NEGATIVE 05/08/2016 0016   KETONESUR NEGATIVE 05/08/2016 0016   PROTEINUR 30 (A) 05/08/2016 0016   NITRITE NEGATIVE 05/08/2016 0016   LEUKOCYTESUR SMALL (A) 05/08/2016 0016     RAI,RIPUDEEP M.D. [facility] 05/10/2016, 1:04 PM  Pager: [phone] Between 7am to 7pm - call Pager - [phone]  After 7pm go to [email or web address] - password [email or web address]  Call night coverage person covering after 7pm

## 2016-05-10 NOTE — Progress Notes (Signed)
ANTICOAGULATION CONSULT NOTE - Follow Up Consult  Pharmacy Consult for Coumadin Indication: atrial fibrillation  Allergies  Allergen Reactions  . Advair Diskus [Fluticasone-Salmeterol] Palpitations    Patient Measurements: Height: 5\' 9"  (175.3 cm) Weight: (!) 320 lb 8.8 oz (145.4 kg) IBW/kg (Calculated) : 70.7  Vital Signs: Temp: 98.8 F (37.1 C) (01/30 2311) Temp Source: Oral (01/30 2311) BP: 148/89 (01/30 2311) Pulse Rate: 72 (01/30 2311)  Labs:  Recent Labs  05/08/16 0314 05/09/16 0214 05/10/16 0714 05/10/16 0830  HGB 13.8 16.4  --  14.3  HCT 42.2 50.0  --  42.9  PLT 117* 122*  --  122*  LABPROT 28.2* 28.8* 25.6*  --   INR 2.59 2.65 2.29  --   CREATININE 1.84* 1.78* 1.77* 1.70*    Estimated Creatinine Clearance: 68.2 mL/min (by C-G formula based on SCr of 1.7 mg/dL (H)).   Medications:  Scheduled:  . flecainide  100 mg Oral BID  . ibuprofen  400 mg Oral Once  . PARoxetine  5 mg Oral Daily  . piperacillin-tazobactam (ZOSYN)  IV  3.375 g Intravenous Q8H  . rosuvastatin  10 mg Oral q1800  . sodium chloride  500 mL Intravenous Once  . sodium chloride flush  3 mL Intravenous Q12H  . verapamil  180 mg Oral Daily  . Warfarin - Pharmacist Dosing Inpatient   Does not apply q1800    Assessment: 56yo male with AFib.  INR wnl.  No bleeding noted.  Will resume home dose.  Goal of Therapy:  INR 2-3 Monitor platelets by anticoagulation protocol: Yes   Plan:  Coumadin 5mg  qday Daily INR  Gracy Bruins, PharmD [facility]

## 2016-05-11 DIAGNOSIS — I10 Essential (primary) hypertension: Secondary | ICD-10-CM

## 2016-05-11 DIAGNOSIS — G4733 Obstructive sleep apnea (adult) (pediatric): Secondary | ICD-10-CM

## 2016-05-11 LAB — PROTIME-INR
INR: 2.7
Prothrombin Time: 29.3 seconds — ABNORMAL HIGH (ref 11.4–15.2)

## 2016-05-11 MED ORDER — VALSARTAN 320 MG PO TABS: 320.0000 mg | ORAL_TABLET | Freq: Every day | ORAL | Status: DC

## 2016-05-11 MED ORDER — CIPROFLOXACIN HCL 500 MG PO TABS
500.0000 mg | ORAL_TABLET | Freq: Two times a day (BID) | ORAL | Status: DC
  Administered 2016-05-11: 500 mg via ORAL
  Filled 2016-05-11: qty 1

## 2016-05-11 MED ORDER — CIPROFLOXACIN HCL 500 MG PO TABS: 500.0000 mg | ORAL_TABLET | Freq: Two times a day (BID) | ORAL | 0 refills | Status: DC

## 2016-05-11 NOTE — Care Management Important Message (Signed)
Important Message  Patient Details  Name: Ricky Burton MRN: [id number] Date of Birth: 09/05/1960   Medicare Important Message Given:  Yes    [name] Abena 05/11/2016, 12:05 PM

## 2016-05-11 NOTE — Progress Notes (Signed)
ANTICOAGULATION CONSULT NOTE - Follow Up Consult  Pharmacy Consult for Coumadin Indication: atrial fibrillation  Allergies  Allergen Reactions  . Advair Diskus [Fluticasone-Salmeterol] Palpitations    Patient Measurements: Height: 5\' 9"  (175.3 cm) Weight: (!) 313 lb 6.4 oz (142.2 kg) IBW/kg (Calculated) : 70.7  Vital Signs: Temp: 97.5 F (36.4 C) (02/01 1037) Temp Source: Oral (02/01 1037) BP: 122/87 (02/01 1037) Pulse Rate: 66 (02/01 1037)  Labs:  Recent Labs  05/09/16 0214 05/10/16 0714 05/10/16 0830 05/11/16 0454  HGB 16.4  --  14.3  --   HCT 50.0  --  42.9  --   PLT 122*  --  122*  --   LABPROT 28.8* 25.6*  --  29.3*  INR 2.65 2.29  --  2.70  CREATININE 1.78* 1.77* 1.70*  --     Estimated Creatinine Clearance: 67.3 mL/min (by C-G formula based on SCr of 1.7 mg/dL (H)).   Medications:  Scheduled:  . ciprofloxacin  500 mg Oral BID  . flecainide  100 mg Oral Q12H  . ibuprofen  400 mg Oral Once  . PARoxetine  5 mg Oral Daily  . rosuvastatin  10 mg Oral q1800  . sodium chloride  500 mL Intravenous Once  . sodium chloride flush  3 mL Intravenous Q12H  . verapamil  180 mg Oral Daily  . Warfarin - Pharmacist Dosing Inpatient   Does not apply q1800    Assessment: 56yo male with AFib.  INR wnl, though increased and on antibiotics.  Pt did receive total Coumadin 10mg  on 1/29 (in ED early AM and then at [facility]).  No bleeding noted.    Goal of Therapy:  INR 2-3 Monitor platelets by anticoagulation protocol: Yes   Plan:  Continue Coumadin 5mg  po daily Watch for s/s of bleeding  Gracy Bruins, PharmD [facility]

## 2016-05-11 NOTE — Progress Notes (Signed)
Patient given discharge papers and prescriptions.  Patient PIV removed site CDI.  Patient verbalized understanding of discharge instructions.   Discharge vitals Vitals:   05/11/16 0549 05/11/16 1037  BP: 134/83 122/87  Pulse: 68 66  Resp: 18   Temp: 97.7 F (36.5 C) 97.5 F (36.4 C)   Discharge meds Allergies as of 05/11/2016      Reactions   Advair Diskus [fluticasone-salmeterol] Palpitations      Medication List    TAKE these medications   ciprofloxacin 500 MG tablet Commonly known as:  CIPRO Take 1 tablet (500 mg total) by mouth 2 (two) times daily. X 10 days   COUMADIN 5 MG tablet Generic drug:  warfarin Take 5 mg by mouth daily.   CRESTOR 10 MG tablet Generic drug:  rosuvastatin Take 10 mg by mouth daily.   febuxostat 40 MG tablet Commonly known as:  ULORIC Take 40 mg by mouth daily.   flecainide 100 MG tablet Commonly known as:  TAMBOCOR Take 100 mg by mouth 2 (two) times daily.   ibuprofen 200 MG tablet Commonly known as:  ADVIL,MOTRIN Take 200 mg by mouth every 6 (six) hours as needed for fever.   PARoxetine 10 MG tablet Commonly known as:  PAXIL Take 5 mg by mouth daily.   valsartan 320 MG tablet Commonly known as:  DIOVAN Take 1 tablet (320 mg total) by mouth daily. HOLD until follow-up with your doctor What changed:  additional instructions   verapamil 180 MG (CO) 24 hr tablet Commonly known as:  COVERA HS Take 180 mg by mouth daily.      Patient escorted to vehicle.    Forrestine Him, RN [phone]

## 2016-05-11 NOTE — Discharge Instructions (Addendum)

## 2016-05-11 NOTE — Discharge Summary (Signed)
Physician Discharge Summary   Patient ID: Ricky Burton MRN: [id number] DOB/AGE: [birth year] 58 y.o.  Admit date: 05/07/2016 Discharge date: 05/11/2016  Primary Care Physician:  Jilda Panda, MD  Discharge Diagnoses:   . Escherichia coli Sepsis ([place]) . OSA (obstructive sleep apnea) . Atrial fibrillation ([place]) . Hypertension . Hypotension . Sinus pauses . Depression   Consults:  Cardiology, Dr. Curt Bears ID, Dr. Linus Salmons via phone consultation  Recommendations for Outpatient Follow-up:  Please repeat CBC/BMET at next visit  DIET: Heart healthy diet    Allergies:   Allergies  Allergen Reactions  . Advair Diskus [Fluticasone-Salmeterol] Palpitations     DISCHARGE MEDICATIONS: Discharge Medication List as of 05/11/2016 11:39 AM    START taking these medications   Details  ciprofloxacin (CIPRO) 500 MG tablet Take 1 tablet (500 mg total) by mouth 2 (two) times daily. X 10 days, Starting Thu 05/11/2016, Print      CONTINUE these medications which have CHANGED   Details  valsartan (DIOVAN) 320 MG tablet Take 1 tablet (320 mg total) by mouth daily. HOLD until follow-up with your doctor, Starting Thu 05/11/2016, No Print      CONTINUE these medications which have NOT CHANGED   Details  febuxostat (ULORIC) 40 MG tablet Take 40 mg by mouth daily., Historical Med    flecainide (TAMBOCOR) 100 MG tablet Take 100 mg by mouth 2 (two) times daily., Starting Wed 03/01/2016, Historical Med    ibuprofen (ADVIL,MOTRIN) 200 MG tablet Take 200 mg by mouth every 6 (six) hours as needed for fever., Historical Med    PARoxetine (PAXIL) 10 MG tablet Take 5 mg by mouth daily., Historical Med    rosuvastatin (CRESTOR) 10 MG tablet Take 10 mg by mouth daily., Historical Med    verapamil (COVERA HS) 180 MG (CO) 24 hr tablet Take 180 mg by mouth daily., Historical Med    warfarin (COUMADIN) 5 MG tablet Take 5 mg by mouth daily., Historical Med         Brief H and P: For complete details  please refer to admission H and P, but in brief  56 year old morbidly obese male with history of chronic A. fib on Coumadin, depression, hypertension, OSA presented to the ED with fever of almost 104F, chills, diaphoresis and malaise for 1 day duration. In the ED was septic with fever, hypotension and tachypnea. Also had leukocytosis with elevated lactic acid and acute kidney injury. Initial workup including chest x-ray, UA and flu PCR were all negative. Admitted to stepdown unit for sepsis and placed on empiric antibiotics.   Hospital Course:  Escherichia coli Sepsis ([place]) - No clear source. Likely urinary or abdominal  - Blood culture on admission growing  Escherichia coli, pansensitive - Patient was placed on IV Zosyn. Flu PCR and respiratory viral panel negative - UA, culture negative, chest x-ray negative for any pneumonia. - Patient reported that he did have one episode of diarrhea on the day of admission after eating a burger at the [facility] - He did spike fevers off and on during the admission , recommended CT of the abdomen, patient adamantly refused to CT due to extreme claustrophobia. Agreeable for ultrasound of the abdomen. Ultrasound of the abdomen negative for any cholecystitis, cholelithiasis or CBD stone or any renal abscess/pyelonephritis. - Discussed with ID, Dr. Linus Salmons recommended no further workup as patient is doing well, no leukocytosis or any other symptoms. Antibiotics changed to IV Rocephin per sensitivities. Repeat blood cultures negative. -Antibiotics changed to oral ciprofloxacin for  a total of 10 days per sensitivities.   Atrial fibrillation ([place])  Rate controlled. During hospitalization, patient was having up to 2 second pauses in his atrial fibrillation otherwise asymptomatic from the pauses. EP cardiology was consulted. Patient was seen by Dr. Curt Bears who recommended if his pauses up to 5 seconds, would consider medication adjustment. Currently no other  recommendations. As he has Afib and on flecainide, it is possible that since he is over his acute illness he will go back to normal sinus rhythm. Recommended follow-up with his own cardiologist, Dr. Einar Gip  Hypotension - Resolved Secondary to sepsis - Discontinued IV fluids.  Acute kidney injury - Possibly due to sepsis. Patient also on valsartan and taking Advil when necessary for pain.  - Hold ARB and avoid nephrotoxins. -Patient was placed on IV fluid hydration, creatinine improved to 1.7, 1.8 at the time of admission. Patient was commended to hold Diovan until follow-up with his PCP.  Chronic depression Continue Paxil.  OSA (obstructive sleep apnea)   Morbid obesity Counseled on diet and exercise to lose weight.  Day of Discharge BP 122/87   Pulse 66   Temp 97.5 F (36.4 C) (Oral)   Resp 18   Ht 5\' 9"  (1.753 m)   Wt (!) 142.2 kg (313 lb 6.4 oz)   SpO2 95%   BMI 46.28 kg/m   Physical Exam: General: Alert and awake oriented x3 not in any acute distress. HEENT: anicteric sclera, pupils reactive to light and accommodation CVS: S1-S2 clear no murmur rubs or gallops Chest: clear to auscultation bilaterally, no wheezing rales or rhonchi Abdomen: soft nontender, nondistended, normal bowel sounds Extremities: no cyanosis, clubbing or edema noted bilaterally Neuro: Cranial nerves II-XII intact, no focal neurological deficits   The results of significant diagnostics from this hospitalization (including imaging, microbiology, ancillary and laboratory) are listed below for reference.    LAB RESULTS: Basic Metabolic Panel:  Recent Labs Lab 05/10/16 0714 05/10/16 0830 05/10/16 1655  NA 138 137  --   K 4.0 3.9  --   CL 109 108  --   CO2 22 21*  --   GLUCOSE 100* 97  --   BUN 15 16  --   CREATININE 1.77* 1.70*  --   CALCIUM 8.2* 8.2*  --   MG  --   --  1.8   Liver Function Tests:  Recent Labs Lab 05/07/16 2105 05/10/16 0830  AST 23 47*  ALT 21 45   ALKPHOS 69 74  BILITOT 1.8* 1.5*  PROT 7.4 6.4*  ALBUMIN 3.5 2.5*   No results for input(s): LIPASE, AMYLASE in the last 168 hours. No results for input(s): AMMONIA in the last 168 hours. CBC:  Recent Labs Lab 05/08/16 0314 05/09/16 0214 05/10/16 0830  WBC 19.5* 11.0* 6.7  NEUTROABS 17.0*  --   --   HGB 13.8 16.4 14.3  HCT 42.2 50.0 42.9  MCV 99.3 99.6 96.4  PLT 117* 122* 122*   Cardiac Enzymes: No results for input(s): CKTOTAL, CKMB, CKMBINDEX, TROPONINI in the last 168 hours. BNP: Invalid input(s): POCBNP CBG:  Recent Labs Lab 05/08/16 0842 05/09/16 0752  GLUCAP 86 85    Significant Diagnostic Studies:  Dg Chest Portable 1 View  Result Date: 05/07/2016 CLINICAL DATA:  Shortness of breath and fever, sepsis. EXAM: PORTABLE CHEST 1 VIEW COMPARISON:  07/01/2015 FINDINGS: Lungs are adequately inflated without focal consolidation or effusion. Cardiomediastinal silhouette is within normal. There is mild degenerate change of the spine. IMPRESSION: No  active disease. Electronically Signed   By: Marin Olp M.D.   On: 05/07/2016 22:13    2D ECHO:   Disposition and Follow-up: Discharge Instructions    Diet - low sodium heart healthy    Complete by:  As directed    Increase activity slowly    Complete by:  As directed        DISPOSITION: Home   DISCHARGE FOLLOW-UP Follow-up Information    MOREIRA,ROY, MD Follow up.   Specialty:  Internal Medicine Contact information: [address] [place] [address] [phone]        Adrian Prows, MD. Schedule an appointment as soon as possible for a visit in 10 day(s).   Specialty:  Cardiology Contact information: [address] [place] [address] [phone]            Time spent on Discharge: 25 minutes   Signed:   Juma Oxley M.D. [facility] 05/11/2016, 1:19 PM Pager: [phone]

## 2016-05-15 LAB — CULTURE, BLOOD (ROUTINE X 2)
CULTURE: NO GROWTH
Culture: NO GROWTH

## 2016-05-17 DIAGNOSIS — Z87440 Personal history of urinary (tract) infections: Secondary | ICD-10-CM | POA: Diagnosis not present

## 2016-05-17 DIAGNOSIS — I480 Paroxysmal atrial fibrillation: Secondary | ICD-10-CM | POA: Diagnosis not present

## 2016-05-17 DIAGNOSIS — Z7901 Long term (current) use of anticoagulants: Secondary | ICD-10-CM | POA: Diagnosis not present

## 2016-05-17 DIAGNOSIS — I119 Hypertensive heart disease without heart failure: Secondary | ICD-10-CM | POA: Diagnosis not present

## 2016-05-19 DIAGNOSIS — N183 Chronic kidney disease, stage 3 (moderate): Secondary | ICD-10-CM | POA: Diagnosis not present

## 2016-05-19 DIAGNOSIS — I480 Paroxysmal atrial fibrillation: Secondary | ICD-10-CM | POA: Diagnosis not present

## 2016-05-19 DIAGNOSIS — Z8679 Personal history of other diseases of the circulatory system: Secondary | ICD-10-CM | POA: Diagnosis not present

## 2016-05-25 DIAGNOSIS — I480 Paroxysmal atrial fibrillation: Secondary | ICD-10-CM | POA: Diagnosis not present

## 2016-05-25 DIAGNOSIS — Z8679 Personal history of other diseases of the circulatory system: Secondary | ICD-10-CM | POA: Diagnosis not present

## 2016-05-27 NOTE — H&P (Signed)
OFFICE VISIT NOTES COPIED TO EPIC FOR DOCUMENTATION  . History of Present Illness Ricky Maine FNP-C; 05/19/2016 3:56 PM) Patient words: Last O/V 02/08/2016:F/U from hospital: Pt denies any symptoms today.  The patient is a 56 year old male who presents for a follow-up for Follow-up for Atrial fibrillation.  Additional reasons for visit:  Follow-up for Atrial flutter is described as the following: He has a history of hypertension with CKD stage III, hyperlipidemia, morbid obesity with obstructive sleep apnea on BiPAP, and history of CVA in 2001. Also has history of atrial flutter status post ablation in 2003 in [place], but has had frequent episodes of atrial tachycardia in 2006 and 2011, since then has been placed on Coumadin. This is managed by his PCP.  Patient was previously admitted to the hospital on 05/07/2016 and discharged on 05/11/16 for Escherichia coli sepsis. He was found to be in A. fib. During his hospitalization he was found to have 2-3 second pauses but was otherwise asymptomatic from the pauses. He was seen by Dr. Curt Bears in the hospital, who recommended that if the pauses lasted up to 5 seconds, he would consider flecanide adjustment, but otherwise no changes were made to medications. It was recommended that he follow-up with [place] for evaluation.  He also had acute kidney injury during his hospitalization likely secondary to sepsis. He was previously managed on valsartan for blood pressure but it was recommended that this be held due to his kidney injury. All this has resolved since. C/O Fatigue and occasional palpitations. Wife present at bedside.    Problem Burton/Past Medical Ricky Burton; 05/19/2016 1:22 PM) Shortness of breath at rest (R06.02)  Labwork  Labs 05/04/2016: Cholesterol 107, triglycerides 73, HDL 39, LDL 53. Potassium 4.7, creatinine 1.8, EGFR 39, CMP normal. 08/24/2015: Potassium 4.7, serum glucose 97 mg, BUN 29, serum creatinine 1166. CMP normal. EGFR 52  mL. Hemoglobin 15.0/hematocrit 46.0. Platelet count 160. Normal indicis. Total cholesterol 99, triglycerides 55, HDL 44, LDL 45. 06/17/2015: Creatinine 1.46, eGFR 61, potassium 4.2, CMP otherwise normal, CBC normal, INR 2.56, total cholesterol 99, triglycerides 69, HDL 40, direct LDL 49, CK elevated at 357, CRP 0.6 05/25/2015: Creatinine 1.44, potassium 4.2, CMP otherwise normal, CBC normal, INR 2.75 03/29/2015: Creatinine 1.28, eGFR 72, potassium 4.4, CMP normal, CBC normal, total cholesterol 85, triglycerides 75, HDL 39, direct LDL 35 06/24/2014: Serum glucose 114, creatinine 1.94, eGFR 44, CMP otherwise normal, PLT 115, CBC otherwise normal. No significant chnage from Labs 09/24/2013. 09/24/2013: Total cholesterol 97, triglycerides 79, HDL 40, LDL 38. Vitamin D. BUN 21, serum creatinine 1.40. eGFR 56 mL. CBC normal. Essential hypertension, benign (I10)  Echo 06/13/12: 1. Poor echo window due to patient bodily habitus. Left ventricular cavity is normal in size. Normal global wall motion. Mildly decreased systolic global function. Calculated EF 49%. Visual EF 50-55%. 2. Left atrial cavity is mild to moderately dilated. 3. Right ventricular cavity is mildly enlarged. Normal global wall motion. 4. No significant valvular abnormalities. No significant change compared to 01/04/10. Mixed hyperlipidemia (E78.2)  Morbid obesity due to excess calories (E66.01)  Obstructive sleep apnea, adult (G47.33)  On Bi-PAP and compliant Morbid or severe obesity with alveolar hypoventilation (E66.2)  Chronic kidney disease, stage III (moderate) (N18.3)  Nonspecific abnormal electrocardiogram (ECG) (EKG) (794.31)  Dietary surveillance and counseling (V65.3)  History of cerebrovascular accident with residual deficit (I69.30) 11/24/1999 Left hand weakness residual defect. History of atrial flutter (Z86.79) 03/23/2010 A. Flutter ablation 2003, 2006 Paroxysmal A. Flutter or atrial tachycardia  and placed on flecainide. Patient was  seen by Dr. Virl Axe on 06/01/2010, for episode of palpitation and EKG had revealed SVT/A. Flutter at the rate of 140 bpm. He could not completely exclude presence of coarse atrial fibrillation or atrial flutter and he had suggested possible ablation in future but to continue antiarrhythmic therapy for now. 02/18/10: Nuclear stress images: diaphragmatic and chest wall attenuation. Normal LV systolic function.  Allergies Ricky Burton; 2016-05-27 1:27 PM) No Known Drug Allergies 2016/05/27 (Marked as Inactive) Advair Diskus *ANTIASTHMATIC AND BRONCHODILATOR AGENTS*  tachycardia  Family History Ricky Burton; 05/27/16 1:22 PM) Married; 1 child; Mother 58 has dementia; Father 33 lung cancer, stroke, heart attack  Mother  Deceased. at age 28- no known heart conditions- hx of alzheimers Father  Deceased. at age 58- had many heart conditions and parkinsons disease. Sister 1  In stable health. older- hx of lung cancer, diagnosed 2 weeks ago 10/2014- with MS Brother 1  In good health. older  Social History Ricky Burton; 2016/05/27 1:28 PM) Current tobacco use  Never smoker. Non Drinker/No Alcohol Use  Marital status  Married. Number of Children  1. Living Situation  Lives with spouse.  Past Surgical History Ricky Burton; 27-May-2016 1:22 PM) Hip Fracture & Surgery - Both 1972 Cardiac Ablation 2004  Medication History [name]; 05/27/2016 1:36 PM) Uloric (40MG Tablet, 1 Oral daily, Taken starting 09/2011) Active. Paxil (10MG Tablet, 1 Oral daily) Active. Verapamil HCl ER (180MG Capsule ER 24HR, 1 Oral daily) Active. Warfarin Sodium (5MG Tablet, 1 Oral daily) Active. (Managed by Dr. Nevada Crane.) Diovan (320MG Tablet, 1 Oral daily) Active. ((Increased from 1/2 tablet)) Cialis (5MG Tablet, 1 Oral as needed) Active. Crestor (10MG Tablet, 1 Oral daily, after meals) Active. Flecainide Acetate (100MG Tablet, 1 Oral two times daily) Active. Ciprofloxacin HCl (500MG  Tablet, 1 Oral two times daily) Active. (2 days left in course) Medications Reconciled (verbally with pt/ no Burton or medication present)  Diagnostic Studies History Ricky Burton; 2016/05/27 1:31 PM) EKG: 05/27/10: NSR Low voltage complexes. LAD, non sp T changes. COPD pattern. normal QT.  nuc 02/18/10: No ischemia, normal LV EF. Chest wall and diaphragmatic attenuation. Low risk  Nuclear stress images: diaphragmatic and chest wall attenuation. Normal LV systolic function  Stress EKG tracings  [place] ABD: No Abd Ao Aneurysm. Simple cyst kidney  Echo: Normal LV systolic function. Mild RV dilatation. Moderate biatrial enlargement  Sinus rhythm, left atrial enlargement, inferior MI old,non specific T ST-T changes.  Echocardiogram  Echo 06/13/12: 1. Poor echo window due to patient bodily habitus. Left ventricular cavity is normal in size. Normal global wall motion. Mildly decreased systolic global function. Calculated EF 49%. Visual EF 50-55%. 2. Left atrial cavity is mild to moderately dilated. 3. Right ventricular cavity is mildly enlarged. Normal global wall motion. 4. No significant valvular abnormalities. No significant change compared to 01/04/10. EKG  EKG 05/27/2009: A. Flutter with rappid ventricular response. Inferior infarct old. No change 01/03/10 Ultrasound, Abdomen 05/10/2016 Bilateral renal cystic lesions stable from 2013 No other focal abnormality is noted.  Chest X-ray 05/07/2016 No active disease.  Other Problems Ricky Burton; 05-27-2016 1:22 PM) Stroke 2001  A. Fibrillation ablation 2003  2006 Paroxysmal A. Fib or atrial tachycardia and placed on flecainide.  Echocardiogram findings abnormal, without diagnosis (R93.1) 06/13/2012 Echo 06/13/12: 1. Poor echo window due to patient bodily habitus. Left ventricular cavity is normal in size. Normal global wall motion. Mildly decreased systolic global function. Calculated EF 49%. Visual EF 50-55%. 2.  Left atrial cavity is mild to  moderately dilated. 3. Right ventricular cavity is mildly enlarged. Normal global wall motion. 4. No significant valvular abnormalities. No significant change compared to 01/04/10.    Review of Systems Laverda Page MD; 05/19/2016 2:24 PM) General Present- Fatigue. Not Present- Tiredness and Unable to Sleep Lying Flat. HEENT Not Present- Blurred Vision. Respiratory Present- Difficulty Breathing on Exertion and Snoring. Not Present- Bloody sputum and Wakes up from Sleep Wheezing or Short of Breath. Cardiovascular Not Present- Leg Cramps, Palpitations and Paroxysmal Nocturnal Dyspnea. Gastrointestinal Not Present- Black, Tarry Stool, Bloody Stool and Heartburn. Musculoskeletal Not Present- Claudication and Joint Pain. Neurological Not Present- Focal Neurological Symptoms. Psychiatric Not Present- Personality Changes and Suicidal Ideation. Hematology Not Present- Blood Clots, Easy Bruising and Nose Bleed.  Vitals [name]; 05/19/2016 1:40 PM) 05/19/2016 1:24 PM Weight: 308 lb Height: 69in Body Surface Area: 2.48 m Body Mass Index: 45.48 kg/m  Pulse: 62 (Regular)  P.OX: 96% (Room air) BP: 110/84 (Sitting, Left Arm, Standard)       Physical Exam Laverda Page MD; 05/19/2016 2:24 PM) General Mental Status-Alert. General Appearance-Cooperative, Appears stated age, Not in acute distress. Orientation-Oriented X3. Build & Nutrition-Well built and Morbidly obese.  Head and Neck Thyroid Gland Characteristics - no palpable nodules, no palpable enlargement.  Chest and Lung Exam Palpation Tender - No chest wall tenderness. Auscultation Breath sounds - Clear.  Cardiovascular Inspection Jugular vein - Right - No Distention. Auscultation Heart Sounds - S1 is variable(distant heart sounds), S2 Physiologic splitting and No gallop present. Murmurs & Other Heart Sounds - Murmur - No murmur.  Abdomen Inspection Contour - Obese and Pannus  present. Palpation/Percussion Normal exam - Non Tender and No hepatosplenomegaly. Auscultation Normal exam - Bowel sounds normal.  Peripheral Vascular Lower Extremity Inspection - Left - No Pigmentation, No Varicose veins. Right - No Pigmentation, No Varicose veins. Palpation - Edema - Bilateral - No edema. Femoral pulse - Bilateral - Feeble(Pulsus difficult to feel due to patient's bodily habitus.). Popliteal pulse - Bilateral - Feeble(Pulsus difficult to feel due to patient's bodily habitus.). Dorsalis pedis pulse - Bilateral - Normal. Posterior tibial pulse - Bilateral - Normal. Carotid arteries - Bilateral-No Carotid bruit. Abdomen-No prominent abdominal aortic pulsation, No epigastric bruit.  Neurologic Motor-Grossly intact without any focal deficits.  Musculoskeletal Global Assessment Left Lower Extremity - normal range of motion without pain. Right Lower Extremity - normal range of motion without pain.    Assessment & Plan Ricky Maine FNP-C; 05/19/2016 4:09 PM) Paroxysmal atrial fibrillation (I48.0) CHA2DS2-VASc Score is 3 with yearly risk of stroke of 3.2%.Impression: EKG 05/10/2016: Atrial fibrillation with controlled ventricular response at the rate of 60 bpm, poor R-wave progression, low voltage complexes. Normal QT interval, no evidence of ischemia. Current Plans Started Flecainide Acetate 150MG, 1 Tablet two times daily, #180, 05/19/2016, Ref. x1. Complete electrocardiogram (93000)   05/25/2016: Echocardiography: Left ventricle cavity is normal in size. Moderate concentric hypertrophy of the left ventricle. Normal global wall motion. Normal diastolic filling pattern, normal LAP. Calculated EF 61%. Left atrial cavity is mildly dilated at 4.3,  Right atrial cavity is mildly dilated. Right ventricle cavity is mildly dilated. Normal right ventricular function. Trace tricuspid regurgitation. Unable to estimate PA pressure due to absence/minimal TR signal. Compared  to the study done on 06/13/2012, ejection fraction appears to be improved from 50%.  07/17/8117: METABOLIC PANEL, BASIC (14782) - one time History of atrial flutter (Z86.79) Impression: EKG 05/19/2016: Atrial flutter, typical with variable  AV conduction, ventricular rate 84 bpm. Cannot exclude inferior infarct old. Low voltage complexes. Nonspecific T abnormality. No significant change from EKG 03/23/2010. Morbid obesity due to excess calories (E66.01) Chronic kidney disease, stage III (moderate) (N18.3) Labwork Story: Labs 05/08/2016: Magnesium 1.8. CBC normal. Potassium 3.9, nightly 16, creatinine 1.7, calcium 8.2, AST 47, AST 45, EGFR 50, CMP otherwise normal. INR 2.29.  Labs 05/04/2016: Cholesterol 107, triglycerides 73, HDL 39, LDL 53. Potassium 4.7, creatinine 1.8, EGFR 39, CMP normal.  08/24/2015: Potassium 4.7, serum glucose 97 mg, BUN 29, serum creatinine 1166. CMP normal. EGFR 52 mL. Hemoglobin 15.0/hematocrit 46.0. Platelet count 160. Normal indicis. Total cholesterol 99, triglycerides 55, HDL 44, LDL 45.  06/17/2015: Creatinine 1.46, eGFR 61, potassium 4.2, CMP otherwise normal, CBC normal, INR 2.56, total cholesterol 99, triglycerides 69, HDL 40, direct LDL 49, CK elevated at 357, CRP 0.6  05/25/2015: Creatinine 1.44, potassium 4.2, CMP otherwise normal, CBC normal, INR 2.75  03/29/2015: Creatinine 1.28, eGFR 72, potassium 4.4, CMP normal, CBC normal, total cholesterol 85, triglycerides 75, HDL 39, direct LDL 35  06/24/2014: Serum glucose 114, creatinine 1.94, eGFR 44, CMP otherwise normal, PLT 115, CBC otherwise normal. No significant chnage from Labs 09/24/2013.  09/24/2013: Total cholesterol 97, triglycerides 79, HDL 40, LDL 38. Vitamin D. BUN 21, serum creatinine 1.40. eGFR 56 mL. CBC normal. Essential hypertension, benign (I10)  Current Plans Mechanism of underlying disease process and action of medications discussed with the patient. I discussed primary/secondary prevention and also  dietary counseling was done. Denies any bleeding diathesis on warfarin, INR is followed by PCP. Patient was admitted to [facility] with severe sepsis, septic shock and acute renal failure. He was treated for E. coli UTI, he is presently back in atrial flutter, but had atrial fibrillation while in the hospital. He continues to complain of marked fatigue, this may be related to recent sepsis but his presentation with atrial fibrillation and flutter previously was also marked fatigue. To improve the chances of maintaining sinus rhythm, which she had done for quite a while over the past 4 years with flecainide 150 mg p.o. b.i.d., I have set him up for direct current cardioversion.  During the hospitalization, flecainide dose was inappropriately charted at 100 mg b.i.d., patient has been taking 150 mg b.i.d. hence the prescription was filled.  *I have discussed this case with Dr. Einar Gip and he personally examined the patient and participated in formulating the plan.*  CC Dr. Jilda Panda.  Signed electronically by Ricky Maine, FNP-C (05/19/2016 4:10 PM)

## 2016-05-30 ENCOUNTER — Ambulatory Visit (HOSPITAL_COMMUNITY): Admission: RE | Admit: 2016-05-30 | Payer: Medicare Other | Source: Ambulatory Visit | Admitting: Cardiology

## 2016-05-30 ENCOUNTER — Encounter (HOSPITAL_COMMUNITY): Admission: RE | Payer: Self-pay | Source: Ambulatory Visit

## 2016-05-30 DIAGNOSIS — I480 Paroxysmal atrial fibrillation: Secondary | ICD-10-CM | POA: Diagnosis not present

## 2016-05-30 SURGERY — CARDIOVERSION
Anesthesia: Monitor Anesthesia Care

## 2016-06-01 ENCOUNTER — Ambulatory Visit (INDEPENDENT_AMBULATORY_CARE_PROVIDER_SITE_OTHER): Payer: Medicare Other | Admitting: Neurology

## 2016-06-01 ENCOUNTER — Encounter: Payer: Self-pay | Admitting: Neurology

## 2016-06-01 VITALS — BP 120/72 | HR 58 | Resp 18 | Ht 70.0 in | Wt 304.0 lb

## 2016-06-01 DIAGNOSIS — I4892 Unspecified atrial flutter: Secondary | ICD-10-CM | POA: Diagnosis not present

## 2016-06-01 DIAGNOSIS — G4733 Obstructive sleep apnea (adult) (pediatric): Secondary | ICD-10-CM

## 2016-06-01 DIAGNOSIS — E669 Obesity, unspecified: Secondary | ICD-10-CM

## 2016-06-01 DIAGNOSIS — Z7901 Long term (current) use of anticoagulants: Secondary | ICD-10-CM | POA: Diagnosis not present

## 2016-06-01 DIAGNOSIS — I480 Paroxysmal atrial fibrillation: Secondary | ICD-10-CM

## 2016-06-01 NOTE — Patient Instructions (Signed)

## 2016-06-01 NOTE — Progress Notes (Signed)
SLEEP MEDICINE CLINIC   Provider:  Larey Burton, M D  Referring Provider: Jilda Panda, MD Primary Care Physician:  Ricky Panda, MD  Chief Complaint  Patient presents with  . Sleep Consult    Rm 11. Patient states that his last sleep study was about 7-8 years ago. Currently using CPAP. Patient has trouble staying asleep, snores, witnessed apnea, wakes up feeling tired, daytime fatigue, takes naps. DME: [facility]    HPI:  06-01-2016,  Ricky Burton is a 56 y.o. male , seen here as a referral  from Dr. Mellody Burton for  Re-establishing sleep care. I had seen [name] about 8 years ago for the last time and at the time had placed him on ASV BiPAP which he continues to use. In the meantime he did have several new health problems including paroxysmal atrial fibrillation with long-term use of anticoagulants, pain, hypertensive heart disease without heart failure, gout,  Still has superobesity, hypercholesterolemia, and he was admitted in late January until early February  2018 with sepsis. He developed again atrial flutter this time and the cardioversion did not take hold. He has now a higher dose of flecainide to take and states that this has controlled his heart rate. Ricky Burton also has been talking to his primary care physician about significant weight loss and received information about dietary and nutritional approaches to weight loss, and exercise regimen and the remaining surgical options. There is a new clinic with an [place] led by Dr. Leafy Burton and located within the [facility] [place] on  [address] that is specialized in  bariatric medicinal/  nonsurgical option. I would like to add that as the patient provided a data download for his current CPAP, he is actually using BiPAP setting of 17/12 cm water with easy breathing function has used it for 97% of the last 30 days and is considered highly.compliant, with the average user time of 7 hours at night and a residual AHI of only 2.3.  His  primary care physician provided a copy of his last sleep study, based on complaints of witnessed apneas, loud snoring, nocturia and dry mouth. 2008 his AHI was 80 and he was titrated to 14 cm water on CPAP but gained further weight, about 60 pounds in the following 6 years. By 2013 his pressure needs changed and his Epworth sleepiness score was endorsed at 18 points. He used BiPAP therapy with a residual AHI at 17 per hour. He was changed to a ASV BiPAP machine.  Chief complaint according to patient : feeling tired again, but is using BiPAP   Sleep habits are as follows: The patient usually goes to bed at midnight, and falls asleep promptly by using his BiPAP. He stays asleep for about 4-1/2 hours before he has his first bathroom break he just wakes up spontaneously. After that he has trouble to reinitiate initiate sleep. He starts to read when he wakes up early but that may arise at about 5:30. The reading has not helped him to find sleep. He has only one bathroom break at night and usually he is awake when he goes it is not the urge to urinate that wakes him out of sleep. He does not wake with headaches, palpitations, is not diaphoretic or nauseated.  He does not feel that he is completely rested nor restored but he struggles to find more sleep. His bedroom is cool, quiet and dark. He shares a bedroom with his wife. She has not noticed any  new apneas of breakthrough snoring while using his device. Has witnessed air leaks, sometimes loud noises. The water reservoir leaks and the door doesn't close. He may ned a new machine. He was initially placed on positive airway pressure with the addition of oxygen being bled into the machine, but in the meantime he has sometimes traveled without taking the oxygen concentrator or tank with him and felt not different. In the hospital he was not placed on oxygen either. This indicates that oxygen is probably not needed any longer.    Sleep medical history and family  sleep history:  Adopted, one daughter , who is healthy , 23 years old.   Social history: Married, one adult daughter, nonsmoker, nondrinker, caffeine use is limited on coffee a day, no soda and no  ice tea. 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   Review of Systems: Out of a complete 14 system review, the patient complains of only the following symptoms, and all other reviewed systems are negative.   Epworth score is endorsed at 12 points, fatigue severity score was endorsed at this tea points no evidence of major depression with a history of depression, the patient reports feeling that he doesn't get enough sleep, but he has sleep insomnia not by initiating sleep late but by not being able to sustain sleep.    Social History   Social History  . Marital status: Married    Spouse name: Ricky Burton  . Number of children: 2  . Years of education: HS   Occupational History  . Retired     Social History Main Topics  . Smoking status: Never Smoker  . Smokeless tobacco: Never Used  . Alcohol use No  . Drug use: No  . Sexual activity: Not on file   Other Topics Concern  . Not on file   Social History Narrative   Patient is married Garment/textile technologist) and lives at home with his wife.   Patient has two children.   Patient is right-handed.   Patient drinks one cup of coffee daily.          Family History  Problem Relation Age of Onset  . Alzheimer's disease Mother     Past Medical History:  Diagnosis Date  . A-fib ([place])   . Anxiety   . Heart disease   . High cholesterol    controlled  . Morbid obesity ([place])   . OSA (obstructive sleep apnea)     No past surgical history on file.  Current Outpatient Prescriptions  Medication Sig Dispense Refill  . ciprofloxacin (CIPRO) 500 MG tablet Take 1 tablet (500 mg total) by mouth 2 (two) times daily. X 10 days 20 tablet 0  . febuxostat (ULORIC) 40 MG tablet Take 40 mg by  mouth daily.    . flecainide (TAMBOCOR) 100 MG tablet Take 100 mg by mouth 2 (two) times daily.    Marland Kitchen ibuprofen (ADVIL,MOTRIN) 200 MG tablet Take 200 mg by mouth every 6 (six) hours as needed for fever.    Marland Kitchen PARoxetine (PAXIL) 10 MG tablet Take 5 mg by mouth daily.    . rosuvastatin (CRESTOR) 10 MG tablet Take 10 mg by mouth daily.    . valsartan (DIOVAN) 320 MG tablet Take 1 tablet (320 mg total) by mouth daily. HOLD until follow-up with your doctor    . verapamil (COVERA HS) 180 MG (CO) 24 hr tablet Take  180 mg by mouth daily.    Marland Kitchen warfarin (COUMADIN) 5 MG tablet Take 5 mg by mouth daily.     No current facility-administered medications for this visit.     Allergies as of 06/01/2016 - Review Complete 06/01/2016  Allergen Reaction Noted  . Advair diskus [fluticasone-salmeterol] Palpitations 05/07/2016    Vitals: BP 120/72   Pulse (!) 58   Resp 18   Ht 5\' 10"  (1.778 m)   Wt (!) 304 lb (137.9 kg)   BMI 43.62 kg/m  Last Weight:  Wt Readings from Last 1 Encounters:  06/01/16 (!) 304 lb (137.9 kg)   [id number] mass index is 43.62 kg/m.     Last Height:   Ht Readings from Last 1 Encounters:  06/01/16 5\' 10"  (1.778 m)    Physical exam:  General: The patient is awake, alert and appears not in acute distress. The patient is well groomed. Head: Normocephalic, atraumatic. Neck is supple. Mallampati 4  neck circumference: 20. Nasal airflow  Patent = mildly congested . Retrognathia is seen.  No use of retainers, braces or dentures. Cardiovascular:  irregular rate and rhythm - but slow , without  murmurs or carotid bruit, and without distended neck veins. Respiratory: Lungs are clear to auscultation. Skin:  Without evidence of edema, or rash Trunk: BMI is super obese . The patient's posture is stooped  Neurologic exam : The patient is awake and alert, oriented to place and time.    Cranial nerves: Pupils are equal and briskly reactive to light. Funduscopic exam without  evidence of  pallor or edema.  Extraocular movements  in vertical and horizontal planes intact and without nystagmus. Visual fields by finger perimetry are intact. Hearing to finger rub intact.   Facial sensation intact to fine touch.  Facial motor strength is symmetric and tongue and uvula move midline. Shoulder shrug was symmetrical.   Motor exam: Normal tone, muscle bulk and symmetric strength in all extremities. He had a left sided hemiparesis after his stroke.  Sensory:  Fine touch, pinprick and vibration were tested in all extremities.  Numbness in left hand , Coordination: Rapid alternating movements/Finger-to-nose maneuver  normal without evidence of ataxia, dysmetria or tremor.  Gait and station: Patient walks without assistive device and is able unassisted to climb up to the exam table. Strength within normal limits.  Stance is stable and normal.  Deep tendon reflexes: in the  upper and lower extremities are symmetric and intact.   The patient was advised of the nature of the diagnosed sleep disorder , the treatment options and risks for general a health and wellness arising from not treating the condition.  I spent more than 45 minutes of face to face time with the patient. Greater than 50% of time was spent in counseling and coordination of care. We have discussed the diagnosis and differential and I answered the patient's questions.     Assessment:  After physical and neurologic examination, review of laboratory studies,  Personal review of imaging studies, reports of other /same  Imaging studies ,  Results of polysomnography/ neurophysiology testing and pre-existing records as far as provided in visit., my assessment is   1) considering the resurgence of daytime sleepiness at the rather low residual AHI while using compliantly positive airway pressure, I think part of his problem is related to high air leaks. I do  think that we need to  find a better fitting interface.  Hi machine  Is broken,  too and he needs  a re-titration to allow a new BiPAP ASV to be issued.  He does have some facial hair which usually breaks and air seal. There are newer models out that were not available to him at his last visit, such aligned with memory foam instead of a gel pill and are lighter in weight and more comfortable to wear should he decide for full face mask. He does use a full face mask now and I will ask him to consider an air touch interface.   2) risik factors are superobesity, hx of stroke, atrial fib and flutter,   Plan:  Treatment plan and additional workup :  Retitration on BiPAP, refitting for FFM interface.  Dr Ricky Burton referral to weight management.  RV with me, MD , after sleep study.     Asencion Partridge Devian Bartolomei MD  06/01/2016   CC: Ricky Panda, Md [address] [place], [place] [address]

## 2016-06-01 NOTE — Addendum Note (Signed)
Addended by: Larey Seat on: 06/01/2016 12:49 PM   Modules accepted: Orders

## 2016-06-12 DIAGNOSIS — I480 Paroxysmal atrial fibrillation: Secondary | ICD-10-CM | POA: Diagnosis not present

## 2016-06-12 DIAGNOSIS — E662 Morbid (severe) obesity with alveolar hypoventilation: Secondary | ICD-10-CM | POA: Diagnosis not present

## 2016-06-12 DIAGNOSIS — R0602 Shortness of breath: Secondary | ICD-10-CM | POA: Diagnosis not present

## 2016-06-20 DIAGNOSIS — I119 Hypertensive heart disease without heart failure: Secondary | ICD-10-CM | POA: Diagnosis not present

## 2016-06-20 DIAGNOSIS — R05 Cough: Secondary | ICD-10-CM | POA: Diagnosis not present

## 2016-06-20 DIAGNOSIS — Z7901 Long term (current) use of anticoagulants: Secondary | ICD-10-CM | POA: Diagnosis not present

## 2016-06-20 DIAGNOSIS — I480 Paroxysmal atrial fibrillation: Secondary | ICD-10-CM | POA: Diagnosis not present

## 2016-06-29 ENCOUNTER — Encounter (INDEPENDENT_AMBULATORY_CARE_PROVIDER_SITE_OTHER): Payer: Medicare Other | Admitting: Family Medicine

## 2016-07-03 ENCOUNTER — Ambulatory Visit (INDEPENDENT_AMBULATORY_CARE_PROVIDER_SITE_OTHER): Payer: Medicare Other | Admitting: Neurology

## 2016-07-03 DIAGNOSIS — G4733 Obstructive sleep apnea (adult) (pediatric): Secondary | ICD-10-CM | POA: Diagnosis not present

## 2016-07-03 DIAGNOSIS — E669 Obesity, unspecified: Secondary | ICD-10-CM

## 2016-07-03 DIAGNOSIS — I4892 Unspecified atrial flutter: Secondary | ICD-10-CM

## 2016-07-03 DIAGNOSIS — I480 Paroxysmal atrial fibrillation: Secondary | ICD-10-CM

## 2016-07-05 NOTE — Procedures (Signed)
PATIENT'S NAME:  Ricky, Burton DOB:      [birth year]      MR#:    [id number]     DATE OF RECORDING: 07/03/2016 REFERRING M.D.:  Jilda Panda, MD Study Performed:   BiPAP Titration HISTORY: Ricky Burton is a 56 y.o. male , seen here as a referral  from Dr. Mellody Drown for  Re-establishing sleep care. I had seen him last about 8 years ago. I had placed him on ASV BiPAP after he failed CPAP therapy, which he continues to use. In the meantime he developed new health problems including paroxysmal atrial fibrillation with long-term use of anticoagulants, pain, hypertensive heart disease without heart failure, gout,  he is still  superobese, has still hypercholesterolemia, and he was admitted in late January until early February  2018 with sepsis.  He developed again atrial flutter during this time and cardioversion did not take hold. He uses flecainide to control his heart rate.  A-fib, Anxiety, Heart disease, High Cholesterol, Morbid Obesity, OSA. The patient endorsed the Epworth Sleepiness Scale at 12 points and the Fatigue Score at 50 points. PHQ9  at 5 points.   The patient's weight 304 pounds with a height of 70 (inches), resulting in a BMI of 43.6 kg/m2. The patient's neck circumference measured 20 inches.  CURRENT MEDICATIONS: Cipro, Uloric, Tambocor, Advil, Paxil, Crestor, Diovan, Covera, Coumadin.  PROCEDURE:  This is a multichannel digital polysomnogram utilizing the SomnoStar 11.2 system.  Electrodes and sensors were applied and monitored per AASM Specifications.   EEG, EOG, Chin and Limb EMG, were sampled at 200 Hz.  ECG, Snore and Nasal Pressure, Thermal Airflow, Respiratory Effort, CPAP Flow and Pressure, Oximetry was sampled at 50 Hz. Digital video and audio were recorded.      BiPAP was initiated at 8/4 cmH20 with heated humidity per AASM split night standards and pressure was advanced to 19/15 cmH20 because of hypopneas, apneas and desaturations.  The patient barely slept at any pressure  exceeding 13/9 cm water. He achieved best tolerance at 11/7 cm water pressure and slept with an AHI of 2.0/hr.   Lights Out was at 22:33 and Lights On at 04:48. Total recording time (TRT) was 376 minutes, with a total sleep time (TST) of 303.5 minutes. The patient's sleep latency was 24.5 minutes with 1 minutes of wake time after sleep onset. REM latency was 87 minutes.  The sleep efficiency was 80.7 %.    SLEEP ARCHITECTURE: WASO (Wake after sleep onset) was 54 minutes.  There were 27 minutes in Stage N1, 169 minutes Stage N2, 40.5 minutes Stage N3 and 67 minutes in Stage REM.  The percentage of Stage N1 was 8.9%, Stage N2 was 55.7%, Stage N3 was 13.3% and Stage R (REM sleep) was 22.1%. The sleep architecture was notable for frequent arousals.  RESPIRATORY ANALYSIS:  There were 26 respiratory events: 2 obstructive apneas, 0 central apneas and 24 hypopneas with 0 respiratory event related arousals (RERAs).      The total APNEA/HYPOPNEA INDEX  (AHI) was 5.1 /hour and the total RESPIRATORY DISTURBANCE INDEX was 5.1/ hour  7 events occurred in REM sleep and 19 events in NREM. The REM AHI was 6.3 /hour versus a non-REM AHI of 4.8 /hour.  The patient spent 303 minutes of total sleep time in the supine position and 1 minute in non-supine.   OXYGEN SATURATION & C02:  The baseline 02 saturation was 94%, with the lowest being 83%. Time spent below 89% saturation equaled 7 minutes.  PERIODIC LIMB MOVEMENTS:  The patient had a total of 0 Periodic Limb Movements. The arousals were noted as: 46 were spontaneous, 0 were associated with PLMs, and 7 were associated with respiratory events. Audio and video analysis did not show any abnormal or unusual movements, behaviors, phonations or vocalizations.  The patient did not take any bathroom breaks. Snoring was noted.  DIAGNOSIS This patient with a history of treatment emergent central apnea  under CPAP did tolerate BiPAP well. His best tolerated and most effective  pressure was 11/7 cm water in non supine position. The technologist used a FFM, Simplus, by [employer] in large size.    PLANS/RECOMMENDATIONS: BiPAP will be ordered with a setting of 11/7 cm water and with a SIMPLUS large FFM.  The patient has been referred for medical weight management.   A follow up appointment in 60-90 days post PAP therapy begin will be scheduled in the Sleep Clinic at [facility]. The patient is kindly asked to bring the machine and equipment to the appointment.   Please call our sleep team at [place] [phone] ) with any questions.      I certify that I have reviewed the entire raw data recording prior to the issuance of this report in accordance with the Standards of Accreditation of the American Academy of Sleep Medicine (AASM)   Larey Seat, M.D.  07-05-2016  Diplomat, American Board of Psychiatry and Neurology  Diplomat, [place] of Sleep Medicine Medical Director, [place] [facility] at [facility]  Dr Adrian Prows

## 2016-07-05 NOTE — Addendum Note (Signed)
Addended by: Larey Seat on: 07/05/2016 05:33 PM   Modules accepted: Orders

## 2016-07-06 ENCOUNTER — Telehealth: Payer: Self-pay

## 2016-07-06 NOTE — Telephone Encounter (Signed)
-----   Message from Larey Seat, MD sent at 07/05/2016  5:33 PM EDT ----- DIAGNOSIS This patient with a history of treatment emergent central apnea  under CPAP did tolerate BiPAP well. His best tolerated and most effective pressure was 11/7 cm water in non supine position. The technologist used a FFM, Simplus, by [employer] in large size.    PLANS/RECOMMENDATIONS: BiPAP will be ordered with a setting of 11/7 cm water and with a SIMPLUS large FFM.  The patient has been referred for medical weight management.   A follow up appointment in 60-90 days post PAP therapy begin will be scheduled in the Sleep Clinic at [facility]. The patient is kindly asked to bring the machine and equipment to the appointment.   Please call our sleep team at [place] [phone] ) with any questions.      I certify that I have reviewed the entire raw data recording prior to the issuance of this report in accordance with the Standards of Accreditation of the [place] [employer] of [employer] Medicine (AASM)   Larey Seat, M.D.  07-05-2016

## 2016-07-06 NOTE — Telephone Encounter (Signed)
I called pt. I advised him that his bipap titration study was successful and that Dr. Brett Fairy recommends that he start a bipap at home. Pt is agreeable to this. I advised him that I will send this order to a DME, [facility], and they will call him to get the bipap set up within about one week. I reviewed bipap compliance expectations with the pt. A follow up appt was made for 10/16/2016 at 11:00am. Pt verbalized understanding of results. Pt had no questions at this time but was encouraged to call back if questions arise.

## 2016-07-11 DIAGNOSIS — J301 Allergic rhinitis due to pollen: Secondary | ICD-10-CM | POA: Diagnosis not present

## 2016-07-11 DIAGNOSIS — I480 Paroxysmal atrial fibrillation: Secondary | ICD-10-CM | POA: Diagnosis not present

## 2016-07-11 DIAGNOSIS — Z7901 Long term (current) use of anticoagulants: Secondary | ICD-10-CM | POA: Diagnosis not present

## 2016-07-11 DIAGNOSIS — R05 Cough: Secondary | ICD-10-CM | POA: Diagnosis not present

## 2016-08-08 DIAGNOSIS — Z7901 Long term (current) use of anticoagulants: Secondary | ICD-10-CM | POA: Diagnosis not present

## 2016-08-08 DIAGNOSIS — I480 Paroxysmal atrial fibrillation: Secondary | ICD-10-CM | POA: Diagnosis not present

## 2016-08-09 DIAGNOSIS — H04123 Dry eye syndrome of bilateral lacrimal glands: Secondary | ICD-10-CM | POA: Diagnosis not present

## 2016-09-11 DIAGNOSIS — I480 Paroxysmal atrial fibrillation: Secondary | ICD-10-CM | POA: Diagnosis not present

## 2016-09-11 DIAGNOSIS — Z7901 Long term (current) use of anticoagulants: Secondary | ICD-10-CM | POA: Diagnosis not present

## 2016-09-28 ENCOUNTER — Telehealth: Payer: Self-pay | Admitting: Neurology

## 2016-09-28 NOTE — Telephone Encounter (Signed)
I had an extended conversation with this pt's wife, Marliss Coots, per DPR. Pt's wife reports that pt has been on nocturnal oxygen at night for a long time. Pt's wife decided that pt needed to turn in his oxygen to [facility] a couple weeks ago. Pt's wife now wants him to restart it and wants to use [facility] and wants one of our doctors here to prescribe it for pt in Dr. Edwena Felty absence. Pt's wife is insistent that he cannot wait until the 10/16/16 appt with Dr. Brett Fairy. Pt is more lethargic after stopping oxygen. I explained to pt's wife several times that she needs to speak with the original prescriber of the oxygen but she does not know who that is. I explained that further testing may be necessary to prescribe O2 for the pt but pt's wife is also insistent that the bipap titration performed in March of 2018 should suffice for this and that she needs an order for O2 sent to [facility] right away. I asked her to call [facility] and see if they will transfer the order they were using to [facility] but she says that [facility] will not do that because they are mad pt wants to switch DMEs. I advised her that I still recommend that pt follow up on 10/16/16 with Dr. Brett Fairy and she can discuss additional testing for nocturnal O2 with him then, but pt's wife does not think this is soon enough and wants something done now. I offered to the pt's wife to send this request to Shirlean Mylar in the sleep lab to review the titration and see it pt even qualifies for oxygen based on the bipap titration. Pt's wife would not answer me when I asked her if the pt was using his bipap nightly. She says that she only wants to discuss his oxygen, not his bipap. Will send to [place] and Dr. Rexene Alberts as an [place] in Dr. Edwena Felty absence for any recommendations to help this pt.

## 2016-09-28 NOTE — Telephone Encounter (Signed)
Pt's wife said he d/c oxygen last week but she thinks he needs to start it again. She doesn't know who prescribed it for him since being in [place] for the past 12 years. He is wanting to use [facility] and they need RX, office notes, results of testing. She is aware Dr Brett Fairy is out of the office until next week and this can wait until then

## 2016-09-28 NOTE — Telephone Encounter (Signed)
Spoke with patients wife and explained that I reviewed the technical data of sleep study and at optimal pressure his oxygen levels stayed up in the 90's. No oxygen was added because of this. He does not need 02 if wearing his bipap machine. She understood and felt better about this. She will be at the follow-up visit with Dr. Brett Fairy.

## 2016-10-02 ENCOUNTER — Ambulatory Visit: Payer: Medicare Other | Attending: Internal Medicine | Admitting: Physical Therapy

## 2016-10-02 ENCOUNTER — Encounter: Payer: Self-pay | Admitting: Physical Therapy

## 2016-10-02 DIAGNOSIS — R293 Abnormal posture: Secondary | ICD-10-CM | POA: Insufficient documentation

## 2016-10-02 DIAGNOSIS — M542 Cervicalgia: Secondary | ICD-10-CM | POA: Insufficient documentation

## 2016-10-02 DIAGNOSIS — M62838 Other muscle spasm: Secondary | ICD-10-CM | POA: Diagnosis not present

## 2016-10-02 NOTE — Therapy (Signed)
[place], [place], [address] Phone: [phone]   Fax:  [phone]  Physical Therapy Evaluation  Patient Details  Name: Ricky Burton MRN: [id number] Date of Birth: 25-Mar-1961 Referring Provider: Jilda Panda MD  Encounter Date: 10/02/2016      PT End of Session - 10/02/16 0926    Visit Number 1   Number of Visits 13   Date for PT Re-Evaluation 11/13/16   Authorization Type MCR: KX mod by 15th visit, progress note by 10th visit.    PT Start Time 0847   PT Stop Time 0934   PT Time Calculation (min) 47 min   Activity Tolerance Patient tolerated treatment well   Behavior During Therapy [facility] for tasks assessed/performed      Past Medical History:  Diagnosis Date  . A-fib ([place])   . Anxiety   . Heart disease   . High cholesterol    controlled  . Hypertension   . Morbid obesity ([place])   . OSA (obstructive sleep apnea)   . Stroke [facility])    L sided in 2001    Past Surgical History:  Procedure Laterality Date  . HERNIA REPAIR  1986    There were no vitals filed for this visit.       Subjective Assessment - 10/02/16 0855    Subjective pt is a 56 y.o M with CC of neck pain that started 4-5 months ago with no specific mechanism. reports tingling in the L upper shoulder that is noted with turning the head to the L.  No previous PMHx of L shoulder problems beside the stroke in 2001.  denies HA, tinnits, dizziness.    Pertinent History hx or L sided stroke (2001)   Limitations Other (comment)  Looking to the L, Laying down   How long can you sit comfortably? unlimited   How long can you stand comfortably? unlimited   How long can you walk comfortably? unlimited   Diagnostic tests N/A   Patient Stated Goals figure out whats wrong, decrease, improve safety with driving,    Currently in Pain? Yes   Pain Score 0-No pain  at worst 7-8/10   Pain Location Neck   Pain Orientation Left   Pain  Descriptors / Indicators Numbness;Tightness   Pain Type Chronic pain   Pain Onset More than a month ago   Pain Frequency Intermittent   Aggravating Factors  looking to the L, laying down,    Pain Relieving Factors getting out of position            [facility] PT Assessment - 10/02/16 0849      Assessment   Medical Diagnosis Neck pain with R numbness at shoulder   Referring Provider Jilda Panda MD   Onset Date/Surgical Date --  4-5 months ago   Hand Dominance Right   Next MD Visit --  monthly   Prior Therapy yes     Precautions   Precautions None     Restrictions   Weight Bearing Restrictions No     Balance Screen   Has the patient fallen in the past 6 months No   Has the patient had a decrease in activity level because of a fear of falling?  No   Is the patient reluctant to leave their home because of a fear of falling?  No     Home Social worker Private residence   Living Arrangements Spouse/significant other   Available Help  at Discharge Available PRN/intermittently;Family   Type of [place] Access Stairs to enter   Entrance Stairs-Number of Steps 6   Entrance Stairs-Rails Can reach both   Home Layout Two level   Alternate Level Stairs-Number of Steps 13   Alternate Level Stairs-Rails Right     Prior Function   Level of Independence Independent;Independent with basic ADLs   Vocation Retired   Leisure Marketing executive, exercise, playing dogs, reading, saxophone     Cognition   Overall Cognitive Status Within Functional Limits for tasks assessed     Observation/Other Assessments   Focus on Therapeutic Outcomes (FOTO)  36% limited  predicted 28% limited     Posture/Postural Control   Posture/Postural Control Postural limitations   Postural Limitations Rounded Shoulders;Forward head     ROM / Strength   AROM / PROM / Strength AROM     AROM   AROM Assessment Site Cervical   Cervical Flexion 50  cracking/ pulling at end range    Cervical Extension 30  pulling during motion   Cervical - Right Side Bend 28   Cervical - Left Side Bend 28   Cervical - Right Rotation 58   Cervical - Left Rotation 61  pulling at end range     Palpation   Palpation comment TTP along upper trap/ levator scapulae, scalenes on the L and sub-occipitals, tenderness over the 1st/ 2nd costovertebral facet     Special Tests    Special Tests Cervical   Cervical Tests other;Dictraction;Spurling's     Spurling's   Findings Negative   Side Left     Distraction Test   Findngs Negative     other    Comment ULTT  median nerve on L            Objective measurements completed on examination: See above findings.          [place] Adult PT Treatment/Exercise - 10/02/16 0849      Manual Therapy   Manual therapy comments manual trigger point release over the L upper trap/ L scalenes x3                PT Education - 10/02/16 1015    Education provided Yes   Education Details evaluation findings, POC, goals, HEP with form/ rational, anatomy of area involved.    Person(s) Educated Patient   Methods Explanation;Verbal cues;Handout   Comprehension Verbalized understanding;Verbal cues required          PT Short Term Goals - 10/02/16 1027      PT SHORT TERM GOAL #1   Title pt to be I with inital HEP (10/23/2016)   Time 3   Period Weeks   Status New     PT SHORT TERM GOAL #2   Title pt will be able to verbalize / demo proper posture and lifting and carrying mechanics to prevent and reduce neck pain (10/23/2016)   Time 3   Period Weeks   Status New     PT SHORT TERM GOAL #3   Title pt to reduce spasm in the L scalene/ l upper trap to reduce pain to </= 4/10 with prolonged sitting and reduce radiating symptoms (10/23/2016)   Time 3   Period Weeks   Status New           PT Long Term Goals - 10/02/16 1031      PT LONG TERM GOAL #1   Title pt will be able to sit for >/=  60 min with </= 1/10 pain in the neck and  no referral of pain for improvement in QOL (11/13/2016)   Time 6   Period Weeks   Status New     PT LONG TERM GOAL #2   Title pt will increase cervical mobility bil >/= 10 degree with flexion/ extension and 6 degrees with bil side bending/ rotation with </= 1/10 pain / pulling for safety during driving (0/06/5595)   Time 6   Period Weeks   Status New     PT LONG TERM GOAL #3   Title increase FOTO score to </= 28% limited to demo improvement in function (11/13/2016)   Time 6   Period Weeks   Status New     PT LONG TERM GOAL #4   Title pt to be I with all HEP given as of last visit (11/13/2016)   Time 6   Period Weeks   Status New                Plan - 10/02/16 1016    Clinical Impression Statement Mr. Daywalt presents to OPPT with CC of neck pain that started 4-5 months ago insidiously with reported as pain and numbess. Cervical mobility is WNL with pain noted with R rotation/ L side bending.  TTP along upper trap/ levator scapulae, scalenes on the L and sub-occipitals, tenderness over the 1st/ 2nd costovertebral facet on the L. following manual trigger point release of the L scalene he reported significant relief of pain. He would benefit from physical therapy to decrease pain, reduce muscle tightness/ pulling, improve functional posture, and maximize is his function by addressing the deficits.    Clinical Presentation Stable   Clinical Decision Making Low   Rehab Potential Good   PT Frequency 2x / week   PT Duration 6 weeks   PT Treatment/Interventions ADLs/Self Care Home Management;Electrical Stimulation;Iontophoresis 4mg /ml Dexamethasone;Cryotherapy;Moist Heat;Ultrasound;Dry needling;Taping;Manual techniques;Therapeutic activities;Therapeutic exercise;Passive range of motion;Patient/family education   PT Next Visit Plan assess/ review HEP, manual for scalenes/ upper trap/ levator, rib mobs, thoracic mobility, posture eduation, scap stab, modalities PRN   PT Home Exercise Plan  upper trap stretch, levator scap stretch, chin tuck, book opening,    Consulted and Agree with Plan of Care Patient      Patient will benefit from skilled therapeutic intervention in order to improve the following deficits and impairments:  Increased fascial restricitons, Pain, Postural dysfunction, Improper body mechanics, Decreased endurance  Visit Diagnosis: Cervicalgia - Plan: PT plan of care cert/re-cert  Other muscle spasm - Plan: PT plan of care cert/re-cert  Abnormal posture - Plan: PT plan of care cert/re-cert      G-Codes - 41/63/84 1043    Functional Assessment Tool Used (Outpatient Only) FOTO/ clinical judgement   Functional Limitation Changing and maintaining body position   Changing and Maintaining Body Position Current Status (T3646) At least 20 percent but less than 40 percent impaired, limited or restricted   Changing and Maintaining Body Position Goal Status (O0321) At least 1 percent but less than 20 percent impaired, limited or restricted       Problem List Patient Active Problem List   Diagnosis Date Noted  . Kidney disease 05/08/2016  . Hypertension 05/07/2016  . Hypotension 05/07/2016  . Sepsis ([place]) 05/07/2016  . Depression 05/07/2016  . Morbid obesity with BMI of 50.0-59.9, adult ([place]) 11/07/2012  . MORBID OBESITY 06/01/2010  . Atrial fibrillation ([place]) 06/01/2010  . OSA (obstructive sleep apnea) 06/01/2010    Jasman Pfeifle  Ericka Pontiff, DPT, LAT, ATC  10/02/16  10:49 AM       [facility] [address] [place], [place], [address] Phone: [phone]   Fax:  [phone]  Name: JERONIMO HELLBERG MRN: [id number] Date of Birth: 31-May-1960

## 2016-10-10 ENCOUNTER — Ambulatory Visit: Payer: Medicare Other | Attending: Internal Medicine | Admitting: Physical Therapy

## 2016-10-10 ENCOUNTER — Encounter: Payer: Self-pay | Admitting: Physical Therapy

## 2016-10-10 DIAGNOSIS — R293 Abnormal posture: Secondary | ICD-10-CM | POA: Insufficient documentation

## 2016-10-10 DIAGNOSIS — M542 Cervicalgia: Secondary | ICD-10-CM | POA: Insufficient documentation

## 2016-10-10 DIAGNOSIS — M62838 Other muscle spasm: Secondary | ICD-10-CM | POA: Insufficient documentation

## 2016-10-10 NOTE — Therapy (Signed)
[place], [place], [address] Phone: [phone]   Fax:  [phone]  Physical Therapy Treatment  Patient Details  Name: Ricky Burton MRN: [id number] Date of Birth: [birth year] Referring Provider: Jilda Panda MD  Encounter Date: 10/10/2016      PT End of Session - 10/10/16 0835    Visit Number 2   Number of Visits 13   Date for PT Re-Evaluation 11/13/16   Authorization Type MCR: KX mod by 15th visit, progress note by 10th visit.    PT Start Time 0801   PT Stop Time 0853   PT Time Calculation (min) 52 min   Activity Tolerance Patient tolerated treatment well   Behavior During Therapy [facility] for tasks assessed/performed      Past Medical History:  Diagnosis Date  . A-fib ([place])   . Anxiety   . Heart disease   . High cholesterol    controlled  . Hypertension   . Morbid obesity ([place])   . OSA (obstructive sleep apnea)   . Stroke [facility])    L sided in 2001    Past Surgical History:  Procedure Laterality Date  . HERNIA REPAIR  1986    There were no vitals filed for this visit.      Subjective Assessment - 10/10/16 0801    Subjective "The first day after last session the vibration felt less, I did some of the exercises"    Currently in Pain? Yes   Pain Location Neck   Pain Orientation Left   Pain Descriptors / Indicators Numbness;Tightness   Pain Type Chronic pain   Pain Onset More than a month ago   Pain Frequency Intermittent   Aggravating Factors  laying down   Pain Relieving Factors getting out of the positing, using more pillow.                          [place] Adult PT Treatment/Exercise - 10/10/16 0831      Neck Exercises: Seated   Neck Retraction 10 reps;5 secs   Other Seated Exercise thoracic rotation 2 x 10 with arms crossed     Modalities   Modalities Moist Heat     Moist Heat Therapy   Number Minutes Moist Heat 10 Minutes   Moist Heat Location Cervical      Manual Therapy   Manual Therapy Joint mobilization;Myofascial release;Soft tissue mobilization;Taping   Joint Mobilization T1 -T6 PA mobs grade 3, L 1st rib mobs grade 3   Soft tissue mobilization IASTm over L upper trap / scalene   Myofascial Release rolling over the L scalene   [place] L upper trap inhibition taping  trial          Trigger Point Dry Needling - 10/10/16 0842    Consent Given? Yes   Education Handout Provided Yes   Muscles Treated Upper Body Upper trapezius  scalenes on L   Upper Trapezius Response Twitch reponse elicited;Palpable increased muscle length              PT Education - 10/10/16 0834    Education provided Yes   Education Details anatomy of the muscles and referral patterns. what TPDN is, benefits/ what to expect and after care. benefits of inhibition taping and length of wear  before/ during treatment   Person(s) Educated Patient   Methods Explanation;Verbal cues;Handout   Comprehension Verbalized understanding;Verbal cues required  PT Short Term Goals - 10/02/16 1027      PT SHORT TERM GOAL #1   Title pt to be I with inital HEP (10/23/2016)   Time 3   Period Weeks   Status New     PT SHORT TERM GOAL #2   Title pt will be able to verbalize / demo proper posture and lifting and carrying mechanics to prevent and reduce neck pain (10/23/2016)   Time 3   Period Weeks   Status New     PT SHORT TERM GOAL #3   Title pt to reduce spasm in the L scalene/ l upper trap to reduce pain to </= 4/10 with prolonged sitting and reduce radiating symptoms (10/23/2016)   Time 3   Period Weeks   Status New           PT Long Term Goals - 10/02/16 1031      PT LONG TERM GOAL #1   Title pt will be able to sit for >/= 60 min with </= 1/10 pain in the neck and no referral of pain for improvement in QOL (11/13/2016)   Time 6   Period Weeks   Status New     PT LONG TERM GOAL #2   Title pt will increase cervical mobility bil >/= 10  degree with flexion/ extension and 6 degrees with bil side bending/ rotation with </= 1/10 pain / pulling for safety during driving (11/09/4233)   Time 6   Period Weeks   Status New     PT LONG TERM GOAL #3   Title increase FOTO score to </= 28% limited to demo improvement in function (11/13/2016)   Time 6   Period Weeks   Status New     PT LONG TERM GOAL #4   Title pt to be I with all HEP given as of last visit (11/13/2016)   Time 6   Period Weeks   Status New               Plan - 10/10/16 3614    Clinical Impression Statement pt reports improvement in the "vibration" sensation since the last session. educated and performed DN on the L upper trap/ scalene. perofrmed soft tissue work and throacic mobiliity mobs/ exercise. trialed inhibition taping which he reported decreased pain and tightness post session.    PT Next Visit Plan assess response to DN/ taping, update HEP PRN, manual for scalenes/ upper trap/ levator, rib mobs, thoracic mobility, posture eduation, scap stab, modalities PRN   PT Home Exercise Plan upper trap stretch, levator scap stretch, chin tuck, book opening,    Consulted and Agree with Plan of Care Patient      Patient will benefit from skilled therapeutic intervention in order to improve the following deficits and impairments:  Increased fascial restricitons, Pain, Postural dysfunction, Improper body mechanics, Decreased endurance  Visit Diagnosis: Cervicalgia  Other muscle spasm  Abnormal posture     Problem List Patient Active Problem List   Diagnosis Date Noted  . Kidney disease 05/08/2016  . Hypertension 05/07/2016  . Hypotension 05/07/2016  . Sepsis ([place]) 05/07/2016  . Depression 05/07/2016  . Morbid obesity with BMI of 50.0-59.9, adult ([place]) 11/07/2012  . MORBID OBESITY 06/01/2010  . Atrial fibrillation ([place]) 06/01/2010  . OSA (obstructive sleep apnea) 06/01/2010   Ricky Lake PT, DPT, LAT, ATC  10/10/16  8:45 AM      [facility]  Health Outpatient Rehabilitation [facility] [address] [place], [place], [address]  Phone: [phone]   Fax:  [phone]  Name: Ricky Burton MRN: [id number] Date of Birth: Jan 08, 1961

## 2016-10-16 ENCOUNTER — Ambulatory Visit: Payer: Self-pay | Admitting: Neurology

## 2016-10-17 ENCOUNTER — Ambulatory Visit: Payer: Medicare Other | Admitting: Physical Therapy

## 2016-10-19 ENCOUNTER — Encounter: Payer: Self-pay | Admitting: Physical Therapy

## 2016-10-19 ENCOUNTER — Ambulatory Visit: Payer: Medicare Other | Admitting: Physical Therapy

## 2016-10-19 DIAGNOSIS — M542 Cervicalgia: Secondary | ICD-10-CM

## 2016-10-19 DIAGNOSIS — R293 Abnormal posture: Secondary | ICD-10-CM | POA: Diagnosis not present

## 2016-10-19 DIAGNOSIS — M62838 Other muscle spasm: Secondary | ICD-10-CM

## 2016-10-19 NOTE — Therapy (Signed)
[place], [place], [address] Phone: [phone]   Fax:  [phone]  Physical Therapy Treatment  Patient Details  Name: BENJIMEN KELLEY MRN: [id number] Date of Birth: 04/10/1961 Referring Provider: Jilda Panda MD  Encounter Date: 10/19/2016      PT End of Session - 10/19/16 0828    Visit Number 3   Number of Visits 13   Date for PT Re-Evaluation 11/13/16   Authorization Type MCR: KX mod by 15th visit, progress note by 10th visit.    PT Start Time 0803   PT Stop Time 0846   PT Time Calculation (min) 43 min   Activity Tolerance Patient tolerated treatment well   Behavior During Therapy [facility] for tasks assessed/performed      Past Medical History:  Diagnosis Date  . A-fib ([place])   . Anxiety   . Heart disease   . High cholesterol    controlled  . Hypertension   . Morbid obesity ([place])   . OSA (obstructive sleep apnea)   . Stroke [facility])    L sided in 2001    Past Surgical History:  Procedure Laterality Date  . HERNIA REPAIR  1986    There were no vitals filed for this visit.      Subjective Assessment - 10/19/16 0804    Subjective "It has gotten a little better, and I am sleeping better"    Currently in Pain? No/denies   Pain Onset More than a month ago   Aggravating Factors  occasionally turning to the R, and laying down wrong   Pain Relieving Factors getting out of the position.                          [place] Adult PT Treatment/Exercise - 10/19/16 0816      Neck Exercises: Standing   Other Standing Exercises lower trap activation 2 x 10 elbows on the wall with yellow band   Other Standing Exercises Horizontal abduction 2 x 10      Shoulder Exercises: ROM/Strengthening   UBE (Upper Arm Bike) L1 5 min   changing direction 2:30      Manual Therapy   Manual therapy comments manual trigger point release over the L upper trap/ L scalenes x3  how to perform at home.    Joint Mobilization 1st rib mobs grade 3on the L   educated on how to peform at home     Neck Exercises: Stretches   Upper Trapezius Stretch 2 reps;30 seconds   Levator Stretch 2 reps;30 seconds                PT Education - 10/19/16 0813    Education provided Yes   Education Details benefits of manual trigger point release, rib mobs to the 1st rib. discussed benefits of gym exercise but taking it slow to avoid injury and focus on form first then progress with adding light weight   Person(s) Educated Patient   Methods Explanation;Verbal cues;Handout   Comprehension Verbalized understanding;Verbal cues required          PT Short Term Goals - 10/19/16 0847      PT SHORT TERM GOAL #1   Title pt to be I with inital HEP (10/23/2016)   Time 3   Period Weeks   Status Achieved     PT SHORT TERM GOAL #2   Title pt will be able to verbalize / demo proper posture  and lifting and carrying mechanics to prevent and reduce neck pain (10/23/2016)   Time 3   Period Weeks   Status Partially Met     PT SHORT TERM GOAL #3   Title pt to reduce spasm in the L scalene/ l upper trap to reduce pain to </= 4/10 with prolonged sitting and reduce radiating symptoms (10/23/2016)   Time 3   Period Weeks   Status Partially Met           PT Long Term Goals - 10/02/16 1031      PT LONG TERM GOAL #1   Title pt will be able to sit for >/= 60 min with </= 1/10 pain in the neck and no referral of pain for improvement in QOL (11/13/2016)   Time 6   Period Weeks   Status New     PT LONG TERM GOAL #2   Title pt will increase cervical mobility bil >/= 10 degree with flexion/ extension and 6 degrees with bil side bending/ rotation with </= 1/10 pain / pulling for safety during driving (08/16/6823)   Time 6   Period Weeks   Status New     PT LONG TERM GOAL #3   Title increase FOTO score to </= 28% limited to demo improvement in function (11/13/2016)   Time 6   Period Weeks   Status New     PT  LONG TERM GOAL #4   Title pt to be I with all HEP given as of last visit (11/13/2016)   Time 6   Period Weeks   Status New               Plan - 10/19/16 0845    Clinical Impression Statement pt reports improvement since the last session. opted to hold off on DN and utlized MTPR so it can be performed at home for the scalened and upper trap. continued manual for 1st rib mobs which was added to HEP. pt perofrmed scap stability exercise which he fatigue quickly but performed well. post session he reported no pain or N/T and declined modalities.    PT Next Visit Plan update HEP PRN, manual for scalenes/ upper trap/ levator, rib mobs, thoracic mobility, posture eduation, scap stab, modalities PRN, update for scapular stability   PT Home Exercise Plan upper trap stretch, levator scap stretch, chin tuck, book opening, 1st rib mobs   Consulted and Agree with Plan of Care Patient      Patient will benefit from skilled therapeutic intervention in order to improve the following deficits and impairments:     Visit Diagnosis: Cervicalgia  Other muscle spasm  Abnormal posture     Problem List Patient Active Problem List   Diagnosis Date Noted  . Kidney disease 05/08/2016  . Hypertension 05/07/2016  . Hypotension 05/07/2016  . Sepsis ([place]) 05/07/2016  . Depression 05/07/2016  . Morbid obesity with BMI of 50.0-59.9, adult ([place]) 11/07/2012  . MORBID OBESITY 06/01/2010  . Atrial fibrillation ([place]) 06/01/2010  . OSA (obstructive sleep apnea) 06/01/2010   Starr Lake PT, DPT, LAT, ATC  10/19/16  8:48 AM      [facility] Outpatient Rehabilitation [facility] [address] [place], [place], [address] Phone: [phone]   Fax:  [phone]  Name: Arnold Line ANTOLIN MRN: [id number] Date of Birth: Jun 10, 1960

## 2016-10-24 ENCOUNTER — Encounter: Payer: Self-pay | Admitting: Physical Therapy

## 2016-10-24 ENCOUNTER — Ambulatory Visit: Payer: Medicare Other | Admitting: Physical Therapy

## 2016-10-24 DIAGNOSIS — M62838 Other muscle spasm: Secondary | ICD-10-CM

## 2016-10-24 DIAGNOSIS — R293 Abnormal posture: Secondary | ICD-10-CM

## 2016-10-24 DIAGNOSIS — M542 Cervicalgia: Secondary | ICD-10-CM | POA: Diagnosis not present

## 2016-10-24 NOTE — Therapy (Addendum)
[place], [place], [address] Phone: [phone]   Fax:  [phone]  Physical Therapy Treatment / Discharge Summary  Patient Details  Name: Ricky Burton MRN: [id number] Date of Birth: 10-Jul-1960 Referring Provider: Jilda Panda MD  Encounter Date: 10/24/2016      PT End of Session - 10/24/16 0821    Visit Number 4   Number of Visits 13   Date for PT Re-Evaluation 11/13/16   Authorization Type MCR: KX mod by 15th visit, progress note by 10th visit.    PT Start Time 0801   PT Stop Time 0844   PT Time Calculation (min) 43 min   Activity Tolerance Patient tolerated treatment well   Behavior During Therapy [facility] for tasks assessed/performed      Past Medical History:  Diagnosis Date  . A-fib ([place])   . Anxiety   . Heart disease   . High cholesterol    controlled  . Hypertension   . Morbid obesity ([place])   . OSA (obstructive sleep apnea)   . Stroke [facility])    L sided in 2001    Past Surgical History:  Procedure Laterality Date  . HERNIA REPAIR  1986    There were no vitals filed for this visit.      Subjective Assessment - 10/24/16 0800    Subjective "I am feeling alittle sore all over today but I think it is due to just general gym exercise"   Currently in Pain? No/denies   Pain Score 0-No pain   Pain Location Neck   Pain Orientation Left   Pain Onset More than a month ago   Pain Frequency Occasional                         [facility] Adult PT Treatment/Exercise - 10/24/16 0816      Neck Exercises: Seated   Shoulder ABduction Both;15 reps  horizontal with red theraband   Other Seated Exercise SNAGS 2 x 10  demonstration/ tactile cues updated for HEP     Neck Exercises: Supine   Upper Extremity D2 10 reps;Theraband  2 sets bil    Theraband Level (UE D2) Level 1 (Yellow)     Shoulder Exercises: Prone   Other Prone Exercises I's, T's Ys 2 x 10 ea.      Shoulder  Exercises: ROM/Strengthening   UBE (Upper Arm Bike) L1 71mn   changing at 3 min,     Manual Therapy   Joint Mobilization clavicle mobs grade 3 inferior/ posterior, L cervical gapping grade 3     Neck Exercises: Stretches   Upper Trapezius Stretch 2 reps;30 seconds   Levator Stretch 2 reps;30 seconds                PT Education - 10/24/16 0820    Education provided Yes   Education Details educated anatomy of the cervical spine, updated HEP for cervical SNAGs   Person(s) Educated Patient   Methods Explanation;Verbal cues;Handout   Comprehension Verbalized understanding;Verbal cues required          PT Short Term Goals - 10/19/16 0847      PT SHORT TERM GOAL #1   Title pt to be I with inital HEP (10/23/2016)   Time 3   Period Weeks   Status Achieved     PT SHORT TERM GOAL #2   Title pt will be able to verbalize / demo proper posture and  lifting and carrying mechanics to prevent and reduce neck pain (10/23/2016)   Time 3   Period Weeks   Status Partially Met     PT SHORT TERM GOAL #3   Title pt to reduce spasm in the L scalene/ l upper trap to reduce pain to </= 4/10 with prolonged sitting and reduce radiating symptoms (10/23/2016)   Time 3   Period Weeks   Status Partially Met           PT Long Term Goals - 10/02/16 1031      PT LONG TERM GOAL #1   Title pt will be able to sit for >/= 60 min with </= 1/10 pain in the neck and no referral of pain for improvement in QOL (11/13/2016)   Time 6   Period Weeks   Status New     PT LONG TERM GOAL #2   Title pt will increase cervical mobility bil >/= 10 degree with flexion/ extension and 6 degrees with bil side bending/ rotation with </= 1/10 pain / pulling for safety during driving (09/11/6801)   Time 6   Period Weeks   Status New     PT LONG TERM GOAL #3   Title increase FOTO score to </= 28% limited to demo improvement in function (11/13/2016)   Time 6   Period Weeks   Status New     PT LONG TERM GOAL #4    Title pt to be I with all HEP given as of last visit (11/13/2016)   Time 6   Period Weeks   Status New        G-code Rationale FOTO/ Clinical judgement functional limiation: changing and maintaining body position Goal status CI discharge status SI        Plan - 10/24/16 0837    Clinical Impression Statement Mr. Sinn states the N/T has improved and is only occasionally. continued mobs for cervical spine and updated HEP for SNAGs with pt reported improvement. conintued scapular stability exericses in sitting/ and prone which he performed well with minimal cues for form and rationale.    PT Treatment/Interventions ADLs/Self Care Home Management;Electrical Stimulation;Iontophoresis 81m/ml Dexamethasone;Cryotherapy;Moist Heat;Ultrasound;Dry needling;Taping;Manual techniques;Therapeutic activities;Therapeutic exercise;Passive range of motion;Patient/family education   PT Next Visit Plan update HEP PRN, manual for scalenes/ upper trap/ levator, rib mobs, thoracic mobility, posture eduation, scap stab, modalities PRN, update for scapular stability   PT Home Exercise Plan upper trap stretch, levator scap stretch, chin tuck, book opening, 1st rib mobs, SNAGS,    Consulted and Agree with Plan of Care Patient      Patient will benefit from skilled therapeutic intervention in order to improve the following deficits and impairments:  Increased fascial restricitons, Pain, Postural dysfunction, Improper body mechanics, Decreased endurance  Visit Diagnosis: Cervicalgia  Other muscle spasm  Abnormal posture     Problem List Patient Active Problem List   Diagnosis Date Noted  . Kidney disease 05/08/2016  . Hypertension 05/07/2016  . Hypotension 05/07/2016  . Sepsis ([place] 05/07/2016  . Depression 05/07/2016  . Morbid obesity with BMI of 50.0-59.9, adult ([place] 11/07/2012  . MORBID OBESITY 06/01/2010  . Atrial fibrillation ([place] 06/01/2010  . OSA (obstructive sleep apnea) 06/01/2010    KStarr LakePT, DPT, LAT, ATC  10/24/16  8:44 AM      [facility]GPro[address]Al[place] [address]Phone: [phone]  Fax:  [phone] Name: Ricky FLAMMERMRN: [id number] of Birth: [birth year]  PHYSICAL THERAPY DISCHARGE SUMMARY  Visits from Start of Care: 4  Current functional level related to goals / functional outcomes: See goals   Remaining deficits: unknown   Education / Equipment: HEP, theraband, anatomy of area involved  Plan: Patient agrees to discharge.  Patient goals were partially met. Patient is being discharged due to not returning since the last visit.  ?????    Vlasta Baskin PT, DPT, LAT, ATC  11/23/16  11:07 AM

## 2016-10-26 ENCOUNTER — Ambulatory Visit: Payer: Medicare Other | Admitting: Physical Therapy

## 2016-10-27 ENCOUNTER — Telehealth: Payer: Self-pay | Admitting: Physical Therapy

## 2016-10-27 NOTE — Telephone Encounter (Signed)
Spoke to patient's wife about no-show yesterday. She  was unsure if patient knew he had an appointment yesterday. She will remind him of his appointments next week.

## 2016-10-31 ENCOUNTER — Ambulatory Visit: Payer: Medicare Other | Admitting: Physical Therapy

## 2016-11-02 ENCOUNTER — Ambulatory Visit: Payer: Medicare Other | Admitting: Physical Therapy

## 2016-12-18 DIAGNOSIS — Z8673 Personal history of transient ischemic attack (TIA), and cerebral infarction without residual deficits: Secondary | ICD-10-CM | POA: Diagnosis not present

## 2016-12-18 DIAGNOSIS — I480 Paroxysmal atrial fibrillation: Secondary | ICD-10-CM | POA: Diagnosis not present

## 2016-12-18 DIAGNOSIS — Z6841 Body Mass Index (BMI) 40.0 and over, adult: Secondary | ICD-10-CM | POA: Diagnosis not present

## 2016-12-18 DIAGNOSIS — I119 Hypertensive heart disease without heart failure: Secondary | ICD-10-CM | POA: Diagnosis not present

## 2016-12-18 DIAGNOSIS — G4733 Obstructive sleep apnea (adult) (pediatric): Secondary | ICD-10-CM | POA: Diagnosis not present

## 2017-01-23 DIAGNOSIS — G4733 Obstructive sleep apnea (adult) (pediatric): Secondary | ICD-10-CM | POA: Diagnosis not present

## 2017-01-23 DIAGNOSIS — I480 Paroxysmal atrial fibrillation: Secondary | ICD-10-CM | POA: Diagnosis not present

## 2017-01-23 DIAGNOSIS — R05 Cough: Secondary | ICD-10-CM | POA: Diagnosis not present

## 2017-01-23 DIAGNOSIS — I119 Hypertensive heart disease without heart failure: Secondary | ICD-10-CM | POA: Diagnosis not present

## 2017-02-07 DIAGNOSIS — I480 Paroxysmal atrial fibrillation: Secondary | ICD-10-CM | POA: Diagnosis not present

## 2017-02-07 DIAGNOSIS — I10 Essential (primary) hypertension: Secondary | ICD-10-CM | POA: Diagnosis not present

## 2017-02-07 DIAGNOSIS — E662 Morbid (severe) obesity with alveolar hypoventilation: Secondary | ICD-10-CM | POA: Diagnosis not present

## 2017-02-07 DIAGNOSIS — R0602 Shortness of breath: Secondary | ICD-10-CM | POA: Diagnosis not present

## 2017-02-20 DIAGNOSIS — Z23 Encounter for immunization: Secondary | ICD-10-CM | POA: Diagnosis not present

## 2017-02-20 DIAGNOSIS — Z7901 Long term (current) use of anticoagulants: Secondary | ICD-10-CM | POA: Diagnosis not present

## 2017-02-20 DIAGNOSIS — I119 Hypertensive heart disease without heart failure: Secondary | ICD-10-CM | POA: Diagnosis not present

## 2017-02-20 DIAGNOSIS — R05 Cough: Secondary | ICD-10-CM | POA: Diagnosis not present

## 2017-03-21 DIAGNOSIS — J0100 Acute maxillary sinusitis, unspecified: Secondary | ICD-10-CM | POA: Diagnosis not present

## 2017-04-05 DIAGNOSIS — E559 Vitamin D deficiency, unspecified: Secondary | ICD-10-CM | POA: Diagnosis not present

## 2017-04-05 DIAGNOSIS — Z7901 Long term (current) use of anticoagulants: Secondary | ICD-10-CM | POA: Diagnosis not present

## 2017-04-05 DIAGNOSIS — M109 Gout, unspecified: Secondary | ICD-10-CM | POA: Diagnosis not present

## 2017-04-05 DIAGNOSIS — G4733 Obstructive sleep apnea (adult) (pediatric): Secondary | ICD-10-CM | POA: Diagnosis not present

## 2017-04-05 DIAGNOSIS — Z0001 Encounter for general adult medical examination with abnormal findings: Secondary | ICD-10-CM | POA: Diagnosis not present

## 2017-04-05 DIAGNOSIS — Z8673 Personal history of transient ischemic attack (TIA), and cerebral infarction without residual deficits: Secondary | ICD-10-CM | POA: Diagnosis not present

## 2017-04-05 DIAGNOSIS — E7800 Pure hypercholesterolemia, unspecified: Secondary | ICD-10-CM | POA: Diagnosis not present

## 2017-04-05 DIAGNOSIS — I119 Hypertensive heart disease without heart failure: Secondary | ICD-10-CM | POA: Diagnosis not present

## 2017-04-05 DIAGNOSIS — Z6841 Body Mass Index (BMI) 40.0 and over, adult: Secondary | ICD-10-CM | POA: Diagnosis not present

## 2017-05-04 DIAGNOSIS — I480 Paroxysmal atrial fibrillation: Secondary | ICD-10-CM | POA: Diagnosis not present

## 2017-05-04 DIAGNOSIS — E559 Vitamin D deficiency, unspecified: Secondary | ICD-10-CM | POA: Diagnosis not present

## 2017-07-12 DIAGNOSIS — G4733 Obstructive sleep apnea (adult) (pediatric): Secondary | ICD-10-CM | POA: Diagnosis not present

## 2017-07-12 DIAGNOSIS — Z7901 Long term (current) use of anticoagulants: Secondary | ICD-10-CM | POA: Diagnosis not present

## 2017-07-12 DIAGNOSIS — I119 Hypertensive heart disease without heart failure: Secondary | ICD-10-CM | POA: Diagnosis not present

## 2017-07-12 DIAGNOSIS — E559 Vitamin D deficiency, unspecified: Secondary | ICD-10-CM | POA: Diagnosis not present

## 2017-07-12 DIAGNOSIS — M109 Gout, unspecified: Secondary | ICD-10-CM | POA: Diagnosis not present

## 2017-07-12 DIAGNOSIS — E7800 Pure hypercholesterolemia, unspecified: Secondary | ICD-10-CM | POA: Diagnosis not present

## 2017-07-20 ENCOUNTER — Other Ambulatory Visit: Payer: Self-pay | Admitting: Surgery

## 2017-07-20 ENCOUNTER — Other Ambulatory Visit (HOSPITAL_COMMUNITY): Payer: Self-pay | Admitting: Surgery

## 2017-07-26 ENCOUNTER — Other Ambulatory Visit: Payer: Self-pay | Admitting: Surgery

## 2017-08-08 DIAGNOSIS — E662 Morbid (severe) obesity with alveolar hypoventilation: Secondary | ICD-10-CM | POA: Diagnosis not present

## 2017-08-08 DIAGNOSIS — I10 Essential (primary) hypertension: Secondary | ICD-10-CM | POA: Diagnosis not present

## 2017-08-08 DIAGNOSIS — R0609 Other forms of dyspnea: Secondary | ICD-10-CM | POA: Diagnosis not present

## 2017-08-08 DIAGNOSIS — I480 Paroxysmal atrial fibrillation: Secondary | ICD-10-CM | POA: Diagnosis not present

## 2017-08-15 DIAGNOSIS — Z7901 Long term (current) use of anticoagulants: Secondary | ICD-10-CM | POA: Diagnosis not present

## 2017-08-15 DIAGNOSIS — N183 Chronic kidney disease, stage 3 (moderate): Secondary | ICD-10-CM | POA: Diagnosis not present

## 2017-08-20 ENCOUNTER — Other Ambulatory Visit: Payer: Self-pay | Admitting: Internal Medicine

## 2017-08-20 DIAGNOSIS — I10 Essential (primary) hypertension: Secondary | ICD-10-CM

## 2017-08-20 DIAGNOSIS — N183 Chronic kidney disease, stage 3 unspecified: Secondary | ICD-10-CM

## 2017-08-21 ENCOUNTER — Ambulatory Visit
Admission: RE | Admit: 2017-08-21 | Discharge: 2017-08-21 | Disposition: A | Discharge status: Home / Self Care | Payer: Medicare Other | Source: Ambulatory Visit | Attending: Internal Medicine | Admitting: Internal Medicine

## 2017-08-21 DIAGNOSIS — I10 Essential (primary) hypertension: Secondary | ICD-10-CM

## 2017-08-21 DIAGNOSIS — N183 Chronic kidney disease, stage 3 unspecified: Secondary | ICD-10-CM

## 2017-08-21 DIAGNOSIS — I129 Hypertensive chronic kidney disease with stage 1 through stage 4 chronic kidney disease, or unspecified chronic kidney disease: Secondary | ICD-10-CM | POA: Diagnosis not present

## 2017-08-31 IMAGING — US US ABDOMEN COMPLETE
1 series · 14 of 25 positions shown · non-contrast
Comparison: 03/28/2012.

CLINICAL DATA: Bacteremia

EXAM:
ABDOMEN ULTRASOUND COMPLETE

[Series 1: us abdomen complete · 0.30mm/px · 14 of 78 slices shown]
[im 1/78]
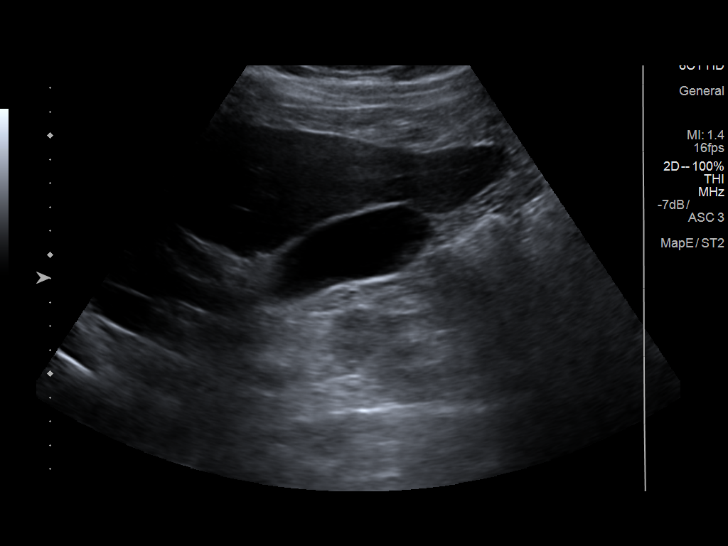
[im 7/78]
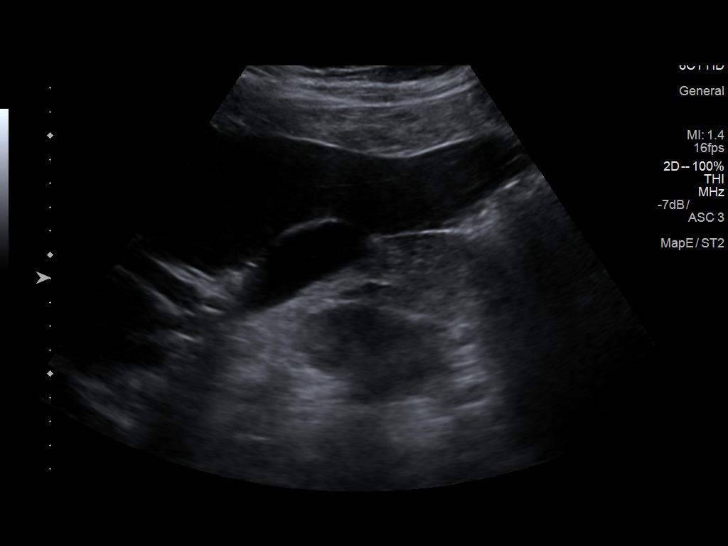
[im 13/78]
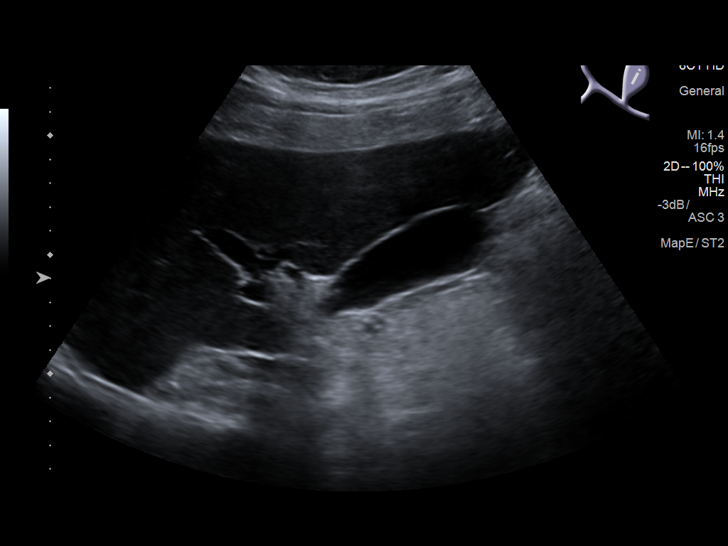
[im 20/78]
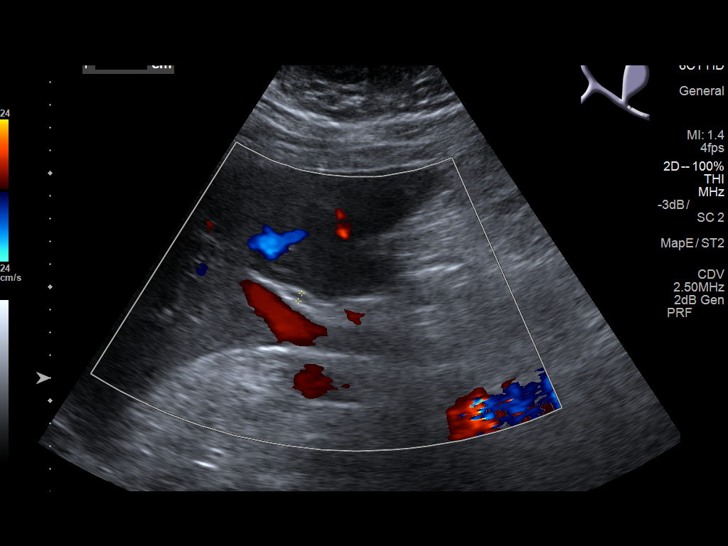
[im 26/78]
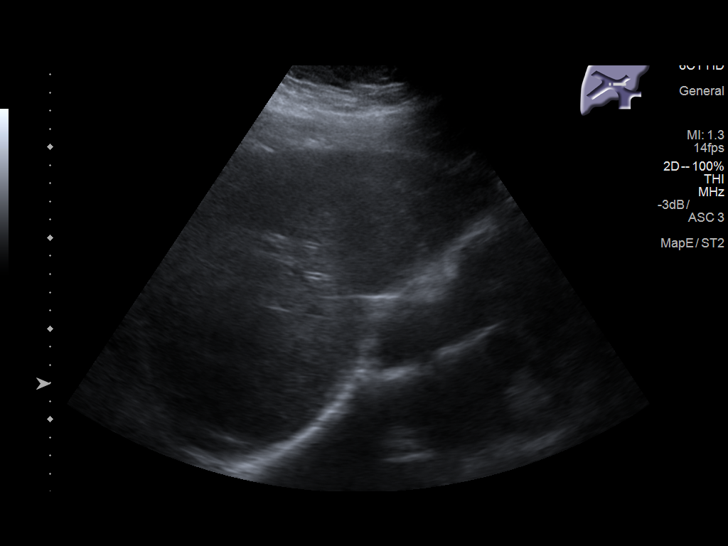
[im 29/78]
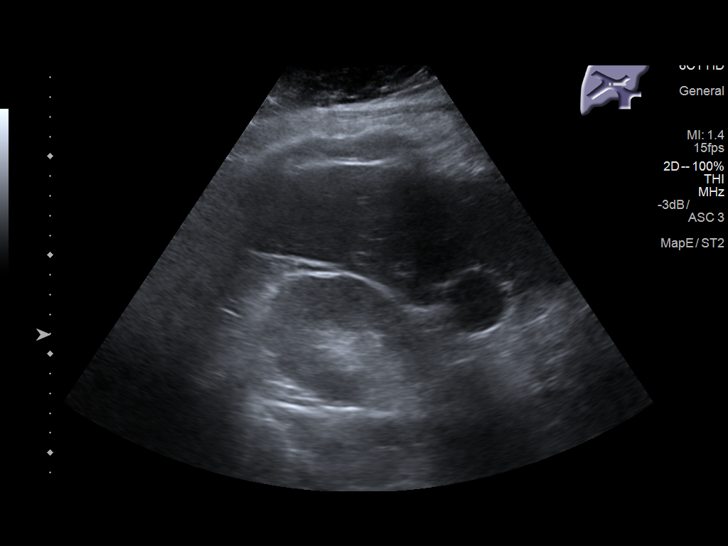
[im 36/78]
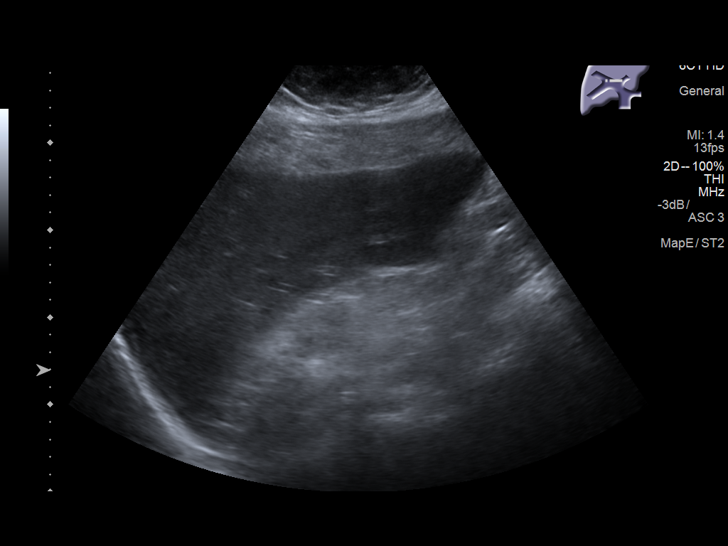
[im 42/78]
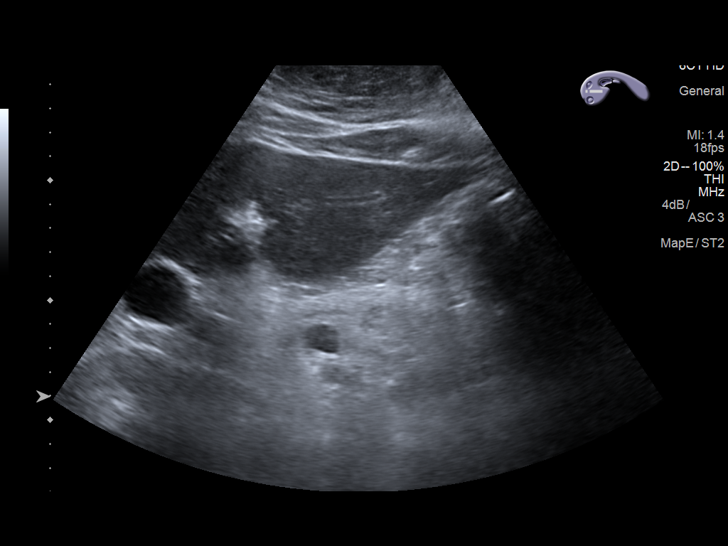
[im 49/78]
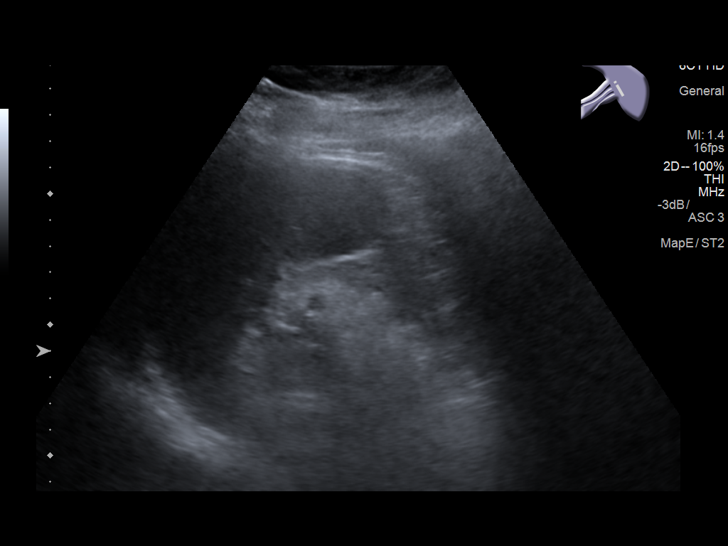
[im 52/78]
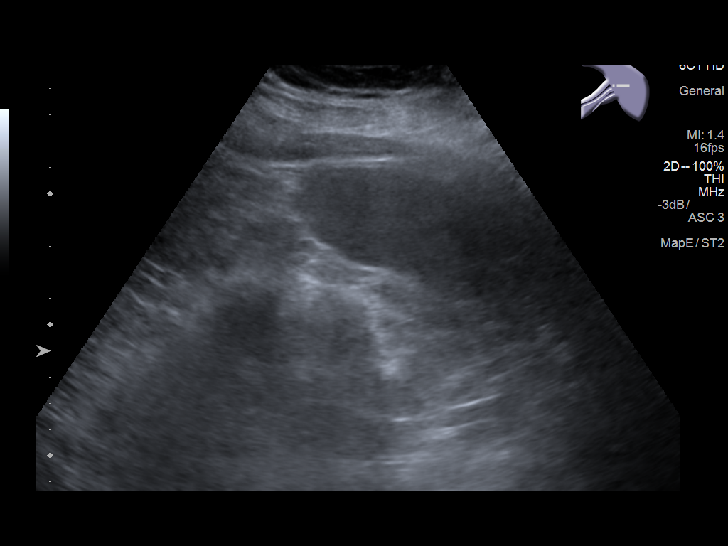
[im 58/78]
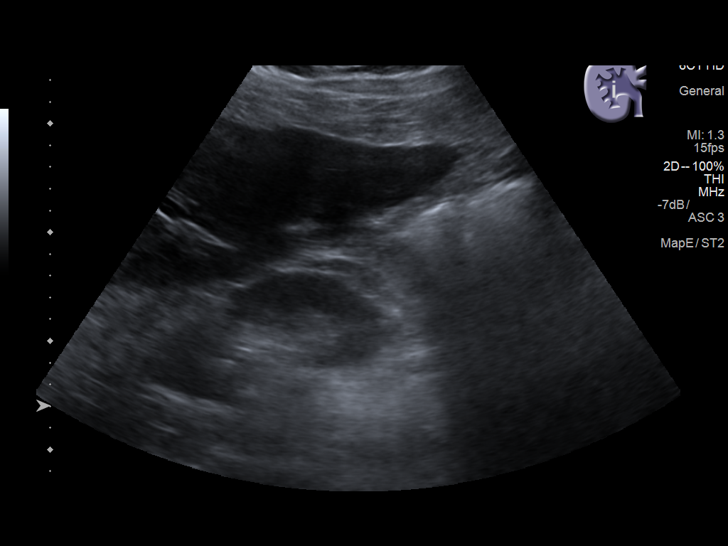
[im 65/78]
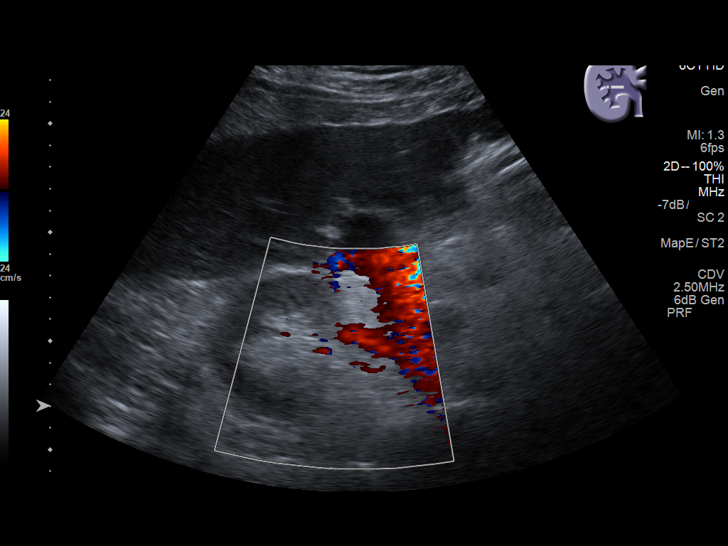
[im 71/78]
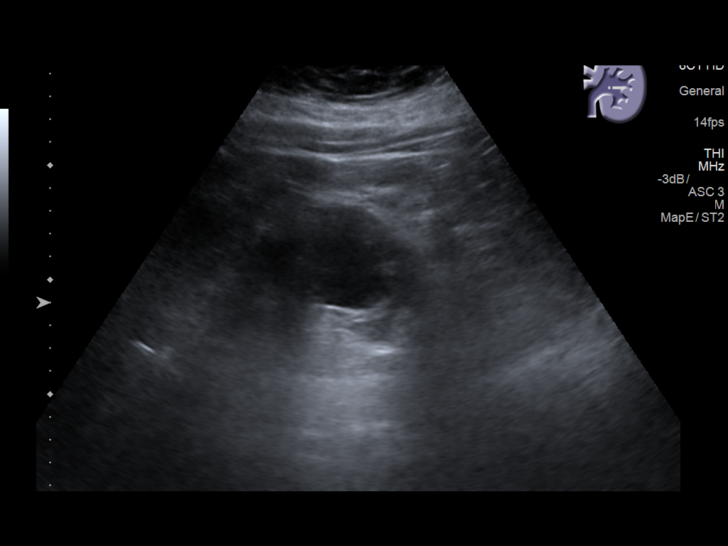
[im 78/78]
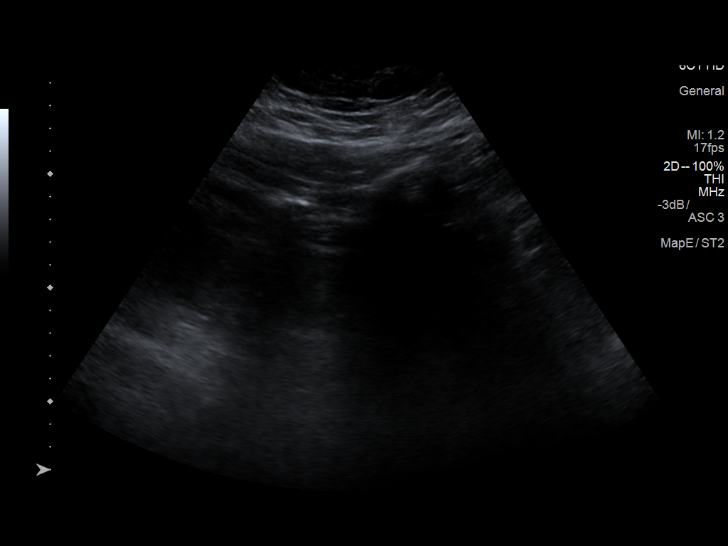

[14 of 25 positions shown; findings below may reference images not displayed]

FINDINGS: Gallbladder: No gallstones or wall thickening visualized. No
sonographic Murphy sign noted by sonographer.

Common bile duct: Diameter: 3.8 mm.

Liver: No focal lesion identified. Within normal limits in
parenchymal echogenicity.

IVC: No abnormality visualized.

Pancreas: Visualized portion unremarkable.

Spleen: Size and appearance within normal limits.

Right Kidney: Length: 11.9 cm.. There is a 2 cm hypoechoic structure
identified adjacent to the lower pole of the right kidney. This
corresponds to a cystic lesion seen on prior exam.

Left Kidney: Length: 12.9 cm.. Large somewhat bilobed 8.4 cm cystic
lesion similar to that seen on prior exam.

Abdominal aorta: No aneurysm visualized.

Other findings: None.
IMPRESSION: Bilateral renal cystic lesions stable from 8179.

No other focal abnormality is noted.

## 2017-09-13 DIAGNOSIS — Z7901 Long term (current) use of anticoagulants: Secondary | ICD-10-CM | POA: Diagnosis not present

## 2017-09-13 DIAGNOSIS — N183 Chronic kidney disease, stage 3 (moderate): Secondary | ICD-10-CM | POA: Diagnosis not present

## 2017-09-27 ENCOUNTER — Ambulatory Visit: Payer: Medicare Other | Admitting: Skilled Nursing Facility1

## 2017-10-18 DIAGNOSIS — M109 Gout, unspecified: Secondary | ICD-10-CM | POA: Diagnosis not present

## 2017-10-18 DIAGNOSIS — G4733 Obstructive sleep apnea (adult) (pediatric): Secondary | ICD-10-CM | POA: Diagnosis not present

## 2017-10-18 DIAGNOSIS — I119 Hypertensive heart disease without heart failure: Secondary | ICD-10-CM | POA: Diagnosis not present

## 2017-10-18 DIAGNOSIS — Z7901 Long term (current) use of anticoagulants: Secondary | ICD-10-CM | POA: Diagnosis not present

## 2017-10-18 DIAGNOSIS — N183 Chronic kidney disease, stage 3 (moderate): Secondary | ICD-10-CM | POA: Diagnosis not present

## 2017-10-18 DIAGNOSIS — E559 Vitamin D deficiency, unspecified: Secondary | ICD-10-CM | POA: Diagnosis not present

## 2017-10-18 DIAGNOSIS — I480 Paroxysmal atrial fibrillation: Secondary | ICD-10-CM | POA: Diagnosis not present

## 2017-10-18 DIAGNOSIS — Z8673 Personal history of transient ischemic attack (TIA), and cerebral infarction without residual deficits: Secondary | ICD-10-CM | POA: Diagnosis not present

## 2017-11-19 DIAGNOSIS — Z7901 Long term (current) use of anticoagulants: Secondary | ICD-10-CM | POA: Diagnosis not present

## 2017-11-19 DIAGNOSIS — I480 Paroxysmal atrial fibrillation: Secondary | ICD-10-CM | POA: Diagnosis not present

## 2017-12-19 DIAGNOSIS — I480 Paroxysmal atrial fibrillation: Secondary | ICD-10-CM | POA: Diagnosis not present

## 2017-12-19 DIAGNOSIS — Z7901 Long term (current) use of anticoagulants: Secondary | ICD-10-CM | POA: Diagnosis not present

## 2018-01-02 DIAGNOSIS — Z23 Encounter for immunization: Secondary | ICD-10-CM | POA: Diagnosis not present

## 2018-02-04 DIAGNOSIS — Z6830 Body mass index (BMI) 30.0-30.9, adult: Secondary | ICD-10-CM | POA: Diagnosis not present

## 2018-02-04 DIAGNOSIS — E7800 Pure hypercholesterolemia, unspecified: Secondary | ICD-10-CM | POA: Diagnosis not present

## 2018-02-04 DIAGNOSIS — I119 Hypertensive heart disease without heart failure: Secondary | ICD-10-CM | POA: Diagnosis not present

## 2018-02-04 DIAGNOSIS — Z7901 Long term (current) use of anticoagulants: Secondary | ICD-10-CM | POA: Diagnosis not present

## 2018-02-04 DIAGNOSIS — F419 Anxiety disorder, unspecified: Secondary | ICD-10-CM | POA: Diagnosis not present

## 2018-02-04 DIAGNOSIS — Z6841 Body Mass Index (BMI) 40.0 and over, adult: Secondary | ICD-10-CM | POA: Diagnosis not present

## 2018-02-04 DIAGNOSIS — Z8673 Personal history of transient ischemic attack (TIA), and cerebral infarction without residual deficits: Secondary | ICD-10-CM | POA: Diagnosis not present

## 2018-02-18 DIAGNOSIS — M79662 Pain in left lower leg: Secondary | ICD-10-CM | POA: Diagnosis not present

## 2018-02-18 DIAGNOSIS — I480 Paroxysmal atrial fibrillation: Secondary | ICD-10-CM | POA: Diagnosis not present

## 2018-02-18 DIAGNOSIS — Z7901 Long term (current) use of anticoagulants: Secondary | ICD-10-CM | POA: Diagnosis not present

## 2018-08-12 ENCOUNTER — Ambulatory Visit: Payer: Self-pay | Admitting: Cardiology

## 2018-11-18 ENCOUNTER — Telehealth: Payer: Self-pay

## 2018-11-19 ENCOUNTER — Other Ambulatory Visit: Payer: Self-pay

## 2018-11-19 ENCOUNTER — Ambulatory Visit (INDEPENDENT_AMBULATORY_CARE_PROVIDER_SITE_OTHER): Payer: Medicare Other | Admitting: Cardiology

## 2018-11-19 ENCOUNTER — Encounter: Payer: Self-pay | Admitting: Cardiology

## 2018-11-19 VITALS — BP 152/103 | HR 56 | Ht 70.0 in | Wt 290.5 lb

## 2018-11-19 DIAGNOSIS — I480 Paroxysmal atrial fibrillation: Secondary | ICD-10-CM

## 2018-11-19 DIAGNOSIS — N183 Chronic kidney disease, stage 3 unspecified: Secondary | ICD-10-CM

## 2018-11-19 DIAGNOSIS — I129 Hypertensive chronic kidney disease with stage 1 through stage 4 chronic kidney disease, or unspecified chronic kidney disease: Secondary | ICD-10-CM

## 2018-11-19 DIAGNOSIS — Z6841 Body Mass Index (BMI) 40.0 and over, adult: Secondary | ICD-10-CM

## 2018-11-19 DIAGNOSIS — I10 Essential (primary) hypertension: Secondary | ICD-10-CM

## 2018-11-19 DIAGNOSIS — R9431 Abnormal electrocardiogram [ECG] [EKG]: Secondary | ICD-10-CM | POA: Diagnosis not present

## 2018-11-19 DIAGNOSIS — R0609 Other forms of dyspnea: Secondary | ICD-10-CM

## 2018-11-19 DIAGNOSIS — R0789 Other chest pain: Secondary | ICD-10-CM

## 2018-11-19 MED ORDER — HYDRALAZINE HCL 25 MG PO TABS: 25.0000 mg | ORAL_TABLET | Freq: Three times a day (TID) | ORAL | 2 refills | Status: DC

## 2018-11-19 NOTE — Patient Instructions (Signed)
Hold Verapamil and flecainide on the day of the stress test (exercise nuclear.

## 2018-11-19 NOTE — Progress Notes (Signed)
Primary Physician/Referring:  Jilda Panda, MD  Patient ID: Ricky Burton, male    DOB: 07/22/1960, 58 y.o.   MRN: [id number]  Chief Complaint  Patient presents with  . Chest Pain  . Hypertension  . Follow-up   HPI:    HPI: Ricky Burton  is a 58 y.o. AA male with history of hypertension with CKD stage III, hyperlipidemia, morbid obesity with obstructive sleep apnea on BiPAP, and history of CVA in 2001. Also has history of atrial flutter status post ablation in 2003 in [place], Paroxysmal A. Fib and on Coumadin. This is managed by his PCP.   Patient called and made an appointment to see her as as he was having episodes of mild palpitations, increased fatigue mild dyspnea and also might chest discomfort.  Chest pain described as tightness, notices after he has exercised but easily relieved within a few minutes.  No PND or orthopnea.  No hemoptysis.  No leg edema.  No recent travel.  Otherwise she is tolerating all his medications well.  No bleeding diathesis on warfarin which is being managed by his PCP.  Past Medical History:  Diagnosis Date  . A-fib ([place])   . Anxiety   . Heart disease   . High cholesterol    controlled  . Hypertension   . Morbid obesity ([place])   . OSA (obstructive sleep apnea)   . Stroke [facility])    L sided in 2001   Past Surgical History:  Procedure Laterality Date  . HERNIA REPAIR  1986   Social History   Socioeconomic History  . Marital status: Married    Spouse name: Marliss Coots  . Number of children: 1  . Years of education: HS  . Highest education level: Not on file  Occupational History  . Occupation: Retired   Scientific laboratory technician  . Financial resource strain: Not on file  . Food insecurity    Worry: Not on file    Inability: Not on file  . Transportation needs    Medical: Not on file    Non-medical: Not on file  Tobacco Use  . Smoking status: Never Smoker  . Smokeless tobacco: Never Used  Substance and Sexual Activity  . Alcohol use: No   . Drug use: No  . Sexual activity: Not on file  Lifestyle  . Physical activity    Days per week: Not on file    Minutes per session: Not on file  . Stress: Not on file  Relationships  . Social Herbalist on phone: Not on file    Gets together: Not on file    Attends religious service: Not on file    Active member of club or organization: Not on file    Attends meetings of clubs or organizations: Not on file    Relationship status: Not on file  . Intimate partner violence    Fear of current or ex partner: Not on file    Emotionally abused: Not on file    Physically abused: Not on file    Forced sexual activity: Not on file  Other Topics Concern  . Not on file  Social History Narrative   Patient is married Garment/textile technologist) and lives at home with his wife.   Patient has two children.   Patient is right-handed.   Patient drinks one cup of coffee daily.      ROS  Review of Systems  Constitution: Positive for malaise/fatigue. Negative for chills, decreased appetite and  weight gain.  Cardiovascular: Positive for chest pain and dyspnea on exertion. Negative for leg swelling and syncope.  Respiratory: Positive for snoring (on BiPAP ).   Endocrine: Negative for cold intolerance.  Hematologic/Lymphatic: Does not bruise/bleed easily.  Musculoskeletal: Negative for joint swelling.  Gastrointestinal: Negative for abdominal pain, anorexia, change in bowel habit, hematochezia and melena.  Neurological: Negative for headaches and light-headedness.  Psychiatric/Behavioral: Negative for depression and substance abuse.  All other systems reviewed and are negative.  Objective  Blood pressure (!) 154/100, pulse (!) 57, height 5' 10"  (1.778 m), weight 290 lb 8 oz (131.8 kg), SpO2 95 %. Body mass index is 41.68 kg/m.   Physical Exam  Constitutional: He appears well-developed. No distress.  Morbidly obese  HENT:  Head: Atraumatic.  Eyes: Conjunctivae are normal.  Neck: Neck supple. No  thyromegaly present.  Short neck and difficult to evaluate JVP  Cardiovascular: Normal rate, regular rhythm and normal heart sounds. Exam reveals no gallop.  No murmur heard. Pulses:      Carotid pulses are 2+ on the right side and 2+ on the left side.      Dorsalis pedis pulses are 2+ on the right side and 2+ on the left side.       Posterior tibial pulses are 2+ on the right side and 2+ on the left side.  Femoral and popliteal pulse difficult to feel due to patient's body habitus.   Pulmonary/Chest: Effort normal and breath sounds normal.  Abdominal: Soft. Bowel sounds are normal.  Obese. Pannus present  Musculoskeletal: Normal range of motion.        General: No edema.  Neurological: He is alert.  Skin: Skin is warm and dry.  Psychiatric: He has a normal mood and affect.   Radiology: No results found.  Laboratory examination:   07/12/2017: Potassium 4.8, creatinine 1.9, CMP otherwise normal.  Cholesterol 94, triglycerides 55, HDL 42, LDL 41. Vitamin D 31.3.  06/20/2016: CBC normal. Potassium 4.4, entire CMP not available for review.  05/24/2016: Creatinine 1.5, EGFR 57, sodium 133, potassium 4.6, BMP normal. CMP Latest Ref Rng & Units 05/10/2016 05/10/2016 05/09/2016  Glucose 65 - 99 mg/dL 97 100(H) 118(H)  BUN 6 - 20 mg/dL 16 15 16   Creatinine 0.61 - 1.24 mg/dL 1.70(H) 1.77(H) 1.78(H)  Sodium 135 - 145 mmol/L 137 138 140  Potassium 3.5 - 5.1 mmol/L 3.9 4.0 4.1  Chloride 101 - 111 mmol/L 108 109 107  CO2 22 - 32 mmol/L 21(L) 22 24  Calcium 8.9 - 10.3 mg/dL 8.2(L) 8.2(L) 8.7(L)  Total Protein 6.5 - 8.1 g/dL 6.4(L) - -  Total Bilirubin 0.3 - 1.2 mg/dL 1.5(H) - -  Alkaline Phos 38 - 126 U/L 74 - -  AST 15 - 41 U/L 47(H) - -  ALT 17 - 63 U/L 45 - -   CBC Latest Ref Rng & Units 05/10/2016 05/09/2016 05/08/2016  WBC 4.0 - 10.5 K/uL 6.7 11.0(H) 19.5(H)  Hemoglobin 13.0 - 17.0 g/dL 14.3 16.4 13.8  Hematocrit 39.0 - 52.0 % 42.9 50.0 42.2  Platelets 150 - 400 K/uL 122(L) 122(L) 117(L)    Lipid Panel  No results found for: CHOL, TRIG, HDL, CHOLHDL, VLDL, LDLCALC, LDLDIRECT HEMOGLOBIN A1C No results found for: HGBA1C, MPG TSH No results for input(s): TSH in the last 8760 hours. Medications   Current Outpatient Medications  Medication Instructions  . allopurinol (ZYLOPRIM) 100 mg, Oral, Daily  . flecainide (TAMBOCOR) 100 mg, Oral, 158m in AM, 1556min PM  .  PARoxetine (PAXIL) 10 mg, Oral, Daily  . rosuvastatin (CRESTOR) 10 mg, Oral, Daily  . telmisartan (MICARDIS) 80 mg, Oral, Daily  . verapamil (COVERA HS) 180 mg, Oral, Daily  . warfarin (COUMADIN) 5 mg, Oral, Daily    Cardiac Studies:   Nuclear stress test:  February 22, 2010: No ischemia, normal LV EF. Chest wall and diaphragmatic attenuation. Low risk   Echocardiogram 05/25/2016: Left ventricle cavity is normal in size. Moderate concentric hypertrophy of the left ventricle. Normal global wall motion. Normal diastolic filling pattern, normal LAP. Calculated EF 61%. Left atrial cavity is mildly dilated at 4.3, Right atrial cavity is mildly dilated. Right ventricle cavity is mildly dilated. Normal right ventricular function. Trace tricuspid regurgitation. Unable to estimate PA pressure due to absence/minimal TR signal. Compared to the study done on 06/13/2012, ejection fraction appears to be improved from 50%.  Assessment     ICD-10-CM   1. Chest pain of uncertain etiology  J03.00 EKG 12-Lead  2. Paroxysmal atrial fibrillation (HCC)  I48.0    CHA2DS2-VASc Score is 3 with yearly risk of stroke of 3.2%.   3. Essential hypertension  I10   4. Class 3 severe obesity due to excess calories with serious comorbidity and body mass index (BMI) of 40.0 to 44.9 in adult (HCC)  E66.01    Z68.41   5. Dyspnea on exertion  R06.09   6. CKD (chronic kidney disease) stage 3, GFR 30-59 ml/min (HCC)  N18.3     EKG 11/19/2018: Marked sinus bradycardia at rate of 52 bpm with first-degree AV block, normal axis, T wave abnormality, cannot  exclude inferior ischemia.  Abnormal EKG. Compared to EKG 08/08/2017, TWI in inverior leads is new.   Recommendations:   Patient made an appointment to see her for evaluation of chest pain, symptoms are worrisome as he also has new EKG changes with T wave abnormality in the inferior leads in 23 and aVF, previously he has had T wave inversion only in lead 3.  Post exertional chest pain is suggestive of angina.  His blood pressure is also elevated for the 1st time, his blood pressure usually is very well controlled on multiple prior visits with the since 2017.  I have added hydralazine 25 an ampule t.i.d. Schedule for a Exercise Nuclear stress test to evaluate for myocardial ischemia. He will hold flecainide and verapamil on the day of the stress test. Will schedule for an echocardiogram.  Patient instructed not to do heavy lifting, heavy exertional activity, swimming until evaluation is complete.  Patient instructed to call if symptoms worse or to go to the ED for further evaluation. Office visit following the work-up/investigations.  Will obtain labs that were previously performed for my evalutation.   Adrian Prows, MD, [facility] 11/19/2018, 9:39 AM [facility] [place] Pager: [phone] Office: [phone] If no answer Cell [phone]

## 2018-12-09 ENCOUNTER — Ambulatory Visit (INDEPENDENT_AMBULATORY_CARE_PROVIDER_SITE_OTHER): Payer: Medicare Other

## 2018-12-09 DIAGNOSIS — R0789 Other chest pain: Secondary | ICD-10-CM | POA: Diagnosis not present

## 2018-12-09 DIAGNOSIS — R9431 Abnormal electrocardiogram [ECG] [EKG]: Secondary | ICD-10-CM

## 2018-12-09 DIAGNOSIS — N183 Chronic kidney disease, stage 3 unspecified: Secondary | ICD-10-CM

## 2018-12-10 ENCOUNTER — Ambulatory Visit (INDEPENDENT_AMBULATORY_CARE_PROVIDER_SITE_OTHER): Payer: Medicare Other

## 2018-12-10 ENCOUNTER — Other Ambulatory Visit: Payer: Self-pay

## 2018-12-10 DIAGNOSIS — I480 Paroxysmal atrial fibrillation: Secondary | ICD-10-CM

## 2018-12-10 DIAGNOSIS — R0609 Other forms of dyspnea: Secondary | ICD-10-CM

## 2018-12-31 ENCOUNTER — Ambulatory Visit: Payer: Medicare Other | Admitting: Cardiology

## 2019-01-09 NOTE — Telephone Encounter (Signed)
error 

## 2019-01-10 ENCOUNTER — Other Ambulatory Visit: Payer: Self-pay

## 2019-01-10 ENCOUNTER — Encounter: Payer: Self-pay | Admitting: Cardiology

## 2019-01-10 ENCOUNTER — Ambulatory Visit (INDEPENDENT_AMBULATORY_CARE_PROVIDER_SITE_OTHER): Payer: Medicare Other | Admitting: Cardiology

## 2019-01-10 VITALS — BP 144/94 | HR 62 | Temp 97.8°F | Ht 70.0 in | Wt 295.0 lb

## 2019-01-10 DIAGNOSIS — I10 Essential (primary) hypertension: Secondary | ICD-10-CM

## 2019-01-10 DIAGNOSIS — I480 Paroxysmal atrial fibrillation: Secondary | ICD-10-CM | POA: Diagnosis not present

## 2019-01-10 DIAGNOSIS — Z6841 Body Mass Index (BMI) 40.0 and over, adult: Secondary | ICD-10-CM

## 2019-01-10 NOTE — Progress Notes (Signed)
Primary Physician/Referring:  Jilda Panda, MD  Patient ID: Ricky Burton, male    DOB: 04-21-1960, 58 y.o.   MRN: [id number]  Chief Complaint  Patient presents with  . Chest Pain  . Palpitations  . Results    echo, lexi  . Follow-up   HPI:    HPI: Ricky Burton  is a 58 y.o. AA male with history of hypertension with CKD stage III, hyperlipidemia, morbid obesity with obstructive sleep apnea on BiPAP, and history of CVA in 2001. Also has history of atrial flutter status post ablation in 2003 in [place], Paroxysmal A. Fib and on Coumadin. This is managed by his PCP.   Patient called and made an appointment to see her as as he was having episodes of mild palpitations, increased fatigue mild dyspnea and also might chest discomfort. This is a  6 weeks office visit and follow-up of exertional chest pain and dyspnea, he underwent nuclear stress test and also echocardiogram.  Fortunately since last office visit he is no clinical chest pain.  States that he is doing well.  Dyspnea is remained stable.   Past Medical History:  Diagnosis Date  . A-fib ([place])   . Anxiety   . Heart disease   . High cholesterol    controlled  . Hypertension   . Morbid obesity ([place])   . OSA (obstructive sleep apnea)   . Stroke [facility])    L sided in 2001   Past Surgical History:  Procedure Laterality Date  . HERNIA REPAIR  1986   Social History   Socioeconomic History  . Marital status: Married    Spouse name: Marliss Coots  . Number of children: 1  . Years of education: HS  . Highest education level: Not on file  Occupational History  . Occupation: Retired   Scientific laboratory technician  . Financial resource strain: Not on file  . Food insecurity    Worry: Not on file    Inability: Not on file  . Transportation needs    Medical: Not on file    Non-medical: Not on file  Tobacco Use  . Smoking status: Never Smoker  . Smokeless tobacco: Never Used  Substance and Sexual Activity  . Alcohol use: No  . Drug  use: No  . Sexual activity: Not on file  Lifestyle  . Physical activity    Days per week: Not on file    Minutes per session: Not on file  . Stress: Not on file  Relationships  . Social Herbalist on phone: Not on file    Gets together: Not on file    Attends religious service: Not on file    Active member of club or organization: Not on file    Attends meetings of clubs or organizations: Not on file    Relationship status: Not on file  . Intimate partner violence    Fear of current or ex partner: Not on file    Emotionally abused: Not on file    Physically abused: Not on file    Forced sexual activity: Not on file  Other Topics Concern  . Not on file  Social History Narrative   Patient is married Garment/textile technologist) and lives at home with his wife.   Patient has two children.   Patient is right-handed.   Patient drinks one cup of coffee daily.      ROS  Review of Systems  Constitution: Positive for malaise/fatigue. Negative for chills, decreased appetite  and weight gain.  Cardiovascular: Positive for dyspnea on exertion. Negative for chest pain, leg swelling and syncope.  Respiratory: Positive for snoring (on BiPAP ).   Endocrine: Negative for cold intolerance.  Hematologic/Lymphatic: Does not bruise/bleed easily.  Musculoskeletal: Negative for joint swelling.  Gastrointestinal: Negative for abdominal pain, anorexia, change in bowel habit, hematochezia and melena.  Neurological: Negative for headaches and light-headedness.  Psychiatric/Behavioral: Negative for depression and substance abuse.  All other systems reviewed and are negative.  Objective  Blood pressure (!) 155/106, pulse 62, temperature 97.8 F (36.6 C), height _0  (1.778 m), weight 295 lb (133.8 kg), SpO2 95 %. Body mass index is 42.33 kg/m.   Physical Exam  Constitutional: He appears well-developed. No distress.  Morbidly obese  HENT:  Head: Atraumatic.  Eyes: Conjunctivae are normal.  Neck: Neck  supple. No thyromegaly present.  Short neck and difficult to evaluate JVP  Cardiovascular: Normal rate, regular rhythm and normal heart sounds. Exam reveals no gallop.  No murmur heard. Pulses:      Carotid pulses are 2+ on the right side and 2+ on the left side.      Dorsalis pedis pulses are 2+ on the right side and 2+ on the left side.       Posterior tibial pulses are 2+ on the right side and 2+ on the left side.  Femoral and popliteal pulse difficult to feel due to patient's body habitus.   Pulmonary/Chest: Effort normal and breath sounds normal.  Abdominal: Soft. Bowel sounds are normal.  Obese. Pannus present  Musculoskeletal: Normal range of motion.        General: No edema.  Neurological: He is alert.  Skin: Skin is warm and dry.  Psychiatric: He has a normal mood and affect.   Radiology: No results found.  Laboratory examination:   Labs 12/03/2018: HB 15.6/HCT 47.9, platelets 151, INR 2.2.  Total cholesterol 114, triglycerides 65, HDL 50, LDL 51.  BUN 21, creatinine 1.43, CMP otherwise normal.  07/12/2017: Potassium 4.8, creatinine 1.9, CMP otherwise normal.  Cholesterol 94, triglycerides 55, HDL 42, LDL 41. Vitamin D 31.3.  06/20/2016: CBC normal. Potassium 4.4, entire CMP not available for review.  05/24/2016: Creatinine 1.5, EGFR 57, sodium 133, potassium 4.6, BMP normal. CMP Latest Ref Rng & Units 05/10/2016 05/10/2016 05/09/2016  Glucose 65 - 99 mg/dL 97 100(H) 118(H)  BUN 6 - 20 mg/dL _1 Creatinine 0.61 - 1.24 mg/dL 1.70(H) 1.77(H) 1.78(H)  Sodium 135 - 145 mmol/L 137 138 140  Potassium 3.5 - 5.1 mmol/L 3.9 4.0 4.1  Chloride 101 - 111 mmol/L 108 109 107  CO2 22 - 32 mmol/L 21(L) 22 24  Calcium 8.9 - 10.3 mg/dL 8.2(L) 8.2(L) 8.7(L)  Total Protein 6.5 - 8.1 g/dL 6.4(L) - -  Total Bilirubin 0.3 - 1.2 mg/dL 1.5(H) - -  Alkaline Phos 38 - 126 U/L 74 - -  AST 15 - 41 U/L 47(H) - -  ALT 17 - 63 U/L 45 - -   CBC Latest Ref Rng & Units 05/10/2016 05/09/2016  05/08/2016  WBC 4.0 - 10.5 K/uL 6.7 11.0(H) 19.5(H)  Hemoglobin 13.0 - 17.0 g/dL 14.3 16.4 13.8  Hematocrit 39.0 - 52.0 % 42.9 50.0 42.2  Platelets 150 - 400 K/uL 122(L) 122(L) 117(L)   Lipid Panel  No results found for: CHOL, TRIG, HDL, CHOLHDL, VLDL, LDLCALC, LDLDIRECT HEMOGLOBIN A1C No results found for: HGBA1C, MPG TSH No results for input(s): TSH in the last 8760 hours. Medications  Current Outpatient Medications  Medication Instructions  . allopurinol (ZYLOPRIM) 100 mg, Oral, Daily  . flecainide (TAMBOCOR) 100 mg, Oral, 187m in AM, 1581min PM  . PARoxetine (PAXIL) 10 mg, Oral, Daily  . rosuvastatin (CRESTOR) 10 mg, Oral, Daily  . telmisartan (MICARDIS) 80 mg, Oral, Daily  . verapamil (COVERA HS) 180 mg, Oral, Daily  . warfarin (COUMADIN) 5 mg, Oral, Daily    Cardiac Studies:   Lexiscan Myoview Stress Test 12/09/2018: 1. Stress EKG is non-diagnostic, as this is pharmacological stress test. 2. The perfusion imaging study demonstrates soft tissue attenuation artifact in the inferior wall. There is no demonstrable ischemia or scar. All segments of left ventricle demonstrated normal wall motion and thickening. Stress LV EF is mildly dysfunctional 48% although visually appears normal. This is a low risk study. Clinical correlation recommended in the patient's with a BMI of 41. 3. Compared to the report from 02/18/2010, no significant change.  Previously EF reported to be normal.   Echocardiogram 12/10/2018: Left ventricle cavity is normal in size. Moderate concentric hypertrophy of the left ventricle. Normal LV systolic function with EF 55%. Normal global wall motion. Indeterminate diastolic filling pattern.  Left atrial cavity is mildly dilated. No hemodynamically significant valvular abnormality. Normal right atrial pressure.  No significant change compared to previous study in 2018.   Assessment     ICD-10-CM   1. Paroxysmal atrial fibrillation (HCC)  I48.0 EKG 12-Lead     EKG 01/10/2019: Sinus bradycardia with first-degree AV block at the rate of 61 bpm, normal axis, low voltage complexes.  Normal QT interval. Compared to the EKG of  11/19/2018, inferior T wave inversion not present.  Recommendations:   6 weeks office visit and follow-up of exertional chest pain and dyspnea, he underwent nuclear stress test and also echocardiogram.  I reviewed the results with the patient.  Fortunately he has not had recurrence of chest pain and the stress test is also noticed.  I suspect previously noted EKG abnormality with T-wave inversion is probably related to uncontrolled hypertension.    Blood pressure was elevated today, patient states that his blood pressure is perfectly well controlled at home and also with his PCP and he just heard the dose of his sister being put on hospice.  Hence I did not make any changes but he'll keep an eye on the blood pressure and let me know if it is greater than 130/80 mmHg.  Weight loss was again discussed with the patient.  Continue anticoagulation as per PCP.  I'll see him back in a year.  He is advised to contact me if he has recurrence of chest pain. I reviewed his labs from PCP.   JaAdrian ProwsMD, FAMesa Springs0/05/2018, 3:21 PM [facility]townardiovascular. PALoch Lloydager: [phone] Office: [phone] no answer Cell [phone]

## 2019-06-26 ENCOUNTER — Ambulatory Visit: Payer: Self-pay | Admitting: Neurology

## 2019-07-24 ENCOUNTER — Ambulatory Visit: Payer: Self-pay | Admitting: Neurology

## 2019-08-04 ENCOUNTER — Ambulatory Visit (INDEPENDENT_AMBULATORY_CARE_PROVIDER_SITE_OTHER): Payer: Medicare Other | Admitting: Neurology

## 2019-08-04 ENCOUNTER — Encounter: Payer: Self-pay | Admitting: Neurology

## 2019-08-04 ENCOUNTER — Other Ambulatory Visit: Payer: Self-pay

## 2019-08-04 VITALS — BP 136/89 | HR 54 | Temp 97.2°F | Ht 69.0 in | Wt 295.0 lb

## 2019-08-04 DIAGNOSIS — I480 Paroxysmal atrial fibrillation: Secondary | ICD-10-CM | POA: Diagnosis not present

## 2019-08-04 DIAGNOSIS — N1831 Chronic kidney disease, stage 3a: Secondary | ICD-10-CM | POA: Diagnosis not present

## 2019-08-04 DIAGNOSIS — G4733 Obstructive sleep apnea (adult) (pediatric): Secondary | ICD-10-CM | POA: Diagnosis not present

## 2019-08-04 DIAGNOSIS — Z6841 Body Mass Index (BMI) 40.0 and over, adult: Secondary | ICD-10-CM

## 2019-08-04 NOTE — Progress Notes (Signed)
SLEEP MEDICINE CLINIC   Provider:  Larey Seat, M D  Referring Provider: Jilda Panda, MD Primary Care Physician:  Jilda Panda, MD  Chief Complaint  Patient presents with  . Follow-up    pt alone, rm 10. pt states things are going well. DME [facility]. here to follow up for supplies       HPI: 08-04-2019: Ricky Burton  is a 59 y.o. AA male with history of hypertension with CKD stage III, hyperlipidemia, morbid obesity with obstructive sleep apnea on BiPAP, and history of CVA in 2001. He underwent BiPAP titration in 05-2016 and remained on the same settings.  Also has history of atrial flutter status post ablation in 2003 in [place], Paroxysmal A. Fib and on Coumadin. This is managed by his PCP.  Fortunately since last office visit he is no clinical chest pain.  States that he is doing well.  Dyspnea is remained stable.  [name] reports BiPAP titration had shown the greatest benefit of the setting of 12/8 cmH2O and he has been doing very well on the current setting his residual AHI is 4.1 he is 800% compliant patient and he would reports that he feels his sleep is so much better he can hardly sleep without the BiPAP well.  Average use at time is 6 hours 26 minutes each night he has very little air leak the average time per night in air leakage is 18 seconds.  So there is no problem with the mask fit.  Humidifier setting is currently on level 4 ramp time is only 5 minutes and he has a 2 cm by flex setting.  From these results I do not think we need to change anything if the patient would feel that he is short of breath at night and that he has swallowed air on a regular basis I would have to adjust the inspiratory pressure at this time I think we can leave it where it is.  Patient recieved both COVID shots, is 14 days post second dose now. Pfizer         Ricky Burton is a 59 y.o. male , seen last on 06-01-2016, as a referral  from Dr. Mellody Drown for  Re-establishing sleep care.  I had seen Mr. Bardia about 8 years prior for the last time and at the time had placed him on ASV BiPAP which he continues to use. In the meantime he did have several new health problems including paroxysmal atrial fibrillation with long-term use of anticoagulants, pain, hypertensive heart disease without heart failure, gout,  Still has superobesity, hypercholesterolemia, and he was admitted in late January until early February  2018 with sepsis. He developed again atrial flutter this time and the cardioversion did not take hold. He has now a higher dose of flecainide to take and states that this has controlled his heart rate. Ricky Burton also has been talking to his primary care physician about significant weight loss and received information about dietary and nutritional approaches to weight loss, and exercise regimen and the remaining surgical options. There is a new clinic with an [place] led by Dr. Leafy Ro and located within the [facility] [place] on  [address] that is specialized in  bariatric medicinal/  nonsurgical option. I would like to add that as the patient provided a data download for his current CPAP, he is actually using BiPAP setting of 17/12 cm water with easy breathing function has used it for 97% of the last  30 days and is considered highly.compliant, with the average user time of 7 hours at night and a residual AHI of only 2.3.  His primary care physician provided a copy of his last sleep study, based on complaints of witnessed apneas, loud snoring, nocturia and dry mouth. 2008 his AHI was 80 and he was titrated to 14 cm water on CPAP but gained further weight, about 60 pounds in the following 6 years. By 2013 his pressure needs changed and his Epworth sleepiness score was endorsed at 18 points. He used BiPAP therapy with a residual AHI at 17 per hour. He was changed to a ASV BiPAP machine.  Chief complaint according to patient : feeling tired again, but is using BiPAP   Sleep habits  are as follows: The patient usually goes to bed at midnight, and falls asleep promptly by using his BiPAP. He stays asleep for about 4-1/2 hours before he has his first bathroom break he just wakes up spontaneously. After that he has trouble to reinitiate initiate sleep. He starts to read when he wakes up early but that may arise at about 5:30. The reading has not helped him to find sleep. He has only one bathroom break at night and usually he is awake when he goes it is not the urge to urinate that wakes him out of sleep. He does not wake with headaches, palpitations, is not diaphoretic or nauseated.  He does not feel that he is completely rested nor restored but he struggles to find more sleep. His bedroom is cool, quiet and dark. He shares a bedroom with his wife. She has not noticed any new apneas of breakthrough snoring while using his device. Has witnessed air leaks, sometimes loud noises. The water reservoir leaks and the door doesn't close. He may ned a new machine. He was initially placed on positive airway pressure with the addition of oxygen being bled into the machine, but in the meantime he has sometimes traveled without taking the oxygen concentrator or tank with him and felt not different. In the hospital he was not placed on oxygen either. This indicates that oxygen is probably not needed any longer.    Sleep medical history and family sleep history:  Adopted, one daughter , who is healthy , 46 years old.   Social history: Married, one adult daughter, nonsmoker, nondrinker, caffeine use is limited on coffee a day, no soda and no  ice tea. 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   Review of Systems: Out of a complete 14 system review, the patient complains of only the following symptoms, and all other reviewed systems are negative.   Epworth score iwas pre- BIPAP  endorsed at 12 points, fatigue severity score was  endorsed at this tea points no evidence of major depression with a history of depression, the patient reports feeling that he doesn't get enough sleep, but he has sleep insomnia not by initiating sleep late but by not being able to sustain sleep.   How likely are you to doze in the following situations: 0 = not likely, 1 = slight chance, 2 = moderate chance, 3 = high chance  Sitting and Reading? Watching Television? Sitting inactive in a public place (theater or meeting)? Lying down in the afternoon when circumstances permit? Sitting and talking to someone? Sitting quietly after lunch without alcohol? In a car, while stopped for a few minutes in  traffic? As a passenger in a car for an hour without a break?  Total = 4/ 24 -   [facility] DME>      Social History   Socioeconomic History  . Marital status: Married    Spouse name: Marliss Coots  . Number of children: 1  . Years of education: HS  . Highest education level: Not on file  Occupational History  . Occupation: Retired   Tobacco Use  . Smoking status: Never Smoker  . Smokeless tobacco: Never Used  Substance and Sexual Activity  . Alcohol use: No  . Drug use: No  . Sexual activity: Not on file  Other Topics Concern  . Not on file  Social History Narrative   Patient is married Garment/textile technologist) and lives at home with his wife.   Patient has two children.   Patient is right-handed.   Patient drinks one cup of coffee daily.      Social Determinants of Health   Financial Resource Strain:   . Difficulty of Paying Living Expenses:   Food Insecurity:   . Worried About Charity fundraiser in the Last Year:   . Arboriculturist in the Last Year:   Transportation Needs:   . Film/video editor (Medical):   Marland Kitchen Lack of Transportation (Non-Medical):   Physical Activity:   . Days of Exercise per Week:   . Minutes of Exercise per Session:   Stress:   . Feeling of Stress :   Social Connections:   . Frequency of Communication with  Friends and Family:   . Frequency of Social Gatherings with Friends and Family:   . Attends Religious Services:   . Active Member of Clubs or Organizations:   . Attends Archivist Meetings:   Marland Kitchen Marital Status:   Intimate Partner Violence:   . Fear of Current or Ex-Partner:   . Emotionally Abused:   Marland Kitchen Physically Abused:   . Sexually Abused:     Family History  Problem Relation Age of Onset  . Alzheimer's disease Mother     Past Medical History:  Diagnosis Date  . A-fib ([place])   . Anxiety   . Heart disease   . High cholesterol    controlled  . Hypertension   . Morbid obesity ([place])   . OSA (obstructive sleep apnea)   . Stroke [facility])    L sided in 2001    Past Surgical History:  Procedure Laterality Date  . HERNIA REPAIR  1986    Current Outpatient Medications  Medication Sig Dispense Refill  . allopurinol (ZYLOPRIM) 100 MG tablet Take 100 mg by mouth daily.    . flecainide (TAMBOCOR) 100 MG tablet Take 100 mg by mouth. 100mg  in AM, 150mg  in PM    . PARoxetine (PAXIL) 10 MG tablet Take 5 mg by mouth daily.     . rosuvastatin (CRESTOR) 10 MG tablet Take 10 mg by mouth daily.    Marland Kitchen telmisartan (MICARDIS) 80 MG tablet Take 80 mg by mouth daily.    . verapamil (COVERA HS) 180 MG (CO) 24 hr tablet Take 180 mg by mouth daily.    . Vitamin D, Ergocalciferol, (DRISDOL) 1.25 MG (50000 UNIT) CAPS capsule Take 50,000 Units by mouth once a week.    . warfarin (COUMADIN) 5 MG tablet Take 5 mg by mouth daily.     No current facility-administered medications for this visit.    Allergies as of 08/04/2019 - Review Complete 08/04/2019  Allergen Reaction  Noted  . Advair diskus [fluticasone-salmeterol] Palpitations 05/07/2016    Vitals: BP 136/89   Pulse (!) 54   Temp (!) 97.2 F (36.2 C)   Ht 5\' 9"  (1.753 m)   Wt 295 lb (133.8 kg)   BMI 43.56 kg/m  Last Weight:  Wt Readings from Last 1 Encounters:  08/04/19 295 lb (133.8 kg)   [id number] mass index is 43.56 kg/m.      Last Height:   Ht Readings from Last 1 Encounters:  08/04/19 5\' 9"  (1.753 m)    Physical exam:  General: The patient is awake, alert and appears not in acute distress. The patient is well groomed. Head: Normocephalic, atraumatic. Neck is supple. Mallampati 4  neck circumference: 20. Nasal airflow  Patent = mildly congested . Retrognathia is seen.  No use of retainers, braces or dentures. Cardiovascular:  irregular rate and rhythm - but slow , without  murmurs or carotid bruit, and without distended neck veins. Respiratory: Lungs are clear to auscultation. Skin:  Without evidence of edema, or rash Trunk: BMI is super obese . The patient's posture is stooped  Neurologic exam : The patient is awake and alert, oriented to place and time.    Cranial nerves: no loss of smell or taste -  Pupils are equal and briskly reactive to light. Extraocular movements  in vertical and horizontal planes intact and without nystagmus.  Hearing to finger rub intact.  Facial sensation intact to fine touch. Facial motor strength is symmetric and his tongue and uvula move midline. Shoulder shrug was symmetrical.   Motor exam: Normal tone, muscle bulk and symmetric strength in all extremities. He had a left sided hemiparesis after his stroke. Sensory:  Fine touch, pinprick and vibration were tested in all extremities.  Numbness in left hand. Coordination: Rapid alternating movements/Finger-to-nose maneuver  normal without evidence of ataxia, dysmetria or tremor. Gait and station: Patient walks without assistive device and is able unassisted to climb up to the exam table. Strength within normal limits. Stance is stable and normal.  Deep tendon reflexes: in the upper and lower extremities are symmetric and intact.   The patient was advised of the nature of the diagnosed sleep disorder , the treatment options and risks for general a health and wellness arising from not treating the condition.  I spent more than 20  minutes of face to face time with the patient.   Assessment:  After physical and neurologic examination, review of laboratory studies,  Personal review of imaging studies, reports of other /same  Imaging studies ,  Results of polysomnography/ neurophysiology testing and pre-existing records as far as provided in visit., my assessment is   1)  Keep on BiPAP at current settings.  OSA risik factors are morbid obesity, hx of stroke, atrial fib and flutter, therapy-  2) Interested in weight loss therapy- he lost down to 270 pounds early  last year an stopped exercising during the pandemic,regaining about 26 pounds. I recommnended Dr. Migdalia Dk outfit.     RV in 12 month -   Masato Pettie MD  08/04/2019   CC: Jilda Panda, Md [address] [place],  [place] [id number]

## 2019-08-04 NOTE — Patient Instructions (Signed)

## 2020-01-12 ENCOUNTER — Encounter: Payer: Self-pay | Admitting: Cardiology

## 2020-01-12 ENCOUNTER — Other Ambulatory Visit: Payer: Self-pay

## 2020-01-12 ENCOUNTER — Ambulatory Visit: Payer: Medicare Other | Admitting: Cardiology

## 2020-01-12 VITALS — BP 143/84 | HR 67 | Resp 16 | Ht 69.0 in | Wt 288.2 lb

## 2020-01-12 DIAGNOSIS — I10 Essential (primary) hypertension: Secondary | ICD-10-CM

## 2020-01-12 DIAGNOSIS — Z6841 Body Mass Index (BMI) 40.0 and over, adult: Secondary | ICD-10-CM

## 2020-01-12 DIAGNOSIS — N1831 Chronic kidney disease, stage 3a: Secondary | ICD-10-CM

## 2020-01-12 DIAGNOSIS — I480 Paroxysmal atrial fibrillation: Secondary | ICD-10-CM

## 2020-01-12 NOTE — Progress Notes (Signed)
Primary Physician/Referring:  Jilda Panda, MD  Patient ID: Ricky Burton, male    DOB: [birth year], 59 y.o.   MRN: [id number]  Chief Complaint  Patient presents with  . Paroxysmal atrial fibrillation (HCC)  . Hypertension  . Follow-up    1 year    HPI:    HPI: Ricky Burton  is a 59 y.o. AA male with history of hypertension with CKD stage III, hyperlipidemia, morbid obesity with obstructive sleep apnea on BiPAP, and history of CVA in 2001. Also has history of atrial flutter status post ablation in 2003 in [place], Paroxysmal A. Fib and on Coumadin. This is managed by his PCP.   Patient presents for annual follow-up of hypertension and paroxysmal atrial fibrillation.  He is presently doing well and remains asymptomatic.  Denies chest pain, shortness of breath, fatigue.  He is compliant with BiPAP for obstructive sleep apnea, and continues to follow with his PCP for management of Coumadin.  He reports INR has been stable.  He monitors his blood pressure regularly at home and reports numbers 125-130/70s.  He has not had recurrence of atrial fibrillation to his knowledge.  Tolerating Coumadin well without bleeding diathesis.  Of note he reports he recently had blood work done by his PCP. He also mentioned that he has recently had trouble sleeping, PCP has started patient on Belsomra for this.   Past Medical History:  Diagnosis Date  . A-fib ([place])   . Anxiety   . Heart disease   . High cholesterol    controlled  . Hypertension   . Morbid obesity ([place])   . OSA (obstructive sleep apnea)   . Stroke [facility])    L sided in 2001   Past Surgical History:  Procedure Laterality Date  . HERNIA REPAIR  1986   Social History   Tobacco Use  . Smoking status: Never Smoker  . Smokeless tobacco: Never Used  Substance Use Topics  . Alcohol use: No  Marital Status: Married  ROS  Review of Systems  Constitutional: Positive for malaise/fatigue. Negative for chills, decreased appetite and  weight gain.  Cardiovascular: Negative for chest pain, claudication, dyspnea on exertion, leg swelling, near-syncope, palpitations and syncope.  Respiratory: Positive for snoring (on BiPAP ). Negative for shortness of breath.   Endocrine: Negative for cold intolerance.  Hematologic/Lymphatic: Does not bruise/bleed easily.  Musculoskeletal: Negative for joint swelling.  Gastrointestinal: Negative for abdominal pain, anorexia, change in bowel habit, hematochezia and melena.  Neurological: Negative for headaches and light-headedness.  Psychiatric/Behavioral: Negative for depression and substance abuse.  All other systems reviewed and are negative.  Objective  Blood pressure (!) 143/84, pulse 67, resp. rate 16, height 5' 9"  (1.753 m), weight 288 lb 3.2 oz (130.7 kg), SpO2 94 %. Body mass index is 42.56 kg/m.   Physical Exam Constitutional:      General: He is not in acute distress.    Appearance: He is well-developed.     Comments: Morbidly obese  HENT:     Head: Atraumatic.  Neck:     Thyroid: No thyromegaly.     Comments: Short neck and difficult to evaluate JVP Cardiovascular:     Rate and Rhythm: Normal rate and regular rhythm.     Pulses:          Carotid pulses are 2+ on the right side and 2+ on the left side.      Dorsalis pedis pulses are 2+ on the right side and 2+ on  the left side.       Posterior tibial pulses are 2+ on the right side and 2+ on the left side.     Heart sounds: Normal heart sounds. No murmur heard.  No gallop.      Comments: Femoral and popliteal pulse difficult to feel due to patient's body habitus.  Pulmonary:     Effort: Pulmonary effort is normal.     Breath sounds: Normal breath sounds.  Abdominal:     Comments: Obese. Pannus present  Musculoskeletal:     Cervical back: Neck supple.  Skin:    General: Skin is warm and dry.  Neurological:     Mental Status: He is alert.    Laboratory examination:   CMP Latest Ref Rng & Units 05/10/2016  05/10/2016 05/09/2016  Glucose 65 - 99 mg/dL 97 100(H) 118(H)  BUN 6 - 20 mg/dL 16 15 16   Creatinine 0.61 - 1.24 mg/dL 1.70(H) 1.77(H) 1.78(H)  Sodium 135 - 145 mmol/L 137 138 140  Potassium 3.5 - 5.1 mmol/L 3.9 4.0 4.1  Chloride 101 - 111 mmol/L 108 109 107  CO2 22 - 32 mmol/L 21(L) 22 24  Calcium 8.9 - 10.3 mg/dL 8.2(L) 8.2(L) 8.7(L)  Total Protein 6.5 - 8.1 g/dL 6.4(L) - -  Total Bilirubin 0.3 - 1.2 mg/dL 1.5(H) - -  Alkaline Phos 38 - 126 U/L 74 - -  AST 15 - 41 U/L 47(H) - -  ALT 17 - 63 U/L 45 - -   CBC Latest Ref Rng & Units 05/10/2016 05/09/2016 05/08/2016  WBC 4.0 - 10.5 K/uL 6.7 11.0(H) 19.5(H)  Hemoglobin 13.0 - 17.0 g/dL 14.3 16.4 13.8  Hematocrit 39 - 52 % 42.9 50.0 42.2  Platelets 150 - 400 K/uL 122(L) 122(L) 117(L)   Lipid Panel  No results found for: CHOL, TRIG, HDL, CHOLHDL, VLDL, LDLCALC, LDLDIRECT HEMOGLOBIN A1C No results found for: HGBA1C, MPG TSH No results for input(s): TSH in the last 8760 hours.   External Labs:  12/25/2019: Total cholesterol 120, triglycerides 56, HDL 53, LDL 56 Hemoglobin 15.5, hematocrit 47.4, CBC otherwise within normal limits PT 22.9, INR 2.1 Sodium 144, potassium 5.1, BUN 21, creatinine 1.3, GFR 66, CMP otherwise within normal limits Vit D 24.8  12/03/2018: HB 15.6/HCT 47.9, platelets 151, INR 2.2.  Total cholesterol 114, triglycerides 65, HDL 50, LDL 51.  BUN 21, creatinine 1.43, CMP otherwise normal.  07/12/2017: Potassium 4.8, creatinine 1.9, CMP otherwise normal.  Cholesterol 94, triglycerides 55, HDL 42, LDL 41. Vitamin D 31.3.  06/20/2016: CBC normal. Potassium 4.4, entire CMP not available for review.  05/24/2016: Creatinine 1.5, EGFR 57, sodium 133, potassium 4.6, BMP normal.  Medications   Current Outpatient Medications  Medication Instructions  . allopurinol (ZYLOPRIM) 100 mg, Oral, Daily  . BELSOMRA 20 MG TABS 1 tablet, Oral, Daily at bedtime  . flecainide (TAMBOCOR) 100 mg, Oral, 169m in AM, 1525min PM  .  PARoxetine (PAXIL) 5 mg, Oral, Daily  . rosuvastatin (CRESTOR) 10 mg, Oral, Daily  . telmisartan (MICARDIS) 80 mg, Oral, Daily  . verapamil (COVERA HS) 180 mg, Oral, Daily  . Vitamin D (Ergocalciferol) (DRISDOL) 50,000 Units, Oral, Weekly  . warfarin (COUMADIN) 5 mg, Oral, Daily     Radiology:    No results found.  Cardiac Studies:   Lexiscan Myoview Stress Test 12/09/2018: 1. Stress EKG is non-diagnostic, as this is pharmacological stress test. 2. The perfusion imaging study demonstrates soft tissue attenuation artifact in the inferior wall. There is no  demonstrable ischemia or scar. All segments of left ventricle demonstrated normal wall motion and thickening. Stress LV EF is mildly dysfunctional 48% although visually appears normal. This is a low risk study. Clinical correlation recommended in the patient's with a BMI of 41. 3. Compared to the report from 02/18/2010, no significant change.  Previously EF reported to be normal.   Echocardiogram 12/10/2018: Left ventricle cavity is normal in size. Moderate concentric hypertrophy of the left ventricle. Normal LV systolic function with EF 55%. Normal global wall motion. Indeterminate diastolic filling pattern.  Left atrial cavity is mildly dilated. No hemodynamically significant valvular abnormality. Normal right atrial pressure.  No significant change compared to previous study in 2018.   EKG:   EKG 01/12/2020: Sinus rhythm at a rate of 66 bpm with first-degree AV block, left atrial enlargement.  Normal axis. Low voltage complexes. Compared to EKG 01/10/2019, new left atrial enlargement.   Assessment     ICD-10-CM   1. Paroxysmal atrial fibrillation (HCC)  I48.0 EKG 12-Lead  2. Class 3 severe obesity due to excess calories with serious comorbidity and body mass index (BMI) of 40.0 to 44.9 in adult ([place])  E66.01    Z68.41   3. Essential hypertension  I10   4. Stage 3a chronic kidney disease (HCC)  N18.31     No orders of the  defined types were placed in this encounter.  There are no discontinued medications.   Recommendations:   Maddon CLEOPHUS MENDONSA  is a 59 y.o. AA male with history of hypertension with CKD stage III, hyperlipidemia, morbid obesity with obstructive sleep apnea on BiPAP, and history of CVA in 2001. Also has history of atrial flutter status post ablation in 2003 in [place], Paroxysmal A. Fib and on Coumadin. This is managed by his PCP.   Patient presents for annual follow-up, he is doing well and remains asymptomatic.  EKG today remains unchanged compared to previous, he is maintaining sinus rhythm.  Will continue Flecainide 100 mg in the morning and 150 mg in the evening, verapamil 180 mg daily.  Continue anticoagulation with warfarin as managed by PCP.  Patient's blood pressure elevated in the office today, however he monitors it daily at home and reports it is well controlled.  Will continue telmisartan 80 mg daily.  Reviewed external labs, lipids are well controlled, renal function remains stable.  He is currently taking rosuvastatin 10 mg daily. Will defer continued management of hyperlipidemia to PCP.   Again discussed the importance of weight loss and daily activity with patient.  He expressed understanding.  Follow-up in 1 year for paroxysmal atrial fibrillation, hypertension, hyperlipidemia.  Patient was seen in collaboration with Dr. Einar Gip. He also reviewed patient's chart and examined the patient. Dr. Einar Gip is in agreement of the plan.    Alethia Berthold, PA-C 01/12/2020, 11:57 AM Office: [phone]

## 2020-01-14 ENCOUNTER — Other Ambulatory Visit: Payer: Self-pay | Admitting: Gastroenterology

## 2020-02-04 ENCOUNTER — Encounter: Payer: Self-pay | Admitting: Cardiology

## 2020-02-10 ENCOUNTER — Telehealth: Payer: Self-pay | Admitting: Neurology

## 2020-02-10 NOTE — Telephone Encounter (Signed)
Called and spoke with the wife. I asked the important questions related to the machine in regards to does he use a ozone cleaning product, left in heat and if he has noticed any black particles in water chamber. She answered no to all the questions. Informed her that Dr Brett Fairy has advised that as long as his machine is working well and there are no issues or concerns then he can continue using the machine at his discretion. She verbalized understanding and she has registered the machine already through [place]. She was appreciative for the call and feedback.

## 2020-02-10 NOTE — Telephone Encounter (Signed)
Pt's wife Lawerence Dery (on [place]) called, CPAP machine has been recalled. DME company instructed me to call the physician to see if he needs to stop using the CPAP machine. Would like a call from the nurse.

## 2020-02-16 ENCOUNTER — Encounter: Payer: Self-pay | Admitting: Cardiology

## 2020-02-17 ENCOUNTER — Other Ambulatory Visit (HOSPITAL_COMMUNITY)
Admission: RE | Admit: 2020-02-17 | Discharge: 2020-02-17 | Disposition: A | Discharge status: Home / Self Care | Payer: Medicare Other | Source: Ambulatory Visit | Attending: Gastroenterology | Admitting: Gastroenterology

## 2020-02-17 DIAGNOSIS — Z20822 Contact with and (suspected) exposure to covid-19: Secondary | ICD-10-CM | POA: Diagnosis not present

## 2020-02-17 DIAGNOSIS — Z01812 Encounter for preprocedural laboratory examination: Secondary | ICD-10-CM | POA: Insufficient documentation

## 2020-02-17 LAB — SARS CORONAVIRUS 2 (TAT 6-24 HRS): SARS Coronavirus 2: NEGATIVE

## 2020-02-19 NOTE — Anesthesia Preprocedure Evaluation (Addendum)
Anesthesia Evaluation  Patient identified by MRN, date of birth, ID band Patient awake    Reviewed: Allergy & Precautions, NPO status , Patient's Chart, lab work & pertinent test results  Airway Mallampati: III  TM Distance: >3 FB Neck ROM: Full    Dental no notable dental hx. (+) Teeth Intact, Dental Advisory Given   Pulmonary sleep apnea and Continuous Positive Airway Pressure Ventilation ,    Pulmonary exam normal breath sounds clear to auscultation       Cardiovascular hypertension, Pt. on medications Normal cardiovascular exam+ dysrhythmias Atrial Fibrillation  Rhythm:Irregular Rate:Abnormal  12/10/18 Echo Left ventricle cavity is normal in size. Moderate concentric hypertrophy  of the left ventricle. Normal LV systolic function with EF 55%. Normal  global wall motion. Indeterminate diastolic filling pattern.    Neuro/Psych PSYCHIATRIC DISORDERS Anxiety L sided CVA 2001 CVA, No Residual Symptoms    GI/Hepatic Neg liver ROS,   Endo/Other  Morbid obesity  Renal/GU Renal InsufficiencyRenal diseaseStage 3     Musculoskeletal   Abdominal (+) + obese,   Peds  Hematology   Anesthesia Other Findings   Reproductive/Obstetrics                           Anesthesia Physical Anesthesia Plan  ASA: III  Anesthesia Plan: MAC   Post-op Pain Management:    Induction:   PONV Risk Score and Plan: Treatment may vary due to age or medical condition  Airway Management Planned: Natural Airway and Nasal Cannula  Additional Equipment: None  Intra-op Plan:   Post-operative Plan:   Informed Consent:     Dental advisory given  Plan Discussed with: CRNA  Anesthesia Plan Comments: (Screening colonoscopy  All to Advair)        Anesthesia Quick Evaluation

## 2020-02-20 ENCOUNTER — Encounter (HOSPITAL_COMMUNITY): Payer: Self-pay | Admitting: Gastroenterology

## 2020-02-20 ENCOUNTER — Ambulatory Visit (HOSPITAL_COMMUNITY)
Admission: RE | Admit: 2020-02-20 | Discharge: 2020-02-20 | Disposition: A | Discharge status: Home / Self Care | Payer: Medicare Other | Attending: Gastroenterology | Admitting: Gastroenterology

## 2020-02-20 ENCOUNTER — Other Ambulatory Visit: Payer: Self-pay

## 2020-02-20 ENCOUNTER — Ambulatory Visit (HOSPITAL_COMMUNITY): Payer: Medicare Other | Admitting: Anesthesiology

## 2020-02-20 ENCOUNTER — Encounter (HOSPITAL_COMMUNITY)
Admission: RE | Disposition: A | Discharge status: Home / Self Care | Payer: Self-pay | Source: Home / Self Care | Attending: Gastroenterology

## 2020-02-20 DIAGNOSIS — D125 Benign neoplasm of sigmoid colon: Secondary | ICD-10-CM | POA: Insufficient documentation

## 2020-02-20 DIAGNOSIS — Z1211 Encounter for screening for malignant neoplasm of colon: Secondary | ICD-10-CM | POA: Insufficient documentation

## 2020-02-20 DIAGNOSIS — Z888 Allergy status to other drugs, medicaments and biological substances status: Secondary | ICD-10-CM | POA: Insufficient documentation

## 2020-02-20 DIAGNOSIS — K5730 Diverticulosis of large intestine without perforation or abscess without bleeding: Secondary | ICD-10-CM | POA: Insufficient documentation

## 2020-02-20 HISTORY — PX: COLONOSCOPY WITH PROPOFOL: SHX5780

## 2020-02-20 HISTORY — PX: POLYPECTOMY: SHX5525

## 2020-02-20 LAB — PROTIME-INR
INR: 1.3 — ABNORMAL HIGH (ref 0.8–1.2)
Prothrombin Time: 15.2 seconds (ref 11.4–15.2)

## 2020-02-20 SURGERY — COLONOSCOPY WITH PROPOFOL
Anesthesia: Monitor Anesthesia Care

## 2020-02-20 MED ORDER — PROPOFOL 10 MG/ML IV BOLUS
INTRAVENOUS | Status: DC | PRN
  Administered 2020-02-20 (×7): 50 mg via INTRAVENOUS

## 2020-02-20 MED ORDER — LACTATED RINGERS IV SOLN
INTRAVENOUS | Status: DC
  Administered 2020-02-20: 1000 mL via INTRAVENOUS

## 2020-02-20 MED ORDER — SODIUM CHLORIDE 0.9 % IV SOLN: INTRAVENOUS | Status: DC

## 2020-02-20 MED ORDER — LIDOCAINE 2% (20 MG/ML) 5 ML SYRINGE
INTRAMUSCULAR | Status: DC | PRN
  Administered 2020-02-20: 40 mg via INTRAVENOUS

## 2020-02-20 SURGICAL SUPPLY — 22 items

## 2020-02-20 NOTE — Discharge Instructions (Signed)

## 2020-02-20 NOTE — H&P (Signed)
  Ricky Burton HPI: This is a 59 year old male with a PMH of afib on coumadin, HTN, and OSA here for a screening colonoscopy.  The patient denies any issues with hematochezia, melena, abdominal pain, nausea, vomiting, chest pain, SOB, or MI.  Past Medical History:  Diagnosis Date  . A-fib ([place])   . Anxiety   . Heart disease   . High cholesterol    controlled  . Hypertension   . Morbid obesity ([place])   . OSA (obstructive sleep apnea)   . Stroke [facility])    L sided in 2001    Past Surgical History:  Procedure Laterality Date  . HERNIA REPAIR  1986    Family History  Problem Relation Age of Onset  . Alzheimer's disease Mother     Social History:  reports that he has never smoked. He has never used smokeless tobacco. He reports that he does not drink alcohol and does not use drugs.  Allergies:  Allergies  Allergen Reactions  . Advair Diskus [Fluticasone-Salmeterol] Palpitations    Medications:  Scheduled:  Continuous: . sodium chloride    . lactated ringers 1,000 mL (02/20/20 0749)    No results found for this or any previous visit (from the past 24 hour(s)).   No results found.  ROS:  As stated above in the HPI otherwise negative.  Blood pressure (!) 170/94, pulse (!) 51, temperature 98.7 F (37.1 C), temperature source Oral, height 5\' 10"  (1.778 m), weight 129.3 kg, SpO2 96 %.    PE: Gen: NAD, Alert and Oriented HEENT:  [place]/AT, EOMI Neck: Supple, no LAD Lungs: CTA Bilaterally CV: Irreg, Irreg ABD: Soft, NTND, +BS Ext: No C/C/E  Assessment/Plan: 1) Screening colonoscopy.  Ricky Burton D 02/20/2020, 8:12 AM

## 2020-02-20 NOTE — Transfer of Care (Signed)
Immediate Anesthesia Transfer of Care Note  Patient: Ricky Burton  Procedure(s) Performed: COLONOSCOPY WITH PROPOFOL (N/A ) BIOPSY  Patient Location: PACU  Anesthesia Type:MAC  Level of Consciousness: awake and alert   Airway & Oxygen Therapy: Patient Spontanous Breathing and Patient connected to face mask oxygen  Post-op Assessment: Report given to RN and Post -op Vital signs reviewed and stable  Post vital signs: Reviewed and stable  Last Vitals:  Vitals Value Taken Time  BP    Temp    Pulse 56 02/20/20 0914  Resp 9 02/20/20 0914  SpO2 100 % 02/20/20 0914  Vitals shown include unvalidated device data.  Last Pain:  Vitals:   02/20/20 0732  TempSrc: Oral  PainSc: 0-No pain         Complications: No complications documented.

## 2020-02-20 NOTE — Anesthesia Procedure Notes (Signed)
Procedure Name: MAC Date/Time: 02/20/2020 8:30 AM Performed by: Cynda Familia, CRNA Pre-anesthesia Checklist: Patient identified, Emergency Drugs available, Suction available, Timeout performed and Patient being monitored Patient Re-evaluated:Patient Re-evaluated prior to induction Oxygen Delivery Method: Simple face mask Placement Confirmation: positive ETCO2 and breath sounds checked- equal and bilateral Dental Injury: Teeth and Oropharynx as per pre-operative assessment

## 2020-02-20 NOTE — Anesthesia Postprocedure Evaluation (Signed)
Anesthesia Post Note  Patient: Ricky Burton  Procedure(s) Performed: COLONOSCOPY WITH PROPOFOL (N/A ) BIOPSY     Patient location during evaluation: Endoscopy Anesthesia Type: MAC Level of consciousness: awake and alert Pain management: pain level controlled Vital Signs Assessment: post-procedure vital signs reviewed and stable Respiratory status: spontaneous breathing, nonlabored ventilation, respiratory function stable and patient connected to nasal cannula oxygen Cardiovascular status: blood pressure returned to baseline and stable Postop Assessment: no apparent nausea or vomiting Anesthetic complications: no   No complications documented.  Last Vitals:  Vitals:   02/20/20 0732 02/20/20 0914  BP: (!) 170/94 (!) 103/55  Pulse: (!) 51 (!) 51  Resp:  19  Temp: 37.1 C 36.6 C  SpO2: 96% 98%    Last Pain:  Vitals:   02/20/20 0914  TempSrc: Oral  PainSc: 0-No pain                 Barnet Glasgow

## 2020-02-20 NOTE — Op Note (Signed)
[facility] Patient Name: Ricky Burton Procedure Date: 02/20/2020 MRN: [id number] Attending MD: Carol Ada , MD Date of Birth: 27-Aug-1960 CSN: [id number] Age: 59 Admit Type: Outpatient Procedure:                Colonoscopy Indications:              Screening for colorectal malignant neoplasm Providers:                Carol Ada, MD, Cleda Daub, RN, Ladona Ridgel, Technician, Lesia Sago, Technician,                            Glenis Smoker, CRNA Referring MD:              Medicines:                Propofol per Anesthesia Complications:            No immediate complications. Estimated Blood Loss:     Estimated blood loss was minimal. Procedure:                Pre-Anesthesia Assessment:                           - Prior to the procedure, a History and Physical                            was performed, and patient medications and                            allergies were reviewed. The patient's tolerance of                            previous anesthesia was also reviewed. The risks                            and benefits of the procedure and the sedation                            options and risks were discussed with the patient.                            All questions were answered, and informed consent                            was obtained. Prior Anticoagulants: The patient has                            taken Coumadin (warfarin), last dose was 4 days                            prior to procedure. ASA Grade Assessment: III - A  patient with severe systemic disease. After                            reviewing the risks and benefits, the patient was                            deemed in satisfactory condition to undergo the                            procedure.                           - Sedation was administered by an anesthesia                            professional. Deep sedation was attained.                            After obtaining informed consent, the colonoscope                            was passed under direct vision. Throughout the                            procedure, the patient's blood pressure, pulse, and                            oxygen saturations were monitored continuously. The                            [id number]-HQ190L ([id number]) Olympus colonoscope was                            introduced through the anus and advanced to the the                            cecum, identified by appendiceal orifice and                            ileocecal valve. The colonoscopy was performed                            without difficulty. The patient tolerated the                            procedure well. The quality of the bowel                            preparation was excellent. The ileocecal valve,                            appendiceal orifice, and rectum were photographed. Scope In: 8:38:35 AM Scope Out: 9:05:46 AM Scope Withdrawal Time: 0 hours 14 minutes 40 seconds  Total Procedure Duration: 0 hours 27 minutes 11 seconds  Findings:  Five sessile polyps were found in the recto-sigmoid colon, sigmoid       colon, transverse colon and cecum. The polyps were 2 to 4 mm in size.       These polyps were removed with a cold snare. Resection and retrieval       were complete.      Scattered small and large-mouthed diverticula were found in the entire       colon. Impression:               - Five 2 to 4 mm polyps at the recto-sigmoid colon,                            in the sigmoid colon, in the transverse colon and                            in the cecum, removed with a cold snare. Resected                            and retrieved.                           - Diverticulosis in the entire examined colon. Moderate Sedation:      Not Applicable - Patient had care per Anesthesia. Recommendation:           - Patient has a contact number available for                             emergencies. The signs and symptoms of potential                            delayed complications were discussed with the                            patient. Return to normal activities tomorrow.                            Written discharge instructions were provided to the                            patient.                           - Resume previous diet.                           - Continue present medications.                           - Await pathology results.                           - Repeat colonoscopy in 3 years for surveillance. Procedure Code(s):        --- Professional ---                           [phone], Colonoscopy, flexible; with  removal of                            tumor(s), polyp(s), or other lesion(s) by snare                            technique Diagnosis Code(s):        --- Professional ---                           Z12.11, Encounter for screening for malignant                            neoplasm of colon                           K63.5, Polyp of colon                           K57.30, Diverticulosis of large intestine without                            perforation or abscess without bleeding CPT copyright 2019 American Medical Association. All rights reserved. The codes documented in this report are preliminary and upon coder review may  be revised to meet current compliance requirements. Carol Ada, MD Carol Ada, MD 02/20/2020 9:13:38 AM This report has been signed electronically. Number of Addenda: 0

## 2020-02-22 ENCOUNTER — Ambulatory Visit: Payer: Medicare Other | Attending: Internal Medicine

## 2020-02-22 DIAGNOSIS — Z23 Encounter for immunization: Secondary | ICD-10-CM

## 2020-02-22 NOTE — Progress Notes (Signed)
   Covid-19 Vaccination Clinic  Name:  DAVEY LIMAS    MRN: [id number] DOB: July 29, 1960  02/22/2020  Mr. Allshouse was observed post Covid-19 immunization for 15 minutes without incident. He was provided with Vaccine Information Sheet and instruction to access the V-Safe system.   Mr. Cerasoli was instructed to call 911 with any severe reactions post vaccine: Marland Kitchen Difficulty breathing  . Swelling of face and throat  . A fast heartbeat  . A bad rash all over body  . Dizziness and weakness   Immunizations Administered    Name Date Dose VIS Date Route   Pfizer COVID-19 Vaccine 02/22/2020  9:24 AM 0.3 mL 01/28/2020 Intramuscular   Manufacturer: [place]   Lot: [id number]   [place]: [id number]

## 2020-02-23 ENCOUNTER — Encounter (HOSPITAL_COMMUNITY): Payer: Self-pay | Admitting: Gastroenterology

## 2020-02-23 LAB — SURGICAL PATHOLOGY

## 2020-06-29 ENCOUNTER — Ambulatory Visit
Admission: RE | Admit: 2020-06-29 | Discharge: 2020-06-29 | Disposition: A | Discharge status: Home / Self Care | Payer: Medicare Other | Source: Ambulatory Visit | Attending: Internal Medicine | Admitting: Internal Medicine

## 2020-06-29 ENCOUNTER — Other Ambulatory Visit: Payer: Self-pay

## 2020-06-29 ENCOUNTER — Other Ambulatory Visit: Payer: Self-pay | Admitting: Internal Medicine

## 2020-06-29 DIAGNOSIS — R059 Cough, unspecified: Secondary | ICD-10-CM

## 2020-08-04 ENCOUNTER — Ambulatory Visit (INDEPENDENT_AMBULATORY_CARE_PROVIDER_SITE_OTHER): Payer: Medicare Other | Admitting: Family Medicine

## 2020-08-04 ENCOUNTER — Encounter: Payer: Self-pay | Admitting: Family Medicine

## 2020-08-04 VITALS — BP 121/73 | HR 74 | Ht 70.0 in | Wt 286.0 lb

## 2020-08-04 DIAGNOSIS — G4733 Obstructive sleep apnea (adult) (pediatric): Secondary | ICD-10-CM

## 2020-08-04 DIAGNOSIS — Z6841 Body Mass Index (BMI) 40.0 and over, adult: Secondary | ICD-10-CM | POA: Diagnosis not present

## 2020-08-04 NOTE — Progress Notes (Signed)
PATIENT: Ricky Burton DOB: October 26, 1960  REASON FOR VISIT: follow up HISTORY FROM: patient  Chief Complaint  Patient presents with  . Follow-up    RM 1 alone Pt is well, BiPAP does help him sleep. He is doing good.      HISTORY OF PRESENT ILLNESS: 08/04/20 ALL: Ricky Burton is a 60 y.o. male here today for follow up for OSA on BiPAP. He reports that he is feeling well. He is sleeping well. He wakes feeling refreshed. He denies concerns with machine or supplies.   Compliance data from 02/29/2020-03/29/2020 shows he used BiPAP 30/30 days with > 4 hour usage at 96.7%. residual AHI was 2 with IPAP of 12cmH20 and EPAP of 8cmH20.  No significant leak noted.    HISTORY: (copied from Dr Dohmeier's previous notes)  HPI: 08-04-2019: Ricky Burton  is a 60 y.o. AA male with history of hypertension with CKD stage III, hyperlipidemia, morbid obesity with obstructive sleep apnea on BiPAP, and history of CVA in 2001. He underwent BiPAP titration in 05-2016 and remained on the same settings.  Also has history of atrial flutter status post ablation in 2003 in [place], Paroxysmal A. Fib and on Coumadin. This is managed by his PCP.  Fortunately since last office visit he is no clinical chest pain.  States that he is doing well.  Dyspnea is remained stable.  Ricky Burton reports BiPAP titration had shown the greatest benefit of the setting of 12/8 cmH2O and he has been doing very well on the current setting his residual AHI is 4.1 he is 800% compliant patient and he would reports that he feels his sleep is so much better he can hardly sleep without the BiPAP well.  Average use at time is 6 hours 26 minutes each night he has very little air leak the average time per night in air leakage is 18 seconds.  So there is no problem with the mask fit.  Humidifier setting is currently on level 4 ramp time is only 5 minutes and he has a 2 cm by flex setting.  From these results I do not think we need to change  anything if the patient would feel that he is short of breath at night and that he has swallowed air on a regular basis I would have to adjust the inspiratory pressure at this time I think we can leave it where it is.  Patient recieved both COVID shots, is 14 days post second dose now. Pfizer   Ricky Burton is a 61 y.o. male , seen last on 06-01-2016, as a referral  from Dr. Ludwig Clarks for  Re-establishing sleep care. I had seen Ricky Burton about 8 years prior for the last time and at the time had placed him on ASV BiPAP which he continues to use. In the meantime he did have several new health problems including paroxysmal atrial fibrillation with long-term use of anticoagulants, pain, hypertensive heart disease without heart failure, gout,  Still has superobesity, hypercholesterolemia, and he was admitted in late January until early February  2018 with sepsis. He developed again atrial flutter this time and the cardioversion did not take hold. He has now a higher dose of flecainide to take and states that this has controlled his heart rate. Ricky Burton also has been talking to his primary care physician about significant weight loss and received information about dietary and nutritional approaches to weight loss, and exercise regimen and the remaining surgical options.  There is a new clinic with an [place] led by Dr. Leafy Ro and located within [facility] [place] on  [address] that is specialized in  bariatric medicinal/  nonsurgical option. I would like to add that as the patient provided a data download for his current CPAP, he is actually using BiPAP setting of 17/12 cm water with easy breathing function has used it for 97% of the last 30 days and is considered highly.compliant, with the average user time of 7 hours at night and a residual AHI of only 2.3.  His primary care physician provided a copy of his last sleep study, based on complaints of witnessed apneas, loud snoring, nocturia and dry  mouth. 2008 his AHI was 80 and he was titrated to 14 cm water on CPAP but gained further weight, about 60 pounds in the following 6 years. By 2013 his pressure needs changed and his Epworth sleepiness score was endorsed at 18 points. He used BiPAP therapy with a residual AHI at 17 per hour. He was changed to a ASV BiPAP machine.  Chief complaint according to patient : feeling tired again, but is using BiPAP   Sleep habits are as follows: The patient usually goes to bed at midnight, and falls asleep promptly by using his BiPAP. He stays asleep for about 4-1/2 hours before he has his first bathroom break he just wakes up spontaneously. After that he has trouble to reinitiate initiate sleep. He starts to read when he wakes up early but that may arise at about 5:30. The reading has not helped him to find sleep. He has only one bathroom break at night and usually he is awake when he goes it is not the urge to urinate that wakes him out of sleep. He does not wake with headaches, palpitations, is not diaphoretic or nauseated.  He does not feel that he is completely rested nor restored but he struggles to find more sleep. His bedroom is cool, quiet and dark. He shares a bedroom with his wife. She has not noticed any new apneas of breakthrough snoring while using his device. Has witnessed air leaks, sometimes loud noises. The water reservoir leaks and the door doesn't close. He may ned a new machine. He was initially placed on positive airway pressure with the addition of oxygen being bled into the machine, but in the meantime he has sometimes traveled without taking the oxygen concentrator or tank with him and felt not different. In the hospital he was not placed on oxygen either. This indicates that oxygen is probably not needed any longer.    Sleep medical history and family sleep history:  Adopted, one daughter , who is healthy , 2 years old.   Social history: Married, one adult daughter, nonsmoker,  nondrinker, caffeine use is limited on coffee a day, no soda and no  ice tea. 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   REVIEW OF SYSTEMS: Out of a complete 14 system review of symptoms, the patient complains only of the following symptoms, none and all other reviewed systems are negative.   ALLERGIES: Allergies  Allergen Reactions  . Advair Diskus [Fluticasone-Salmeterol] Palpitations    HOME MEDICATIONS: Outpatient Medications Prior to Visit  Medication Sig Dispense Refill  . allopurinol (ZYLOPRIM) 100 MG tablet Take 100 mg by mouth daily.    . BELSOMRA 20 MG TABS Take 20 mg by mouth  at bedtime as needed (Sleep).     . Cholecalciferol (VITAMIN D3) 50 MCG (2000 UT) TABS Take 2,000 Units by mouth daily.    . flecainide (TAMBOCOR) 100 MG tablet Take 100 mg by mouth See admin instructions. Take 100 mg in AM, 150 mg in PM    . PARoxetine (PAXIL) 20 MG tablet Take 10 mg by mouth daily.     . rosuvastatin (CRESTOR) 10 MG tablet Take 10 mg by mouth at bedtime.     Marland Kitchen telmisartan (MICARDIS) 80 MG tablet Take 80 mg by mouth at bedtime.     . verapamil (COVERA HS) 180 MG (CO) 24 hr tablet Take 180 mg by mouth daily.    Marland Kitchen warfarin (COUMADIN) 5 MG tablet Take 5 mg by mouth at bedtime.      No facility-administered medications prior to visit.    PAST MEDICAL HISTORY: Past Medical History:  Diagnosis Date  . A-fib ([place])   . Anxiety   . Heart disease   . High cholesterol    controlled  . Hypertension   . Morbid obesity ([place])   . OSA (obstructive sleep apnea)   . Stroke ([place])    L sided in 2001    PAST SURGICAL HISTORY: Past Surgical History:  Procedure Laterality Date  . COLONOSCOPY WITH PROPOFOL N/A 02/20/2020   Procedure: COLONOSCOPY WITH PROPOFOL;  Surgeon: Carol Ada, MD;  Location: [facility];  Service: Endoscopy;  Laterality: N/A;  . HERNIA REPAIR  1986  . POLYPECTOMY  02/20/2020   Procedure:  POLYPECTOMY;  Surgeon: Carol Ada, MD;  Location: [facility];  Service: Endoscopy;;    FAMILY HISTORY: Family History  Problem Relation Age of Onset  . Alzheimer's disease Mother     SOCIAL HISTORY: Social History   Socioeconomic History  . Marital status: Married    Spouse name: Ricky Burton  . Number of children: 1  . Years of education: HS  . Highest education level: Not on file  Occupational History  . Occupation: Retired   Tobacco Use  . Smoking status: Never Smoker  . Smokeless tobacco: Never Used  Vaping Use  . Vaping Use: Never used  Substance and Sexual Activity  . Alcohol use: No  . Drug use: No  . Sexual activity: Not on file  Other Topics Concern  . Not on file  Social History Narrative   Patient is married Garment/textile technologist) and lives at home with his wife.   Patient has two children.   Patient is right-handed.   Patient drinks one cup of coffee daily.      Social Determinants of Health   Financial Resource Strain: Not on file  Food Insecurity: Not on file  Transportation Needs: Not on file  Physical Activity: Not on file  Stress: Not on file  Social Connections: Not on file  Intimate Partner Violence: Not on file     PHYSICAL EXAM  Vitals:   08/04/20 0848  BP: 121/73  Pulse: 74  Weight: 286 lb (129.7 kg)  Height: 5\' 10"  (1.778 m)   Body mass index is 41.04 kg/m.  Generalized: Well developed, in no acute distress  Cardiology: normal rate and rhythm, no murmur noted Respiratory: clear to auscultation bilaterally  Neurological examination  Mentation: Alert oriented to time, place, history taking. Follows all commands speech and language fluent Cranial nerve II-XII: Pupils were equal round reactive to light. Extraocular movements were full, visual field were full  Motor: The motor testing reveals 5 over 5 strength of all  4 extremities. Good symmetric motor tone is noted throughout.  Gait and station: Gait is normal.    DIAGNOSTIC DATA (LABS,  IMAGING, TESTING) - I reviewed patient records, labs, notes, testing and imaging myself where available.  No flowsheet data found.   Lab Results  Component Value Date   WBC 6.7 05/10/2016   HGB 14.3 05/10/2016   HCT 42.9 05/10/2016   MCV 96.4 05/10/2016   PLT 122 (L) 05/10/2016      Component Value Date/Time   NA 137 05/10/2016 0830   K 3.9 05/10/2016 0830   CL 108 05/10/2016 0830   CO2 21 (L) 05/10/2016 0830   GLUCOSE 97 05/10/2016 0830   BUN 16 05/10/2016 0830   CREATININE 1.70 (H) 05/10/2016 0830   CALCIUM 8.2 (L) 05/10/2016 0830   PROT 6.4 (L) 05/10/2016 0830   ALBUMIN 2.5 (L) 05/10/2016 0830   AST 47 (H) 05/10/2016 0830   ALT 45 05/10/2016 0830   ALKPHOS 74 05/10/2016 0830   BILITOT 1.5 (H) 05/10/2016 0830   GFRNONAA 43 (L) 05/10/2016 0830   GFRAA 50 (L) 05/10/2016 0830   No results found for: CHOL, HDL, LDLCALC, LDLDIRECT, TRIG, CHOLHDL No results found for: HGBA1C No results found for: VITAMINB12 No results found for: TSH   ASSESSMENT AND PLAN 60 y.o. year old male  has a past medical history of A-fib ([place]), Anxiety, Heart disease, High cholesterol, Hypertension, Morbid obesity ([place]), OSA (obstructive sleep apnea), and Stroke ([place]). here with     ICD-10-CM   1. Class 3 severe obesity due to excess calories with serious comorbidity and body mass index (BMI) of 40.0 to 44.9 in adult ([place])  E66.01    Z68.41   2. OSA treated with BiPAP  G47.33 For home use only DME Bipap     [place] C Mcglory is doing well on BiPAP therapy. Compliance report reveals excellent compliance from last download dated 02/29/2020 through 03/29/2020. He was advised to bring his machine with power cord back to office to download most recent data. He was encouraged to continue using BiPAP nightly and for greater than 4 hours each night. We will update supply orders as indicated. Risks of untreated sleep apnea review and education materials provided. Healthy lifestyle habits encouraged. He will  follow up in 1 year,  sooner if needed. He verbalizes understanding and agreement with this plan.    Orders Placed This Encounter  Procedures  . For home use only DME Bipap    Supplies    Order Specific Question:   Length of Need    Answer:   Lifetime    Order Specific Question:   Inspiratory pressure    Answer:   OTHER SEE COMMENTS    Order Specific Question:   Expiratory pressure    Answer:   OTHER SEE COMMENTS     No orders of the defined types were placed in this encounter.     I spent 15 minutes with the patient. 50% of this time was spent counseling and educating patient on plan of care and medications.    Debbora Presto, FNP-C 08/04/2020, 8:58 AM [facility] [address] [place],  [address] [phone]

## 2020-08-04 NOTE — Patient Instructions (Signed)
Please continue using your BiPAP regularly. While your insurance requires that you use BiPAP at least 4 hours each night on 70% of the nights, I recommend, that you not skip any nights and use it throughout the night if you can. Getting used to BiPAP and staying with the treatment long term does take time and patience and discipline. Untreated obstructive sleep apnea when it is moderate to severe can have an adverse impact on cardiovascular health and raise her risk for heart disease, arrhythmias, hypertension, congestive heart failure, stroke and diabetes. Untreated obstructive sleep apnea causes sleep disruption, nonrestorative sleep, and sleep deprivation. This can have an impact on your day to day functioning and cause daytime sleepiness and impairment of cognitive function, memory loss, mood disturbance, and problems focussing. Using BiPAP regularly can improve these symptoms.   Follow up in 1 year    Sleep Apnea Sleep apnea affects breathing during sleep. It causes breathing to stop for a short time or to become shallow. It can also increase the risk of:  Heart attack.  Stroke.  Being very overweight (obese).  Diabetes.  Heart failure.  Irregular heartbeat. The goal of treatment is to help you breathe normally again. What are the causes? There are three kinds of sleep apnea:  Obstructive sleep apnea. This is caused by a blocked or collapsed airway.  Central sleep apnea. This happens when the brain does not send the right signals to the muscles that control breathing.  Mixed sleep apnea. This is a combination of obstructive and central sleep apnea. The most common cause of this condition is a collapsed or blocked airway. This can happen if:  Your throat muscles are too relaxed.  Your tongue and tonsils are too large.  You are overweight.  Your airway is too small.   What increases the risk?  Being overweight.  Smoking.  Having a small airway.  Being  older.  Being male.  Drinking alcohol.  Taking medicines to calm yourself (sedatives or tranquilizers).  Having family members with the condition. What are the signs or symptoms?  Trouble staying asleep.  Being sleepy or tired during the day.  Getting angry a lot.  Loud snoring.  Headaches in the morning.  Not being able to focus your mind (concentrate).  Forgetting things.  Less interest in sex.  Mood swings.  Personality changes.  Feelings of sadness (depression).  Waking up a lot during the night to pee (urinate).  Dry mouth.  Sore throat. How is this diagnosed?  Your medical history.  A physical exam.  A test that is done when you are sleeping (sleep study). The test is most often done in a sleep lab but may also be done at home. How is this treated?  Sleeping on your side.  Using a medicine to get rid of mucus in your nose (decongestant).  Avoiding the use of alcohol, medicines to help you relax, or certain pain medicines (narcotics).  Losing weight, if needed.  Changing your diet.  Not smoking.  Using a machine to open your airway while you sleep, such as: ? An oral appliance. This is a mouthpiece that shifts your lower jaw forward. ? A CPAP device. This device blows air through a mask when you breathe out (exhale). ? An EPAP device. This has valves that you put in each nostril. ? A BPAP device. This device blows air through a mask when you breathe in (inhale) and breathe out.  Having surgery if other treatments do not work.  is important to get treatment for sleep apnea. Without treatment, it can lead to:  High blood pressure.  Coronary artery disease.  In men, not being able to have an erection (impotence).  Reduced thinking ability.   Follow these instructions at home: Lifestyle  Make changes that your doctor recommends.  Eat a healthy diet.  Lose weight if needed.  Avoid alcohol, medicines to help you relax, and some pain  medicines.  Do not use any products that contain nicotine or tobacco, such as cigarettes, e-cigarettes, and chewing tobacco. If you need help quitting, ask your doctor. General instructions  Take over-the-counter and prescription medicines only as told by your doctor.  If you were given a machine to use while you sleep, use it only as told by your doctor.  If you are having surgery, make sure to tell your doctor you have sleep apnea. You may need to bring your device with you.  Keep all follow-up visits as told by your doctor. This is important. Contact a doctor if:  The machine that you were given to use during sleep bothers you or does not seem to be working.  You do not get better.  You get worse. Get help right away if:  Your chest hurts.  You have trouble breathing in enough air.  You have an uncomfortable feeling in your back, arms, or stomach.  You have trouble talking.  One side of your body feels weak.  A part of your face is hanging down. These symptoms may be an emergency. Do not wait to see if the symptoms will go away. Get medical help right away. Call your local emergency services (911 in the [place] Do not drive yourself to the hospital. Summary  This condition affects breathing during sleep.  The most common cause is a collapsed or blocked airway.  The goal of treatment is to help you breathe normally while you sleep. This information is not intended to replace advice given to you by your health care provider. Make sure you discuss any questions you have with your health care provider. Document Revised: 01/11/2018 Document Reviewed: 11/20/2017 Elsevier Patient Education  2021 [employer].  

## 2020-08-04 NOTE — Progress Notes (Signed)
CM sent to [facility] 

## 2020-09-02 ENCOUNTER — Ambulatory Visit
Admission: RE | Admit: 2020-09-02 | Discharge: 2020-09-02 | Disposition: A | Discharge status: Home / Self Care | Payer: Medicare Other | Source: Ambulatory Visit | Attending: Internal Medicine | Admitting: Internal Medicine

## 2020-09-02 ENCOUNTER — Other Ambulatory Visit: Payer: Self-pay | Admitting: Internal Medicine

## 2020-09-02 ENCOUNTER — Other Ambulatory Visit: Payer: Self-pay

## 2020-09-02 DIAGNOSIS — T1490XA Injury, unspecified, initial encounter: Secondary | ICD-10-CM

## 2021-01-11 ENCOUNTER — Ambulatory Visit: Payer: Medicare Other | Admitting: Student

## 2021-01-12 NOTE — Progress Notes (Signed)
Primary Physician/Referring:  Jilda Panda, MD  Patient ID: Ricky Burton, male    DOB: April 22, 1960, 60 y.o.   MRN: [id number]  Chief Complaint  Patient presents with   Paroxysmal atrial fibrillation    Hypertension   Follow-up    1 year   HPI:    HPI: Ricky Burton  is a 60 y.o. AA male with history of hypertension with CKD stage III, hyperlipidemia, morbid obesity with obstructive sleep apnea on BiPAP, and history of CVA in 2001. Also has history of atrial flutter status post ablation in 2003 in [place], Paroxysmal A. Fib and on Coumadin. This is managed by his PCP.     Patient presents for annual follow-up of paroxysmal atrial fibrillation, hypertension, hyperlipidemia.  Last office visit patient was stable with well-controlled lipids and blood pressure, therefore no changes were made at last office visit.  Patient is feeling well and remains asymptomatic.  Denies chest pain, dyspnea, dizziness, syncope, near syncope.  He is tolerating anticoagulation without bleeding diathesis.  He continues to use his BiPAP nightly and is exercising about 5 days/week for approximately 1 hour.  He is also playing golf frequently.   Past Medical History:  Diagnosis Date   A-fib ([place])    Anxiety    Heart disease    High cholesterol    controlled   Hypertension    Morbid obesity ([place])    OSA (obstructive sleep apnea)    Stroke ([place])    L sided in 2001   Past Surgical History:  Procedure Laterality Date   COLONOSCOPY WITH PROPOFOL N/A 02/20/2020   Procedure: COLONOSCOPY WITH PROPOFOL;  Surgeon: Carol Ada, MD;  Location: [facility];  Service: Endoscopy;  Laterality: N/A;   HERNIA REPAIR  1986   POLYPECTOMY  02/20/2020   Procedure: POLYPECTOMY;  Surgeon: Carol Ada, MD;  Location: [facility];  Service: Endoscopy;;   Family History  Problem Relation Age of Onset   Alzheimer's disease Mother    Social History   Tobacco Use   Smoking status: Never   Smokeless tobacco: Never   Substance Use Topics   Alcohol use: No  Marital Status: Married  ROS  Review of Systems  Constitutional: Negative for malaise/fatigue and weight gain.  Cardiovascular:  Negative for chest pain, claudication, leg swelling, near-syncope, orthopnea, palpitations, paroxysmal nocturnal dyspnea and syncope.  Respiratory:  Positive for sleep disturbances due to breathing (on BiPAP). Negative for shortness of breath.   Neurological:  Negative for dizziness.  Objective  Blood pressure 131/80, pulse (!) 54, temperature 98 F (36.7 C), resp. rate 16, height 5' 10" (1.778 m), weight 283 lb (128.4 kg), SpO2 95 %. Body mass index is 40.61 kg/m.   Physical Exam Vitals reviewed.  Constitutional:      Appearance: He is well-developed.     Comments: Morbidly obese  Neck:     Thyroid: No thyromegaly.     Comments: Short neck and difficult to evaluate JVP Cardiovascular:     Rate and Rhythm: Normal rate and regular rhythm.     Pulses:          Carotid pulses are 2+ on the right side and 2+ on the left side.      Dorsalis pedis pulses are 2+ on the right side and 2+ on the left side.       Posterior tibial pulses are 2+ on the right side and 2+ on the left side.     Heart sounds: Normal heart sounds.  No murmur heard.   No gallop.     Comments: Femoral and popliteal pulse difficult to feel due to patient's body habitus.  Pulmonary:     Effort: Pulmonary effort is normal.     Breath sounds: Normal breath sounds.  Abdominal:     Comments: Obese. Pannus present  Musculoskeletal:     Right lower leg: No edema.     Left lower leg: No edema.  Skin:    General: Skin is warm and dry.  Neurological:     Mental Status: He is alert.   Laboratory examination:   CMP Latest Ref Rng & Units 05/10/2016 05/10/2016 05/09/2016  Glucose 65 - 99 mg/dL 97 100(H) 118(H)  BUN 6 - 20 mg/dL _0 Creatinine 0.61 - 1.24 mg/dL 1.70(H) 1.77(H) 1.78(H)  Sodium 135 - 145 mmol/L 137 138 140  Potassium 3.5 - 5.1  mmol/L 3.9 4.0 4.1  Chloride 101 - 111 mmol/L 108 109 107  CO2 22 - 32 mmol/L 21(L) 22 24  Calcium 8.9 - 10.3 mg/dL 8.2(L) 8.2(L) 8.7(L)  Total Protein 6.5 - 8.1 g/dL 6.4(L) - -  Total Bilirubin 0.3 - 1.2 mg/dL 1.5(H) - -  Alkaline Phos 38 - 126 U/L 74 - -  AST 15 - 41 U/L 47(H) - -  ALT 17 - 63 U/L 45 - -   CBC Latest Ref Rng & Units 05/10/2016 05/09/2016 05/08/2016  WBC 4.0 - 10.5 K/uL 6.7 11.0(H) 19.5(H)  Hemoglobin 13.0 - 17.0 g/dL 14.3 16.4 13.8  Hematocrit 39.0 - 52.0 % 42.9 50.0 42.2  Platelets 150 - 400 K/uL 122(L) 122(L) 117(L)   Lipid Panel  No results found for: CHOL, TRIG, HDL, CHOLHDL, VLDL, LDLCALC, LDLDIRECT HEMOGLOBIN A1C No results found for: HGBA1C, MPG TSH No results for input(s): TSH in the last 8760 hours.   External Labs:  12/25/2019: Total cholesterol 120, triglycerides 56, HDL 53, LDL 56 Hemoglobin 15.5, hematocrit 47.4, CBC otherwise within normal limits PT 22.9, INR 2.1 Sodium 144, potassium 5.1, BUN 21, creatinine 1.3, GFR 66, CMP otherwise within normal limits Vit D 24.8  12/03/2018: HB 15.6/HCT 47.9, platelets 151, INR 2.2.  Total cholesterol 114, triglycerides 65, HDL 50, LDL 51.  BUN 21, creatinine 1.43, CMP otherwise normal.  07/12/2017: Potassium 4.8, creatinine 1.9, CMP otherwise normal.  Cholesterol 94, triglycerides 55, HDL 42, LDL 41. Vitamin D 31.3.  06/20/2016: CBC normal. Potassium 4.4, entire CMP not available for review.  05/24/2016: Creatinine 1.5, EGFR 57, sodium 133, potassium 4.6, BMP normal.  Allergies   Allergies  Allergen Reactions   Advair Diskus [Fluticasone-Salmeterol] Palpitations    Medications Prior to Visit:   Outpatient Medications Prior to Visit  Medication Sig Dispense Refill   allopurinol (ZYLOPRIM) 100 MG tablet Take 100 mg by mouth daily.     BELSOMRA 20 MG TABS Take 20 mg by mouth at bedtime as needed (Sleep).      Cholecalciferol (VITAMIN D3) 50 MCG (2000 UT) TABS Take 2,000 Units by mouth daily.      flecainide (TAMBOCOR) 100 MG tablet Take 100 mg by mouth See admin instructions. Take 100 mg in AM, 150 mg in PM     PARoxetine (PAXIL) 20 MG tablet Take 10 mg by mouth daily.      rosuvastatin (CRESTOR) 10 MG tablet Take 10 mg by mouth at bedtime.      telmisartan (MICARDIS) 80 MG tablet Take 80 mg by mouth at bedtime.      verapamil (COVERA HS) 180  MG (CO) 24 hr tablet Take 180 mg by mouth daily.     warfarin (COUMADIN) 5 MG tablet Take 5 mg by mouth at bedtime.      No facility-administered medications prior to visit.   Final Medications at End of Visit    Current Meds  Medication Sig   allopurinol (ZYLOPRIM) 100 MG tablet Take 100 mg by mouth daily.   BELSOMRA 20 MG TABS Take 20 mg by mouth at bedtime as needed (Sleep).    Cholecalciferol (VITAMIN D3) 50 MCG (2000 UT) TABS Take 2,000 Units by mouth daily.   flecainide (TAMBOCOR) 100 MG tablet Take 100 mg by mouth See admin instructions. Take 100 mg in AM, 150 mg in PM   PARoxetine (PAXIL) 20 MG tablet Take 10 mg by mouth daily.    rosuvastatin (CRESTOR) 10 MG tablet Take 10 mg by mouth at bedtime.    telmisartan (MICARDIS) 80 MG tablet Take 80 mg by mouth at bedtime.    verapamil (COVERA HS) 180 MG (CO) 24 hr tablet Take 180 mg by mouth daily.   warfarin (COUMADIN) 5 MG tablet Take 5 mg by mouth at bedtime.    Radiology:    No results found.  Cardiac Studies:   Lexiscan Myoview Stress Test 12/09/2018: 1. Stress EKG is non-diagnostic, as this is pharmacological stress test. 2. The perfusion imaging study demonstrates soft tissue attenuation artifact in the inferior wall. There is no demonstrable ischemia or scar. All segments of left ventricle demonstrated normal wall motion and thickening. Stress LV EF is mildly dysfunctional 48% although visually appears normal. This is a low risk study. Clinical correlation recommended in the patient's with a BMI of 41. 3. Compared to the report from 02/18/2010, no significant change.   Previously EF reported to be normal.   Echocardiogram 12/10/2018: Left ventricle cavity is normal in size. Moderate concentric hypertrophy of the left ventricle. Normal LV systolic function with EF 55%. Normal global wall motion. Indeterminate diastolic filling pattern.  Left atrial cavity is mildly dilated. No hemodynamically significant valvular abnormality. Normal right atrial pressure.  No significant change compared to previous study in 2018.   EKG:  01/13/2021: Sinus bradycardia with first-degree AV block at a rate of 52 bpm.  Left atrial enlargement.  Normal axis.  Low voltage complexes.  Nonspecific T wave abnormality.  Compared to EKG 01/12/2020, no significant change.   Assessment     ICD-10-CM   1. Paroxysmal atrial fibrillation (HCC)  I48.0 EKG 12-Lead    2. Essential hypertension  I10     3. Stage 3a chronic kidney disease (HCC)  N18.31       No orders of the defined types were placed in this encounter.  There are no discontinued medications.   Recommendations:   Ricky Burton  is a 60 y.o. AA male with history of hypertension with CKD stage III, hyperlipidemia, morbid obesity with obstructive sleep apnea on BiPAP, and history of CVA in 2001. Also has history of atrial flutter status post ablation in 2003 in [place], Paroxysmal A. Fib and on Coumadin. This is managed by his PCP.     Patient presents for annual follow-up of paroxysmal atrial fibrillation, hypertension, hyperlipidemia.  Last office visit patient was stable with well-controlled lipids and blood pressure, therefore no changes were made at last office visit.  Patient continues to feel well and is asymptomatic from a cardiovascular standpoint.  Blood pressure is well controlled and he remains active exercising regularly without issue.  I have requested recent PCP labs for review, however at last check lipids were well controlled and renal function stable, will defer further management of hyperlipidemia to PCP.    Patient remained stable from a cardiovascular standpoint.  He is tolerating anticoagulation without bleeding diathesis.  Follow-up in 1 year, sooner if needed, for paroxysmal atrial fibrillation, hypertension, hyperlipidemia.   Alethia Berthold, PA-C 01/13/2021, 9:34 AM Office: [phone]

## 2021-01-13 ENCOUNTER — Other Ambulatory Visit: Payer: Self-pay

## 2021-01-13 ENCOUNTER — Encounter: Payer: Self-pay | Admitting: Student

## 2021-01-13 ENCOUNTER — Ambulatory Visit: Payer: Medicare Other | Admitting: Student

## 2021-01-13 VITALS — BP 131/80 | HR 54 | Temp 98.0°F | Resp 16 | Ht 70.0 in | Wt 283.0 lb

## 2021-01-13 DIAGNOSIS — I10 Essential (primary) hypertension: Secondary | ICD-10-CM

## 2021-01-13 DIAGNOSIS — N1831 Chronic kidney disease, stage 3a: Secondary | ICD-10-CM

## 2021-01-13 DIAGNOSIS — I480 Paroxysmal atrial fibrillation: Secondary | ICD-10-CM

## 2021-01-14 NOTE — Progress Notes (Signed)
External labs 12/03/2020: Sodium 141, potassium 4.4, glucose 86, BUN 19, creatinine 1.35, ALT 24, AST 27, GFR >60 Total cholesterol 126, triglycerides 70, HDL 53, LDL 59 Hgb 15.6, HCT 46.4, MCV 97.7, platelet 193

## 2021-06-30 NOTE — Progress Notes (Signed)
External labs 06/23/2021: ?Total cholesterol 112, HDL 51, LDL 47, triglycerides 62 ?BUN 19, creatinine 1.6, GFR 48, sodium 142, potassium 4.4 ?INR 2.2, PT 21.5, ?Hgb 15.3, HCT 45.3, MCV 90.7, platelet 173

## 2021-08-03 NOTE — Patient Instructions (Incomplete)
Please continue using your BiPAP regularly. While your insurance requires that you use BiPAP at least 4 hours each night on 70% of the nights, I recommend, that you not skip any nights and use it throughout the night if you can. Getting used to BiPAP and staying with the treatment long term does take time and patience and discipline. Untreated obstructive sleep apnea when it is moderate to severe can have an adverse impact on cardiovascular health and raise her risk for heart disease, arrhythmias, hypertension, congestive heart failure, stroke and diabetes. Untreated obstructive sleep apnea causes sleep disruption, nonrestorative sleep, and sleep deprivation. This can have an impact on your day to day functioning and cause daytime sleepiness and impairment of cognitive function, memory loss, mood disturbance, and problems focussing. Using BiPAP regularly can improve these symptoms.   

## 2021-08-03 NOTE — Progress Notes (Deleted)
PATIENT: Ricky Burton DOB: [birth year]  REASON FOR VISIT: follow up HISTORY FROM: patient  No chief complaint on file.    HISTORY OF PRESENT ILLNESS:  08/03/21 [name] returns for follow up for OSA on BiPAP.   08/04/2020 ALL:  Ricky Burton is a 62 y.o. male here today for follow up for OSA on BiPAP. He reports that he is feeling well. He is sleeping well. He wakes feeling refreshed. He denies concerns with machine or supplies.   Compliance data from 02/29/2020-03/29/2020 shows he used BiPAP 30/30 days with > 4 hour usage at 96.7%. residual AHI was 2 with IPAP of 12cmH20 and EPAP of 8cmH20.  No significant leak noted.    HISTORY: (copied from Dr Dohmeier's previous notes)  HPI: 08-04-2019: Ricky Burton  is a 61 y.o. AA male with history of hypertension with CKD stage III, hyperlipidemia, morbid obesity with obstructive sleep apnea on BiPAP, and history of CVA in 2001. He underwent BiPAP titration in 05-2016 and remained on the same settings.  Also has history of atrial flutter status post ablation in 2003 in [place], Paroxysmal A. Fib and on Coumadin. This is managed by his PCP.    Fortunately since last office visit he is no clinical chest pain.  States that he is doing well.  Dyspnea is remained stable.  [name] reports BiPAP titration had shown the greatest benefit of the setting of 12/8 cmH2O and he has been doing very well on the current setting his residual AHI is 4.1 he is 800% compliant patient and he would reports that he feels his sleep is so much better he can hardly sleep without the BiPAP well.  Average use at time is 6 hours 26 minutes each night he has very little air leak the average time per night in air leakage is 18 seconds.  So there is no problem with the mask fit.  Humidifier setting is currently on level 4 ramp time is only 5 minutes and he has a 2 cm by flex setting.  From these results I do not think we need to change anything if the patient would  feel that he is short of breath at night and that he has swallowed air on a regular basis I would have to adjust the inspiratory pressure at this time I think we can leave it where it is.   Patient recieved both COVID shots, is 14 days post second dose now. Pfizer    Ricky Burton is a 61 y.o. male , seen last on 06-01-2016, as a referral  from Dr. Mellody Drown for  Re-establishing sleep care. I had seen [name] about 8 years prior for the last time and at the time had placed him on ASV BiPAP which he continues to use. In the meantime he did have several new health problems including paroxysmal atrial fibrillation with long-term use of anticoagulants, pain, hypertensive heart disease without heart failure, gout,  Still has superobesity, hypercholesterolemia, and he was admitted in late January until early February  2018 with sepsis. He developed again atrial flutter this time and the cardioversion did not take hold. He has now a higher dose of flecainide to take and states that this has controlled his heart rate. Mr. buffet also has been talking to his primary care physician about significant weight loss and received information about dietary and nutritional approaches to weight loss, and exercise regimen and the remaining surgical options. There is a new clinic  with an [facility] led by Dr. Leafy Ro and located within the [facility] [place] on  [address] that is specialized in  bariatric medicinal/  nonsurgical option. I would like to add that as the patient provided a data download for his current CPAP, he is actually using BiPAP setting of 17/12 cm water with easy breathing function has used it for 97% of the last 30 days and is considered highly.compliant, with the average user time of 7 hours at night and a residual AHI of only 2.3.   His primary care physician provided a copy of his last sleep study, based on complaints of witnessed apneas, loud snoring, nocturia and dry mouth. 2008 his AHI was 80 and  he was titrated to 14 cm water on CPAP but gained further weight, about 60 pounds in the following 6 years. By 2013 his pressure needs changed and his Epworth sleepiness score was endorsed at 18 points. He used BiPAP therapy with a residual AHI at 17 per hour. He was changed to a ASV BiPAP machine.   Chief complaint according to patient : feeling tired again, but is using BiPAP    Sleep habits are as follows: The patient usually goes to bed at midnight, and falls asleep promptly by using his BiPAP. He stays asleep for about 4-1/2 hours before he has his first bathroom break he just wakes up spontaneously. After that he has trouble to reinitiate initiate sleep. He starts to read when he wakes up early but that may arise at about 5:30. The reading has not helped him to find sleep. He has only one bathroom break at night and usually he is awake when he goes it is not the urge to urinate that wakes him out of sleep. He does not wake with headaches, palpitations, is not diaphoretic or nauseated.   He does not feel that he is completely rested nor restored but he struggles to find more sleep. His bedroom is cool, quiet and dark. He shares a bedroom with his wife. She has not noticed any new apneas of breakthrough snoring while using his device. Has witnessed air leaks, sometimes loud noises. The water reservoir leaks and the door doesn't close. He may ned a new machine. He was initially placed on positive airway pressure with the addition of oxygen being bled into the machine, but in the meantime he has sometimes traveled without taking the oxygen concentrator or tank with him and felt not different. In the hospital he was not placed on oxygen either. This indicates that oxygen is probably not needed any longer.     Sleep medical history and family sleep history:  Adopted, one daughter , who is healthy , 55 years old.    Social history: Married, one adult daughter, nonsmoker, nondrinker, caffeine use is  limited on coffee a day, no soda and no  ice tea. 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   REVIEW OF SYSTEMS: Out of a complete 14 system review of symptoms, the patient complains only of the following symptoms, none and all other reviewed systems are negative.   ALLERGIES: Allergies  Allergen Reactions   Advair Diskus [Fluticasone-Salmeterol] Palpitations    HOME MEDICATIONS: Outpatient Medications Prior to Visit  Medication Sig Dispense Refill   allopurinol (ZYLOPRIM) 100 MG tablet Take 100 mg by mouth daily.     BELSOMRA 20 MG TABS Take 20 mg by  mouth at bedtime as needed (Sleep).      Cholecalciferol (VITAMIN D3) 50 MCG (2000 UT) TABS Take 2,000 Units by mouth daily.     flecainide (TAMBOCOR) 100 MG tablet Take 100 mg by mouth See admin instructions. Take 100 mg in AM, 150 mg in PM     PARoxetine (PAXIL) 20 MG tablet Take 10 mg by mouth daily.      rosuvastatin (CRESTOR) 10 MG tablet Take 10 mg by mouth at bedtime.      telmisartan (MICARDIS) 80 MG tablet Take 80 mg by mouth at bedtime.      verapamil (COVERA HS) 180 MG (CO) 24 hr tablet Take 180 mg by mouth daily.     warfarin (COUMADIN) 5 MG tablet Take 5 mg by mouth at bedtime.      No facility-administered medications prior to visit.    PAST MEDICAL HISTORY: Past Medical History:  Diagnosis Date   A-fib ([place])    Anxiety    Heart disease    High cholesterol    controlled   Hypertension    Morbid obesity ([place])    OSA (obstructive sleep apnea)    Stroke ([place])    L sided in 2001    PAST SURGICAL HISTORY: Past Surgical History:  Procedure Laterality Date   COLONOSCOPY WITH PROPOFOL N/A 02/20/2020   Procedure: COLONOSCOPY WITH PROPOFOL;  Surgeon: Carol Ada, MD;  [facility];  Service: Endoscopy;  Laterality: N/A;   HERNIA REPAIR  1986   POLYPECTOMY  02/20/2020   Procedure: POLYPECTOMY;  Surgeon: Carol Ada, MD;  [facility];  Service: Endoscopy;;    FAMILY HISTORY: Family History  Problem Relation Age of Onset   Alzheimer's disease Mother     SOCIAL HISTORY: Social History   Socioeconomic History   Marital status: Married    Spouse name: Belinda   Number of children: 1   Years of education: HS   Highest education level: Not on file  Occupational History   Occupation: Retired   Tobacco Use   Smoking status: Never   Smokeless tobacco: Never  Vaping Use   Vaping Use: Never used  Substance and Sexual Activity   Alcohol use: No   Drug use: No   Sexual activity: Not on file  Other Topics Concern   Not on file  Social History Narrative   Patient is married ([place]) and lives at home with his wife.   Patient has two children.   Patient is right-handed.   Patient drinks one cup of coffee daily.      Social Determinants of Health   Financial Resource Strain: Not on file  Food Insecurity: Not on file  Transportation Needs: Not on file  Physical Activity: Not on file  Stress: Not on file  Social Connections: Not on file  Intimate Partner Violence: Not on file     PHYSICAL EXAM  There were no vitals filed for this visit.  There is no height or weight on file to calculate BMI.  Generalized: Well developed, in no acute distress  Cardiology: normal rate and rhythm, no murmur noted Respiratory: clear to auscultation bilaterally  Neurological examination  Mentation: Alert oriented to time, place, history taking. Follows all commands speech and language fluent Cranial nerve II-XII: Pupils were equal round reactive to light. Extraocular movements were full, visual field were full  Motor: The motor testing reveals 5 over 5 strength of all 4 extremities. Good symmetric motor tone is noted throughout.  Gait and  station: Gait is normal.    DIAGNOSTIC DATA (LABS, IMAGING, TESTING) - I reviewed patient records, labs, notes, testing and imaging myself where available.      View : No  data to display.           Lab Results  Component Value Date   WBC 6.7 05/10/2016   HGB 14.3 05/10/2016   HCT 42.9 05/10/2016   MCV 96.4 05/10/2016   PLT 122 (L) 05/10/2016      Component Value Date/Time   NA 137 05/10/2016 0830   K 3.9 05/10/2016 0830   CL 108 05/10/2016 0830   CO2 21 (L) 05/10/2016 0830   GLUCOSE 97 05/10/2016 0830   BUN 16 05/10/2016 0830   CREATININE 1.70 (H) 05/10/2016 0830   CALCIUM 8.2 (L) 05/10/2016 0830   PROT 6.4 (L) 05/10/2016 0830   ALBUMIN 2.5 (L) 05/10/2016 0830   AST 47 (H) 05/10/2016 0830   ALT 45 05/10/2016 0830   ALKPHOS 74 05/10/2016 0830   BILITOT 1.5 (H) 05/10/2016 0830   GFRNONAA 43 (L) 05/10/2016 0830   GFRAA 50 (L) 05/10/2016 0830   No results found for: CHOL, HDL, LDLCALC, LDLDIRECT, TRIG, CHOLHDL No results found for: HGBA1C No results found for: VITAMINB12 No results found for: TSH   ASSESSMENT AND PLAN 61 y.o. year old male  has a past medical history of A-fib ([place]), Anxiety, Heart disease, High cholesterol, Hypertension, Morbid obesity ([place]), OSA (obstructive sleep apnea), and Stroke ([place]). here with   No diagnosis found.    Kemoni C Overall is doing well on BiPAP therapy. Compliance report reveals excellent compliance from last download dated 02/29/2020 through 03/29/2020. He was advised to bring his machine with power cord back to office to download most recent data. He was encouraged to continue using BiPAP nightly and for greater than 4 hours each night. We will update supply orders as indicated. Risks of untreated sleep apnea review and education materials provided. Healthy lifestyle habits encouraged. He will follow up in 1 year,  sooner if needed. He verbalizes understanding and agreement with this plan.    No orders of the defined types were placed in this encounter.    No orders of the defined types were placed in this encounter.    Debbora Presto, FNP-C 08/03/2021, 4:26 PM [facility] [address], [place], [place] [address] [phone]

## 2021-08-04 ENCOUNTER — Ambulatory Visit: Payer: Medicare Other | Admitting: Family Medicine

## 2021-08-04 DIAGNOSIS — G4733 Obstructive sleep apnea (adult) (pediatric): Secondary | ICD-10-CM

## 2021-11-07 ENCOUNTER — Ambulatory Visit (INDEPENDENT_AMBULATORY_CARE_PROVIDER_SITE_OTHER): Payer: Medicare Other | Admitting: Physician Assistant

## 2021-11-07 ENCOUNTER — Encounter: Payer: Self-pay | Admitting: Physician Assistant

## 2021-11-07 ENCOUNTER — Ambulatory Visit (INDEPENDENT_AMBULATORY_CARE_PROVIDER_SITE_OTHER): Payer: Medicare Other

## 2021-11-07 ENCOUNTER — Ambulatory Visit: Payer: Medicare Other | Admitting: Orthopedic Surgery

## 2021-11-07 VITALS — Ht 70.0 in | Wt 285.4 lb

## 2021-11-07 DIAGNOSIS — M25562 Pain in left knee: Secondary | ICD-10-CM | POA: Diagnosis not present

## 2021-11-07 DIAGNOSIS — G8929 Other chronic pain: Secondary | ICD-10-CM | POA: Diagnosis not present

## 2021-11-07 MED ORDER — LIDOCAINE HCL 1 % IJ SOLN
3.0000 mL | INTRAMUSCULAR | Status: AC | PRN
  Administered 2021-11-07: 3 mL

## 2021-11-07 MED ORDER — METHYLPREDNISOLONE ACETATE 40 MG/ML IJ SUSP
40.0000 mg | INTRAMUSCULAR | Status: AC | PRN
  Administered 2021-11-07: 40 mg via INTRA_ARTICULAR

## 2021-11-07 NOTE — Progress Notes (Signed)
Office Visit Note   Patient: Ricky Burton           Date of Birth: 1960-08-12           MRN: [id number] Visit Date: 11/07/2021              Requested by: Jilda Panda, MD [address] [place],  [place] [address] PCP: Jilda Panda, MD   Assessment & Plan: Visit Diagnoses:  1. Chronic pain of left knee     Plan: He will work on quad strengthening exercises as shown.  We will let us know if the knee is improved or not improving.  Weeks.  If he continues to have giving way of the knee has no relief with the injection that was given today would recommend MRI to rule out internal derangement.  Questions were encouraged and answered at length.  Follow-Up Instructions: Return if symptoms worsen or fail to improve.   Orders:  Orders Placed This Encounter  Procedures   Large Joint Inj   XR Knee 1-2 Views Left   No orders of the defined types were placed in this encounter.     Procedures: Large Joint Inj: L knee on 11/07/2021 8:53 AM Indications: pain Details: 22 G 1.5 in needle, anterolateral approach  Arthrogram: No  Medications: 3 mL lidocaine 1 %; 40 mg methylPREDNISolone acetate 40 MG/ML Outcome: tolerated well, no immediate complications Procedure, treatment alternatives, risks and benefits explained, specific risks discussed. Consent was given by the patient. Immediately prior to procedure a time out was called to verify the correct patient, procedure, equipment, support staff and site/side marked as required. Patient was prepped and draped in the usual sterile fashion.       Clinical Data: No additional findings.   Subjective: Chief Complaint  Patient presents with   Left Knee - Pain    HPI Patient is a 61 year old male were seen for the first time for left knee pain that is been ongoing for the last few months.  No particular injury.  He does play golf and he rides a stationary bike 30 minutes a day.  He notes that his pain is 8-9 out of 10 pain at worst.   Mostly along the lateral aspect of the knee states there is area tenderness and pain with palpation at times.  He has tried some NSAIDs that his primary care physician gave him and also an injection is given to her 3 weeks ago by his primary care physician is unsure of what type of injection as well as states that he gave him relief for approximately 3 days.  Mechanical symptoms positive or giving way and otherwise negative.  Notes no swelling in the knee.  Pain is worse with the first step and he also feels like the knee is unstable for step.  He has tried a brace this is using no assistive devices.  He is nondiabetic.  Does have a history of gout.  Review of Systems  Constitutional:  Negative for chills and fever.     Objective: Vital Signs: Ht '5\' 10"'$  (1.778 m)   Wt 285 lb 6.4 oz (129.5 kg)   BMI 40.95 kg/m   Physical Exam Constitutional:      Appearance: He is not ill-appearing or diaphoretic.  Pulmonary:     Effort: Pulmonary effort is normal.  Neurological:     Mental Status: He is alert and oriented to person, place, and time.  Psychiatric:        Mood  and Affect: Mood normal.     Ortho Exam Bilateral knees: Full extension flexion without pain.  No gross instability valgus varus stressing both knees.  Anterior drawer is negative bilaterally.  Lachman's negative bilaterally.  Patellofemoral crepitus with passive range of motion of both knees.  No abnormal warmth erythema or effusion of either knee.  Slight tenderness lateral peripatellar region left knee only.  Lamount Cranker is negative bilaterally.  McMurray's is negative bilaterally.  Atrophy in the VMO's bilaterally. Specialty Comments:  No specialty comments available.  Imaging: XR Knee 1-2 Views Left  Result Date: 11/07/2021 AP and lateral views left knee: Knee is well located.  No acute fractures.  Knee is overall well-preserved.  No bony abnormalities.    PMFS History: Patient Active Problem List   Diagnosis Date  Noted   Stage 3a chronic kidney disease ([place]) 08/04/2019   OSA treated with BiPAP 08/04/2019   Kidney disease 05/08/2016   Hypertension 05/07/2016   Hypotension 05/07/2016   Sepsis ([place]) 05/07/2016   Depression 05/07/2016   Morbid obesity with BMI of 50.0-59.9, adult ([place]) 11/07/2012   MORBID OBESITY 06/01/2010   Atrial fibrillation ([place]) 06/01/2010   OSA (obstructive sleep apnea) 06/01/2010   Past Medical History:  Diagnosis Date   A-fib ([place])    Anxiety    Heart disease    High cholesterol    controlled   Hypertension    Morbid obesity ([place])    OSA (obstructive sleep apnea)    Stroke ([place])    L sided in 2001    Family History  Problem Relation Age of Onset   Alzheimer's disease Mother     Past Surgical History:  Procedure Laterality Date   COLONOSCOPY WITH PROPOFOL N/A 02/20/2020   Procedure: COLONOSCOPY WITH PROPOFOL;  Surgeon: Carol Ada, MD;  Location: [facility];  Service: Endoscopy;  Laterality: N/A;   HERNIA REPAIR  1986   POLYPECTOMY  02/20/2020   Procedure: POLYPECTOMY;  Surgeon: Carol Ada, MD;  Location: [facility];  Service: Endoscopy;;   Social History   Occupational History   Occupation: Retired   Tobacco Use   Smoking status: Never   Smokeless tobacco: Never  Vaping Use   Vaping Use: Never used  Substance and Sexual Activity   Alcohol use: No   Drug use: No   Sexual activity: Not on file

## 2022-01-12 ENCOUNTER — Telehealth: Payer: Self-pay

## 2022-01-12 NOTE — Telephone Encounter (Signed)
Called pt to informed to bring his CPAP machine with power cord. LVM for pt to call back if he has questions.

## 2022-01-13 ENCOUNTER — Ambulatory Visit: Payer: Medicare Other | Admitting: Student

## 2022-01-16 ENCOUNTER — Ambulatory Visit (INDEPENDENT_AMBULATORY_CARE_PROVIDER_SITE_OTHER): Payer: Medicare Other | Admitting: Neurology

## 2022-01-16 ENCOUNTER — Encounter: Payer: Self-pay | Admitting: Neurology

## 2022-01-16 VITALS — BP 125/80 | HR 58 | Ht 69.0 in | Wt 283.5 lb

## 2022-01-16 DIAGNOSIS — I63411 Cerebral infarction due to embolism of right middle cerebral artery: Secondary | ICD-10-CM

## 2022-01-16 DIAGNOSIS — Z9989 Dependence on other enabling machines and devices: Secondary | ICD-10-CM

## 2022-01-16 DIAGNOSIS — G4709 Other insomnia: Secondary | ICD-10-CM | POA: Diagnosis not present

## 2022-01-16 NOTE — Progress Notes (Signed)
SLEEP MEDICINE CLINIC   Provider:  Larey Seat, M D  Referring Provider: Jilda Panda, MD Primary Care Physician:  Jilda Panda, MD  Chief Complaint  Patient presents with   Follow-up    Pt in room #10 with his wife.  Pt here to replace his BiPAP.     HPI: 10.12-2021:    Ricky Burton is a 61 y.o. male , on biPAP but his machine is not working well, sounds and humidifier chamber doesn't close.    [facility]/ [facility]-I have the pleasure of meeting [name]. Ricky Burton again.today , His last sleep study results was in 2018 and at that time he was considered superobese and suffered from obesity hypoventilation syndrome.  He has been followed in this office since 2014.  His current BMI is about 41.8, he has good blood pressure control, and he was a compliant BiPAP user until his machine was recalled.  He brought to today's appointment the [facility] that was given to him as a replacement but he has trouble using it and he says that the different functions of this machine have been impaired.  I looked at his compliance and he has been 100% of the last 30 days using the machine and on average 6 hours at night.  His compliance by hours is 93.3% the BiPAP is set for an inspiratory pressure of 12.6 and for an expiratory pressure of 9.4.  The residual average apnea-hypopnea index is 5.1/h which is sufficient.  He has 2 cm by flex setting.  So we are meeting today because he is due for new machine anyway is very first machine he stated was a ResMed machine but he switched to a BiPAP machine in 5 years ago.    He feels rested in AM and reports having no problem to fall asleep but to stay asleep and Belsomra gives him 5 hours. Without Belsomra only 4 hours. Trazodone was discussed , but he shouldn't use it with Paxil. If he is weaning off , he may try Trazodone. He will discuss with Dr Mellody Drown.    HPI: last year, 07-30-20 seen buy Debbora Presto, NP. That was a routine RV on  BiPAP.  He had received a replacement machine from [facility] after the recall.    08-04-2019: Ricky Burton  is a 61 y.o. AA male with history of hypertension with CKD stage III, hyperlipidemia, morbid obesity with obstructive sleep apnea on BiPAP, and history of CVA in 2001. He underwent BiPAP titration in 05-2016 and remained on the same settings.  Also has history of atrial flutter status post ablation in 2003 in [place], Paroxysmal A. Fib and on Coumadin. This is managed by his PCP.    Fortunately since last office visit he is no clinical chest pain.  States that he is doing well.  Dyspnea is remained stable.  Ricky Burton reports BiPAP titration had shown the greatest benefit of the setting of 12/8 cmH2O and he has been doing very well on the current setting his residual AHI is 4.1 he is 800% compliant patient and he would reports that he feels his sleep is so much better he can hardly sleep without the BiPAP well.  Average use at time is 6 hours 26 minutes each night he has very little air leak the average time per night in air leakage is 18 seconds.  So there is no problem with the mask fit.  Humidifier setting is currently on level 4 ramp time  is only 5 minutes and he has a 2 cm by flex setting.  From these results I do not think we need to change anything if the patient would feel that he is short of breath at night and that he has swallowed air on a regular basis I would have to adjust the inspiratory pressure at this time I think we can leave it where it is.  Patient recieved both COVID shots, is 14 days post second dose now. Pfizer    Ricky Burton, seen last on 06-01-2016, as a referral  from Dr. Mellody Drown for  Re-establishing sleep care. I had seen Ricky Burton about 8 years prior for the last time and at the time had placed him on ASV BiPAP which he continues to use. In the meantime he did have several new health problems including paroxysmal atrial fibrillation with long-term use of  anticoagulants, pain, hypertensive heart disease without heart failure, gout,  Still has superobesity, hypercholesterolemia, and he was admitted in late January until early February  2018 with sepsis. He developed again atrial flutter this time and the cardioversion did not take hold. He has now a higher dose of flecainide to take and states that this has controlled his heart rate. Mr. buffet also has been talking to his primary care physician about significant weight loss and received information about dietary and nutritional approaches to weight loss, and exercise regimen and the remaining surgical options. There is a new clinic with an [place] led by Dr. Leafy Ro and located within the [facility] [place] on  [address] that is specialized in  bariatric medicinal/  nonsurgical option. I would like to add that as the patient provided a data download for his current CPAP, he is actually using BiPAP setting of 17/12 cm water with easy breathing function has used it for 97% of the last 30 days and is considered highly.compliant, with the average user time of 7 hours at night and a residual AHI of only 2.3.  His primary care physician provided a copy of his last sleep study, based on complaints of witnessed apneas, loud snoring, nocturia and dry mouth. In 2008 , his AHI was 80/h and he was titrated to 14 cm water on CPAP but gained further weight, about 60 pounds in the following 6 years.  By 2013 his pressure needs changed and his Epworth sleepiness score was endorsed at 18/24 points. He used BiPAP therapy with a residual AHI at 17 per hour. He was changed to a ASV BiPAP machine.    Chief complaint according to patient : feeling tired again, but is using BiPAP   Sleep habits are as follows: The patient usually goes to bed at midnight, and falls asleep promptly by using his BiPAP. He stays asleep for about 4-1/2 hours before he has his first bathroom break he just wakes up spontaneously. After that he has  trouble to reinitiate initiate sleep. He starts to read when he wakes up early but that may arise at about 5:30. The reading has not helped him to find sleep. He has only one bathroom break at night and usually he is awake when he goes it is not the urge to urinate that wakes him out of sleep. He does not wake with headaches, palpitations, is not diaphoretic or nauseated.  He does not feel that he is completely rested nor restored but he struggles to find more sleep. His bedroom is cool, quiet and dark. He shares a bedroom with his  wife. She has not noticed any new apneas of breakthrough snoring while using his device. Has witnessed air leaks, sometimes loud noises. The water reservoir leaks and the door doesn't close. He may ned a new machine. He was initially placed on positive airway pressure with the addition of oxygen being bled into the machine, but in the meantime he has sometimes traveled without taking the oxygen concentrator or tank with him and felt not different. In the hospital he was not placed on oxygen either. This indicates that oxygen is probably not needed any longer.    Sleep medical history and family sleep history:  Adopted, one daughter , who is healthy , 22 years old.   Social history: Married, one adult daughter, nonsmoker, nondrinker, caffeine use is limited on coffee a day, no soda and no  ice tea. 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   Review of Systems: Out of a complete 14 system review, the patient complains of only the following symptoms, and all other reviewed systems are negative.  In 2008 , his AHI was 80/h and he was titrated to 14 cm water on CPAP but gained further weight, about 60 pounds in the following 6 years.  By 2013 his pressure needs changed and his Epworth sleepiness score was endorsed at 18/24 points. He used BiPAP therapy with a residual AHI at 17 per hour.   Epworth score was pre-  BIPAP  endorsed at 12/ 24 points, fatigue severity score was endorsed at 13 points/  no evidence of major depression with a history of depression,   the patient reports feeling that he doesn't get enough sleep, but he has sleep insomnia not by initiating sleep late but by not being able to sustain sleep.   How likely are you to doze in the following situations: 0 = not likely, 1 = slight chance, 2 = moderate chance, 3 = high chance  Sitting and Reading? Watching Television? Sitting inactive in a public place (theater or meeting)? Lying down in the afternoon when circumstances permit? Sitting and talking to someone? Sitting quietly after lunch without alcohol? In a car, while stopped for a few minutes in traffic? As a passenger in a car for an hour without a break?  Total = 3/ 24 - from 4 last visit.   [facility]>   01-16-2022: fatigue severity score was endorsed at 28 out of 63 points and the Epworth Sleepiness Scale at 3 out of 24 points.  3 out of 15 points and gets no suggestion of a clinical depression.   Social History   Socioeconomic History   Marital status: Married    Spouse name: Belinda   Number of children: 1   Years of education: HS   Highest education level: Not on file  Occupational History   Occupation: Retired   Tobacco Use   Smoking status: Never   Smokeless tobacco: Never  Vaping Use   Vaping Use: Never used  Substance and Sexual Activity   Alcohol use: No   Drug use: No   Sexual activity: Not on file  Other Topics Concern   Not on file  Social History Narrative   Patient is married Ricky Burton) and lives at home with his wife.   Patient has two children.   Patient is right-handed.   Patient drinks one cup of coffee daily.      Social Determinants of Health  Financial Resource Strain: Not on file  Food Insecurity: Not on file  Transportation Needs: Not on file  Physical Activity: Not on file  Stress: Not on file  Social Connections: Not on  file  Intimate Partner Violence: Not on file    Family History  Problem Relation Age of Onset   Alzheimer's disease Mother     Past Medical History:  Diagnosis Date   A-fib [facility])    Anxiety    Heart disease    High cholesterol    controlled   Hypertension    Morbid obesity ([place])    OSA (obstructive sleep apnea)    Stroke ([place])    L sided in 2001    Past Surgical History:  Procedure Laterality Date   COLONOSCOPY WITH PROPOFOL N/A 02/20/2020   Procedure: COLONOSCOPY WITH PROPOFOL;  Surgeon: Carol Ada, MD;  [facility];  Service: Endoscopy;  Laterality: N/A;   HERNIA REPAIR  1986   POLYPECTOMY  02/20/2020   Procedure: POLYPECTOMY;  Surgeon: Carol Ada, MD;  [facility];  Service: Endoscopy;;    Current Outpatient Medications  Medication Sig Dispense Refill   allopurinol (ZYLOPRIM) 100 MG tablet Take 100 mg by mouth daily.     BELSOMRA 20 MG TABS Take 20 mg by mouth at bedtime as needed (Sleep).      Cholecalciferol (VITAMIN D3) 50 MCG (2000 UT) TABS Take 2,000 Units by mouth daily.     flecainide (TAMBOCOR) 100 MG tablet Take 100 mg by mouth See admin instructions. Take 100 mg in AM, 150 mg in PM     PARoxetine (PAXIL) 20 MG tablet Take 10 mg by mouth daily.      rosuvastatin (CRESTOR) 10 MG tablet Take 10 mg by mouth at bedtime.      telmisartan (MICARDIS) 80 MG tablet Take 80 mg by mouth at bedtime.      verapamil (COVERA HS) 180 MG (CO) 24 hr tablet Take 180 mg by mouth daily.     warfarin (COUMADIN) 5 MG tablet Take 5 mg by mouth at bedtime.      No current facility-administered medications for this visit.    Allergies as of 01/16/2022 - Review Complete 01/16/2022  Allergen Reaction Noted   Advair diskus [fluticasone-salmeterol] Palpitations 05/07/2016    Vitals: BP 125/80 (BP Location: Left Arm, Patient Position: Sitting, Cuff Size: Normal)   Pulse (!) 58   Ht '5\' 9"'$  (1.753 m)   Wt 283 lb 8 oz (128.6 kg)   BMI 41.87 kg/m  Last  Weight:  Wt Readings from Last 1 Encounters:  01/16/22 283 lb 8 oz (128.6 kg)   JOA:CZYS mass index is 41.87 kg/m.     Last Height:   Ht Readings from Last 1 Encounters:  01/16/22 '5\' 9"'$  (1.753 m)    Physical exam:  General: The patient is awake, alert and appears not in acute distress. The patient is well groomed. Head: Normocephalic, atraumatic. Neck is supple. Mallampati 4  neck circumference: 20. Nasal airflow  Patent = mildly congested . Retrognathia is seen.  No use of retainers, braces or dentures. Cardiovascular:  irregular rate and rhythm - but slow , without  murmurs or carotid bruit, and without distended neck veins. Respiratory: Lungs are clear to auscultation. Skin:  Without evidence of edema, or rash Trunk: BMI is super obese . The patient's posture is stooped  Neurologic exam : The patient is awake and alert, oriented to place and time.    Cranial nerves: no  loss of smell or taste -  Pupils are equal and briskly reactive to light. Extraocular movements  in vertical and horizontal planes intact and without nystagmus. Hearing to finger rub intact. Facial sensation intact to fine touch. Facial motor strength is symmetric and his tongue and uvula move midline. Shoulder shrug was symmetrical.   Motor exam: Normal tone, muscle bulk and symmetric strength in all extremities. He had a left sided hemiparesis after his stroke.  He has good grip strength but trouble to judge the strength of the left hand's grip, when holding water bottles for example.   Sensory: Finger tingling, left hand feels clumsy, painful.  Fine touch,vibration were tested in both legs. Numbness in left hand. Coordination: Rapid alternating movements/Finger-to-nose maneuver  normal without evidence of ataxia, dysmetria or tremor.  Gait and station: Patient walks without assistive device and is able unassisted to climb up to the exam table. Strength within normal limits. Stance is stable and normal.  Deep tendon  reflexes: in the upper and lower extremities are symmetric and intact.   The patient was advised of the nature of the diagnosed sleep disorder , the treatment options and risks for general a health and wellness arising from not treating the condition.  I spent more than 20 minutes of face to face time with the patient.   Assessment:  After physical and neurologic examination, review of laboratory studies,  Personal review of imaging studies, reports of other /same  Imaging studies ,  Results of polysomnography/ neurophysiology testing and pre-existing records as far as provided in visit., my assessment is   1)   BiPAP at current settings has worked but his machine is broken and needs to be replaced.  OSA risik factors are morbid obesity, hx of stroke, atrial fib and flutter, therapy-  his weight gain in the last made higher pressure CPAP uncomfortable and he was switched to BiPAP.  2) insomnia on Belsomra and paxil, 10 mg.    PLAN :  2) I will order a HST , while not on BiPAP, to see his current baseline. He is not a candidate for Insp[facility] due to BMI. He is not interested anyway.  Baseline HST to replace his BiPAP at 13/ 9 cm water. 2 cm EPR or soft response.   RV in 3-5 months on new machine after HST.  He will discuss with PCP about trazodone to replace Paxil.   Asencion Partridge Shelonda Saxe MD  01/16/2022   CC: Jilda Panda, Md [address] [place],  [place] [address]

## 2022-01-16 NOTE — Patient Instructions (Signed)
Trazodone Tablets What is this medication? TRAZODONE (TRAZ oh done) treats depression. It increases the amount of serotonin in the brain, a hormone that helps regulate mood. This medicine may be used for other purposes; ask your health care provider or pharmacist if you have questions. COMMON BRAND NAME(S): Desyrel What should I tell my care team before I take this medication? They need to know if you have any of these conditions: Attempted suicide or thinking about it Bipolar disorder Bleeding problems Glaucoma Heart disease, or previous heart attack Irregular heart beat Kidney or liver disease Low levels of sodium in the blood An unusual or allergic reaction to trazodone, other medications, foods, dyes or preservatives Pregnant or trying to get pregnant Breast-feeding How should I use this medication? Take this medication by mouth with a glass of water. Follow the directions on the prescription label. Take this medication shortly after a meal or a light snack. Take your medication at regular intervals. Do not take your medication more often than directed. Do not stop taking this medication suddenly except upon the advice of your care team. Stopping this medication too quickly may cause serious side effects or your condition may worsen. A special MedGuide will be given to you by the pharmacist with each prescription and refill. Be sure to read this information carefully each time. Talk to your care team regarding the use of this medication in children. Special care may be needed. Overdosage: If you think you have taken too much of this medicine contact a poison control center or emergency room at once. NOTE: This medicine is only for you. Do not share this medicine with others. What if I miss a dose? If you miss a dose, take it as soon as you can. If it is almost time for your next dose, take only that dose. Do not take double or extra doses. What may interact with this medication? Do not  take this medication with any of the following: Certain medications for fungal infections like fluconazole, itraconazole, ketoconazole, posaconazole, voriconazole Cisapride Dronedarone Linezolid MAOIs like Carbex, Eldepryl, Marplan, Nardil, and Parnate Mesoridazine Methylene blue (injected into a vein) Pimozide Saquinavir Thioridazine This medication may also interact with the following: Alcohol Antiviral medications for HIV or AIDS Aspirin and aspirin-like medications Barbiturates like phenobarbital Certain medications for blood pressure, heart disease, irregular heart beat Certain medications for depression, anxiety, or psychotic disturbances Certain medications for migraine headache like almotriptan, eletriptan, frovatriptan, naratriptan, rizatriptan, sumatriptan, zolmitriptan Certain medications for seizures like carbamazepine and phenytoin Certain medications for sleep Certain medications that treat or prevent blood clots like dalteparin, enoxaparin, warfarin Digoxin Fentanyl Lithium NSAIDS, medications for pain and inflammation, like ibuprofen or naproxen Other medications that prolong the QT interval (cause an abnormal heart rhythm) like dofetilide Rasagiline Supplements like St. John's wort, kava kava, valerian Tramadol Tryptophan This list may not describe all possible interactions. Give your health care provider a list of all the medicines, herbs, non-prescription drugs, or dietary supplements you use. Also tell them if you smoke, drink alcohol, or use illegal drugs. Some items may interact with your medicine. What should I watch for while using this medication? Tell your care team if your symptoms do not get better or if they get worse. Visit your care team for regular checks on your progress. Because it may take several weeks to see the full effects of this medication, it is important to continue your treatment as prescribed by your care team. Watch for new or worsening  thoughts of  suicide or depression. This includes sudden changes in mood, behaviors, or thoughts. These changes can happen at any time but are more common in the beginning of treatment or after a change in dose. Call your care team right away if you experience these thoughts or worsening depression. Manic episodes may happen in patients with bipolar disorder who take this medication. Watch for changes in feelings or behaviors such as feeling anxious, nervous, agitated, panicky, irritable, hostile, aggressive, impulsive, severely restless, overly excited and hyperactive, or trouble sleeping. These changes can happen at any time but are more common in the beginning of treatment or after a change in dose. Call your care team right away if you notice any of these symptoms. You may get drowsy or dizzy. Do not drive, use machinery, or do anything that needs mental alertness until you know how this medication affects you. Do not stand or sit up quickly, especially if you are an older patient. This reduces the risk of dizzy or fainting spells. Alcohol may interfere with the effect of this medication. Avoid alcoholic drinks. This medication may cause dry eyes and blurred vision. If you wear contact lenses you may feel some discomfort. Lubricating drops may help. See your eye doctor if the problem does not go away or is severe. Your mouth may get dry. Chewing sugarless gum, sucking hard candy and drinking plenty of water may help. Contact your care team if the problem does not go away or is severe. What side effects may I notice from receiving this medication? Side effects that you should report to your care team as soon as possible: Allergic reactions--skin rash, itching, hives, swelling of the face, lips, tongue, or throat Bleeding--bloody or black, tar-like stools, red or dark brown urine, vomiting blood or brown material that looks like coffee grounds, small, red or purple spots on skin, unusual bleeding or  bruising Heart rhythm changes--fast or irregular heartbeat, dizziness, feeling faint or lightheaded, chest pain, trouble breathing Low blood pressure--dizziness, feeling faint or lightheaded, blurry vision Low sodium level--muscle weakness, fatigue, dizziness, headache, confusion Prolonged or painful erection Serotonin syndrome--irritability, confusion, fast or irregular heartbeat, muscle stiffness, twitching muscles, sweating, high fever, seizures, chills, vomiting, diarrhea Sudden eye pain or change in vision such as blurry vision, seeing halos around lights, vision loss Thoughts of suicide or self-harm, worsening mood, feelings of depression Side effects that usually do not require medical attention (report to your care team if they continue or are bothersome): Change in sex drive or performance Constipation Dizziness Drowsiness Dry mouth This list may not describe all possible side effects. Call your doctor for medical advice about side effects. You may report side effects to FDA at 1-800-FDA-1088. Where should I keep my medication? Keep out of the reach of children and pets. Store at room temperature between 15 and 30 degrees C (59 to 86 degrees F). Protect from light. Keep container tightly closed. Throw away any unused medication after the expiration date. NOTE: This sheet is a summary. It may not cover all possible information. If you have questions about this medicine, talk to your doctor, pharmacist, or health care provider.  2023 Elsevier/Gold Standard (2020-03-17 00:00:00)  

## 2022-02-10 ENCOUNTER — Ambulatory Visit: Payer: Medicare Other | Admitting: Cardiology

## 2022-02-10 ENCOUNTER — Telehealth: Payer: Self-pay

## 2022-02-10 NOTE — Telephone Encounter (Signed)
Spoke with patient and Patients wife Marliss Coots) about the patients flecainide. Per Dr. Einar Gip the patient is to resume normal dosage tomorrow 02/11/22. Patient expressed understanding and will resume normal dosage tomorrow. Matthew Saras, LPN

## 2022-02-10 NOTE — Telephone Encounter (Signed)
Patients spouse Marliss Coots) called around 1:20 to ask about patients Flecainide dosage.Pt is experiencing a cough and feeling like he is having irregular heart beat. Denied chest pain and SOB. Per Dr. Einar Gip, I told the patients wife to instruct the patient to take two 100 mg Flecainide, and to skip his evening dose of 150 mg. Instructed patient to schedule an appointment for Monday 02/13/2022 if there is no improvement. Patients wife acknowledged and agreed to plan.

## 2022-02-13 ENCOUNTER — Ambulatory Visit: Payer: Medicare Other | Admitting: Cardiology

## 2022-02-13 ENCOUNTER — Encounter: Payer: Self-pay | Admitting: Cardiology

## 2022-02-13 VITALS — BP 134/78 | HR 56 | Resp 16 | Ht 69.0 in | Wt 279.0 lb

## 2022-02-13 DIAGNOSIS — I480 Paroxysmal atrial fibrillation: Secondary | ICD-10-CM

## 2022-02-13 DIAGNOSIS — I10 Essential (primary) hypertension: Secondary | ICD-10-CM

## 2022-02-13 DIAGNOSIS — N1831 Chronic kidney disease, stage 3a: Secondary | ICD-10-CM

## 2022-02-13 DIAGNOSIS — G4733 Obstructive sleep apnea (adult) (pediatric): Secondary | ICD-10-CM

## 2022-02-13 NOTE — Progress Notes (Signed)
Primary Physician/Referring:  Jilda Panda, MD  Patient ID: Ricky Burton, male    DOB: 07/29/1960, 61 y.o.   MRN: [id number]  Chief Complaint  Patient presents with   Atrial Fibrillation   Irregular Heart Beat   Follow-up   HPI:    HPI: Ricky Burton  is a 61 y.o. AA male with history of hypertension with CKD stage III, hyperlipidemia, morbid obesity with obstructive sleep apnea on BiPAP, and history of CVA in 2001. Also has history of atrial flutter status post ablation in 2003 in [place], Paroxysmal A. Fib and on Coumadin. This is managed by his PCP.     He called our office stating that he has been having rapid heartbeat after recent trip to [place] and wanted to be seen. Denies chest pain, dyspnea, dizziness, syncope, near syncope.  He is tolerating anticoagulation without bleeding diathesis.  He continues to use his BiPAP nightly. Just returned from [place] trip with his buddies.  Past Medical History:  Diagnosis Date   A-fib ([place])    Anxiety    Heart disease    High cholesterol    controlled   Hypertension    Morbid obesity ([place])    OSA (obstructive sleep apnea)    Stroke ([place])    L sided in 2001   Past Surgical History:  Procedure Laterality Date   COLONOSCOPY WITH PROPOFOL N/A 02/20/2020   Procedure: COLONOSCOPY WITH PROPOFOL;  Surgeon: Carol Ada, MD;  Location: [facility];  Service: Endoscopy;  Laterality: N/A;   HERNIA REPAIR  1986   POLYPECTOMY  02/20/2020   Procedure: POLYPECTOMY;  Surgeon: Carol Ada, MD;  Location: [facility];  Service: Endoscopy;;   Family History  Problem Relation Age of Onset   Alzheimer's disease Mother    Social History   Tobacco Use   Smoking status: Never   Smokeless tobacco: Never  Substance Use Topics   Alcohol use: No  Marital Status: Married  ROS  Review of Systems  Cardiovascular:  Positive for dyspnea on exertion and palpitations. Negative for chest pain and leg swelling.  Respiratory:  Positive  for snoring (on BiPAP).   Neurological:  Positive for light-headedness.   Objective  Blood pressure 134/78, pulse (!) 56, resp. rate 16, height '5\' 9"'$  (1.753 m), weight 279 lb (126.6 kg), SpO2 96 %. Body mass index is 41.2 kg/m.  Orthostatic VS for the past 72 hrs (Last 3 readings):  Orthostatic BP Patient Position BP Location Cuff Size  02/13/22 1043 140/82 Standing -- --  02/13/22 1042 130/80 -- -- --  02/13/22 1011 -- Sitting Left Arm Large    Physical Exam Constitutional:      Appearance: He is morbidly obese.  Neck:     Vascular: No carotid bruit or JVD.  Cardiovascular:     Rate and Rhythm: Normal rate and regular rhythm.     Pulses: Intact distal pulses.     Heart sounds: Normal heart sounds. No murmur heard.    No gallop.  Pulmonary:     Effort: Pulmonary effort is normal.     Breath sounds: Normal breath sounds.  Abdominal:     General: Bowel sounds are normal.     Palpations: Abdomen is soft.  Musculoskeletal:     Right lower leg: No edema.     Left lower leg: No edema.   Laboratory examination:   External Labs:   External labs 06/23/2021: Total cholesterol 112, HDL 51, LDL 47, triglycerides 62 BUN 19,  creatinine 1.6, GFR 48, sodium 142, potassium 4.4 INR 2.2, PT 21.5, Hgb 15.3, HCT 45.3, MCV 90.7, platelet 173  Allergies   Allergies  Allergen Reactions   Advair Diskus [Fluticasone-Salmeterol] Palpitations    Final Medications at End of Visit    Current Outpatient Medications:    allopurinol (ZYLOPRIM) 100 MG tablet, Take 100 mg by mouth daily., Disp: , Rfl:    BELSOMRA 20 MG TABS, Take 20 mg by mouth at bedtime as needed (Sleep). , Disp: , Rfl:    Cholecalciferol (VITAMIN D3) 50 MCG (2000 UT) TABS, Take 2,000 Units by mouth daily., Disp: , Rfl:    flecainide (TAMBOCOR) 100 MG tablet, Take 100 mg by mouth See admin instructions. Take 100 mg in AM, 150 mg in PM, Disp: , Rfl:    rosuvastatin (CRESTOR) 10 MG tablet, Take 10 mg by mouth at bedtime. , Disp:  , Rfl:    telmisartan (MICARDIS) 80 MG tablet, Take 80 mg by mouth at bedtime. , Disp: , Rfl:    traZODone (DESYREL) 50 MG tablet, Take 50 mg by mouth at bedtime., Disp: , Rfl:    verapamil (COVERA HS) 180 MG (CO) 24 hr tablet, Take 180 mg by mouth daily., Disp: , Rfl:    warfarin (COUMADIN) 5 MG tablet, Take 5 mg by mouth at bedtime. , Disp: , Rfl:    Radiology:    No results found.  Cardiac Studies:   Lexiscan Myoview Stress Test 12/09/2018: 1. Stress EKG is non-diagnostic, as this is pharmacological stress test. 2. The perfusion imaging study demonstrates soft tissue attenuation artifact in the inferior wall. There is no demonstrable ischemia or scar. All segments of left ventricle demonstrated normal wall motion and thickening. Stress LV EF is mildly dysfunctional 48% although visually appears normal. This is a low risk study. Clinical correlation recommended in the patient's with a BMI of 41. 3. Compared to the report from 02/18/2010, no significant change.  Previously EF reported to be normal.   Echocardiogram 12/10/2018: Left ventricle cavity is normal in size. Moderate concentric hypertrophy of the left ventricle. Normal LV systolic function with EF 55%. Normal global wall motion. Indeterminate diastolic filling pattern.  Left atrial cavity is mildly dilated. No hemodynamically significant valvular abnormality. Normal right atrial pressure.  No significant change compared to previous study in 2018.   EKG:   EKG 02/13/2022: Sinus rhythm with first-degree AV block at the rate of 54 bpm, normal axis, incomplete right bundle branch block.  Nonspecific T abnormality cannot rule out high lateral ischemia.  Compared to 01/13/2021, no significant change.   Assessment     ICD-10-CM   1. Paroxysmal atrial fibrillation (HCC)  I48.0 EKG 12-Lead    2. Essential hypertension  I10     3. Stage 3a chronic kidney disease (HCC)  N18.31     4. Class 3 severe obesity due to excess calories  with serious comorbidity and body mass index (BMI) of 40.0 to 44.9 in adult (HCC)  E66.01    Z68.41     5. OSA on BiPAP  G47.33       No orders of the defined types were placed in this encounter.  Medications Discontinued During This Encounter  Medication Reason   PARoxetine (PAXIL) 20 MG tablet    Recommendations:   Ricky Burton  is a 61 y.o. AA male with history of hypertension with CKD stage III, hyperlipidemia, morbid obesity with obstructive sleep apnea on BiPAP, and history of CVA in 2001. Also  has history of atrial flutter status post ablation in 2003 in [place], Paroxysmal A. Fib and on Coumadin. This is managed by his PCP.     He called our office stating that he has been having rapid heartbeat after recent trip to [place] and wanted to be seen.  1. Paroxysmal atrial fibrillation ([place]) Patient is in sinus rhythm and is maintaining sinus rhythm.  Today he states that he is still feeling palpitations in spite of being in sinus rhythm.  Suspect component of anxiety, he also has developed mild myalgias and arthralgias since recent trip and he may be having a viral prodrome.  I reassured him for now and advised him that he should continue to monitor himself and let me know if he still has same symptoms and then we may consider event monitoring.  Suspicion for PE is low as he is on anticoagulation.  INR being managed by his PCP.  2. Essential hypertension Blood pressure is very well controlled on the present medical regimen.  No changes were done.  3. Stage 3a chronic kidney disease ([place]) Reviewed his external labs, stage III chronic disease and also remained stable.  4. Class 3 severe obesity due to excess calories with serious comorbidity and body mass index (BMI) of 40.0 to 44.9 in adult ([place]) Weight loss again discussed with the patient, his marked fatigue may also be related to sleep apnea as well.  5. OSA on BiPAP He has been compliant with CPAP.  He has been followed by  Dr., Dohmeier regarding the same.  Settings felt to be appropriate.  I will see him back in 1 month and if he remains stable on annual basis.    Adrian Prows, MD, [facility] 02/13/2022, 10:57 AM Office: [phone]

## 2022-02-14 ENCOUNTER — Ambulatory Visit (INDEPENDENT_AMBULATORY_CARE_PROVIDER_SITE_OTHER): Payer: Medicare Other | Admitting: Neurology

## 2022-02-14 DIAGNOSIS — G4734 Idiopathic sleep related nonobstructive alveolar hypoventilation: Secondary | ICD-10-CM

## 2022-02-14 DIAGNOSIS — G4733 Obstructive sleep apnea (adult) (pediatric): Secondary | ICD-10-CM

## 2022-02-14 DIAGNOSIS — I63411 Cerebral infarction due to embolism of right middle cerebral artery: Secondary | ICD-10-CM

## 2022-02-14 DIAGNOSIS — Z9989 Dependence on other enabling machines and devices: Secondary | ICD-10-CM

## 2022-02-14 DIAGNOSIS — G4709 Other insomnia: Secondary | ICD-10-CM

## 2022-02-16 NOTE — Progress Notes (Signed)
See procedure note.

## 2022-02-22 ENCOUNTER — Telehealth: Payer: Self-pay | Admitting: Neurology

## 2022-02-22 DIAGNOSIS — G4733 Obstructive sleep apnea (adult) (pediatric): Secondary | ICD-10-CM

## 2022-02-22 NOTE — Telephone Encounter (Signed)
Called pt. Went over results per Dr. Guadelupe Sabin note. Pt aware I sent order to [facility] and they should reach out to him in the next couple weeks. If not, he will call them at [phone].  Scheduled initial BIPAP f/u for 05/25/22 at 9am with AL,NP.   Sent community message to [facility] that order placed.

## 2022-02-22 NOTE — Telephone Encounter (Signed)
Patient's home sleep test was read on behalf of Dr. Brett Fairy. He has been established on BiPAP therapy but needs a new machine.  He had a home sleep test which confirmed severe obstructive sleep apnea.  Please notify patient that I have placed an order for BiPAP therapy.  We can use his current DME provider, please make sure he has a follow-up appointment within 60 to 90 days of starting treatment with his new equipment for follow-up in sleep clinic with Dr. Brett Fairy or nurse practitioner.

## 2022-02-22 NOTE — Procedures (Signed)
[facility]  HOME SLEEP TEST (Watch PAT) REPORT  STUDY DATE: 02/15/2022  DOB: [birth year]  MRN: [id number]  ORDERING CLINICIAN: Star Age, MD, PhD   REFERRING CLINICIAN: Jilda Panda, MD (PCP), Dr. Brett Fairy (sleep)  CLINICAL INFORMATION/HISTORY: 61 year old gentleman with a history of sleep apnea on BiPAP therapy, also history of A-fib, anxiety, heart disease, hyperlipidemia, hypertension, and severe obesity with a BMI of over 40, history of stroke, who presents for reevaluation of his sleep apnea.  He has been compliant with his BiPAP machine and needs new equipment.  Epworth sleepiness score: 3/24.  BMI: 41.8 kg/m  FINDINGS:   Sleep Summary:   Total Recording Time (hours, min): 7 hours, 37 min  Total Sleep Time (hours, min):  6 hours, 20 min  Percent REM (%):    14.7%   Respiratory Indices:   Calculated pAHI (per hour):  50.4/hour         REM pAHI:    63.8/hour       NREM pAHI: 48.1/hour  Central pAHI: 2.5/hour  Oxygen Saturation Statistics:    Oxygen Saturation (%) Mean: 91%   Minimum oxygen saturation (%):                 65%   O2 Saturation Range (%): 65 - 99%    O2 Saturation (minutes) <=88%: 77.3 min  Pulse Rate Statistics:   Pulse Mean (bpm):    56/min    Pulse Range (39 - 82/min)   IMPRESSION: OSA (obstructive sleep apnea), severe Nocturnal Hypoxemia  RECOMMENDATION:  This home sleep test demonstrates severe obstructive sleep apnea with a total AHI of 50.4/hour and O2 nadir of 65%.  Snoring was detected, ranging from mild to loud.  Significant time below or at 88% saturation of over 70 minutes indicates nocturnal hypoxemia without treatment.  Ongoing treatment with positive airway pressure is highly recommended. The patient has been established on BiPAP therapy, he will be issued a new BiPAP machine at 13/9 cm. A laboratory attended titration study can be considered in the future for optimization of his treatment and better  tolerance of therapy.  Alternative treatment options are limited secondary to the severity of the patient's sleep disordered breathing, but may include surgical treatment with Inspire, hypoglossal nerve stimulator, in carefully selected candidates (meeting criteria).  Concomitant weight loss is recommended, where clinically appropriate. Please note, that untreated obstructive sleep apnea may carry additional perioperative morbidity. Patients with significant obstructive sleep apnea should receive perioperative PAP therapy and the surgeons and particularly the anesthesiologist should be informed of the diagnosis and the severity of the sleep disordered breathing. The patient should be cautioned not to drive, work at heights, or operate dangerous or heavy equipment when tired or sleepy. Review and reiteration of good sleep hygiene measures should be pursued with any patient. Other causes of the patient's symptoms, including circadian rhythm disturbances, an underlying mood disorder, medication effect and/or an underlying medical problem cannot be ruled out based on this test. Clinical correlation is recommended.  The patient and his referring provider will be notified of the test results. The patient will be seen in follow up in sleep clinic at [facility].  I certify that I have reviewed the raw data recording prior to the issuance of this report in accordance with the standards of the American Academy of Sleep Medicine (AASM).  INTERPRETING PHYSICIAN:   Star Age, MD, PhD  Board Certified in Neurology and Sleep Medicine  [facility] [address], [address]  [place], [place] [address] [phone]

## 2022-02-27 ENCOUNTER — Ambulatory Visit: Payer: Medicare Other

## 2022-02-27 ENCOUNTER — Other Ambulatory Visit: Payer: Medicare Other

## 2022-02-27 VITALS — BP 167/103 | HR 60 | Resp 16 | Ht 69.0 in | Wt 283.0 lb

## 2022-02-27 DIAGNOSIS — I10 Essential (primary) hypertension: Secondary | ICD-10-CM

## 2022-02-27 DIAGNOSIS — I480 Paroxysmal atrial fibrillation: Secondary | ICD-10-CM

## 2022-02-27 DIAGNOSIS — R002 Palpitations: Secondary | ICD-10-CM

## 2022-02-27 DIAGNOSIS — R42 Dizziness and giddiness: Secondary | ICD-10-CM

## 2022-02-27 MED ORDER — FLECAINIDE ACETATE 100 MG PO TABS: 100.0000 mg | ORAL_TABLET | Freq: Two times a day (BID) | ORAL | 3 refills | Status: DC

## 2022-02-27 MED ORDER — HYDRALAZINE HCL 25 MG PO TABS: 25.0000 mg | ORAL_TABLET | Freq: Three times a day (TID) | ORAL | 3 refills | Status: DC

## 2022-02-27 NOTE — Telephone Encounter (Signed)
Wife has called to report that she just spoke with [facility] about 2-4 mins ago and they are telling her that they need a prescription for a new CPAP for pt.  They are telling her they have not received anything. Please call wife at [phone].  Pt has provided [phone] as a fax # directly to [facility]

## 2022-02-27 NOTE — Telephone Encounter (Signed)
Sent urgent community message to [facility] to have them f/u with pt since we got confirmation 02/22/22 that they received order we sent. Waiting on response.

## 2022-02-27 NOTE — Progress Notes (Signed)
Primary Physician/Referring:  Jilda Panda, MD  Patient ID: Ricky Burton, male    DOB: 06-May-1960, 61 y.o.   MRN: [id number]  Chief Complaint  Patient presents with   Hypertension   Dizziness   HPI:    HPI: Ricky Burton  is a 61 y.o. AA male with history of hypertension with CKD stage III, hyperlipidemia, morbid obesity with obstructive sleep apnea on BiPAP, and history of CVA in 2001. Also has history of atrial flutter status post ablation in 2003 in [place], Paroxysmal A. Fib and on Coumadin. This is managed by his PCP.     Patient presents today for an acute office visit with complaints of palpitations and overall just not feeling like himself.  He is unable to adequately describe why he does not feel like himself but does endorse fogginess and lightheadedness.  The symptoms are intermittent and occur at rest and with exertion and resolve on their own.  He does feel that symptoms could be related to medication and low heart rate.  He currently monitors his blood pressure at home and home readings have been overall well controlled and his heart rate is 40 to 50s. He denies chest pain, shortness of breath, leg edema, orthopnea, PND, TIA/syncope.  Past Medical History:  Diagnosis Date   A-fib ([place])    Anxiety    Heart disease    High cholesterol    controlled   Hypertension    Morbid obesity ([place])    OSA (obstructive sleep apnea)    Stroke ([place])    L sided in 2001   Past Surgical History:  Procedure Laterality Date   COLONOSCOPY WITH PROPOFOL N/A 02/20/2020   Procedure: COLONOSCOPY WITH PROPOFOL;  Surgeon: Carol Ada, MD;  Location: [facility];  Service: Endoscopy;  Laterality: N/A;   HERNIA REPAIR  1986   POLYPECTOMY  02/20/2020   Procedure: POLYPECTOMY;  Surgeon: Carol Ada, MD;  Location: [facility];  Service: Endoscopy;;   Family History  Problem Relation Age of Onset   Alzheimer's disease Mother    Social History   Tobacco Use   Smoking status:  Never   Smokeless tobacco: Never  Substance Use Topics   Alcohol use: No  Marital Status: Married  ROS  Review of Systems  Cardiovascular:  Positive for palpitations. Negative for chest pain, dyspnea on exertion and leg swelling.  Neurological:  Positive for dizziness and light-headedness.   Objective  Blood pressure (!) 167/103, pulse 60, resp. rate 16, height '5\' 9"'$  (1.753 m), weight 283 lb (128.4 kg), SpO2 98 %. Body mass index is 41.79 kg/m.  Orthostatic VS for the past 72 hrs (Last 3 readings):  Orthostatic BP Patient Position BP Location Cuff Size Orthostatic Pulse  02/27/22 1145 146/82 -- -- -- --  02/27/22 1129 (!) 159/101 Standing Left Arm Large 60  02/27/22 1128 (!) 154/94 Sitting Left Arm Large 55  02/27/22 1127 (!) 167/104 Supine Left Arm Large 55    Physical Exam Neck:     Vascular: No carotid bruit or JVD.  Cardiovascular:     Rate and Rhythm: Normal rate and regular rhythm.     Pulses: Intact distal pulses.     Heart sounds: Normal heart sounds. No murmur heard.    No gallop.  Pulmonary:     Effort: Pulmonary effort is normal.     Breath sounds: Normal breath sounds.  Abdominal:     General: Bowel sounds are normal.     Palpations: Abdomen is  soft.  Musculoskeletal:     Right lower leg: No edema.     Left lower leg: No edema.    Laboratory examination:   External Labs:   External labs 06/23/2021: Total cholesterol 112, HDL 51, LDL 47, triglycerides 62 BUN 19, creatinine 1.6, GFR 48, sodium 142, potassium 4.4 INR 2.2, PT 21.5, Hgb 15.3, HCT 45.3, MCV 90.7, platelet 173  Allergies   Allergies  Allergen Reactions   Advair Diskus [Fluticasone-Salmeterol] Palpitations    Final Medications at End of Visit    Current Outpatient Medications:    allopurinol (ZYLOPRIM) 100 MG tablet, Take 100 mg by mouth daily., Disp: , Rfl:    Cholecalciferol (VITAMIN D3) 50 MCG (2000 UT) TABS, Take 2,000 Units by mouth daily., Disp: , Rfl:    rosuvastatin (CRESTOR)  10 MG tablet, Take 10 mg by mouth at bedtime. , Disp: , Rfl:    telmisartan (MICARDIS) 80 MG tablet, Take 80 mg by mouth at bedtime. , Disp: , Rfl:    traZODone (DESYREL) 50 MG tablet, Take 50 mg by mouth at bedtime., Disp: , Rfl:    warfarin (COUMADIN) 5 MG tablet, Take 5 mg by mouth at bedtime. , Disp: , Rfl:    flecainide (TAMBOCOR) 100 MG tablet, Take 1 tablet (100 mg total) by mouth 2 (two) times daily., Disp: 90 tablet, Rfl: 3   hydrALAZINE (APRESOLINE) 25 MG tablet, Take 1 tablet (25 mg total) by mouth 3 (three) times daily., Disp: 180 tablet, Rfl: 3   Radiology:    No results found.  Cardiac Studies:   Lexiscan Myoview Stress Test 12/09/2018: 1. Stress EKG is non-diagnostic, as this is pharmacological stress test. 2. The perfusion imaging study demonstrates soft tissue attenuation artifact in the inferior wall. There is no demonstrable ischemia or scar. All segments of left ventricle demonstrated normal wall motion and thickening. Stress LV EF is mildly dysfunctional 48% although visually appears normal. This is a low risk study. Clinical correlation recommended in the patient's with a BMI of 41. 3. Compared to the report from 02/18/2010, no significant change.  Previously EF reported to be normal.   Echocardiogram 12/10/2018: Left ventricle cavity is normal in size. Moderate concentric hypertrophy of the left ventricle. Normal LV systolic function with EF 55%. Normal global wall motion. Indeterminate diastolic filling pattern.  Left atrial cavity is mildly dilated. No hemodynamically significant valvular abnormality. Normal right atrial pressure.  No significant change compared to previous study in 2018.   EKG:   EKG 02/13/2022: Sinus rhythm with first-degree AV block at the rate of 54 bpm, normal axis, incomplete right bundle branch block.  Nonspecific T abnormality cannot rule out high lateral ischemia.  Compared to 01/13/2021, no significant change.   Assessment     ICD-10-CM    1. Paroxysmal atrial fibrillation (HCC)  I48.0 LONG TERM MONITOR (3-14 DAYS)    2. Palpitations  R00.2 LONG TERM MONITOR (3-14 DAYS)    3. Primary hypertension  I10     4. Dizziness  R42        Meds ordered this encounter  Medications   DISCONTD: hydrALAZINE (APRESOLINE) 25 MG tablet    Sig: Take 1 tablet (25 mg total) by mouth 3 (three) times daily.    Dispense:  180 tablet    Refill:  3    Order Specific Question:   Supervising Provider    Answer:   Adrian Prows [[id number]]   hydrALAZINE (APRESOLINE) 25 MG tablet    Sig: Take 1 tablet (  25 mg total) by mouth 3 (three) times daily.    Dispense:  180 tablet    Refill:  3    Order Specific Question:   Supervising Provider    Answer:   Adrian Prows [[id number]]   flecainide (TAMBOCOR) 100 MG tablet    Sig: Take 1 tablet (100 mg total) by mouth 2 (two) times daily.    Dispense:  90 tablet    Refill:  3    Order Specific Question:   Supervising Provider    Answer:   Adrian Prows [[id number]]   Medications Discontinued During This Encounter  Medication Reason   BELSOMRA 20 MG TABS    verapamil (COVERA HS) 180 MG (CO) 24 hr tablet Change in therapy   hydrALAZINE (APRESOLINE) 25 MG tablet Reorder   flecainide (TAMBOCOR) 100 MG tablet Reorder   Recommendations:   Joell ALDAIR RICKEL  is a 61 y.o. AA male with history of hypertension with CKD stage III, hyperlipidemia, morbid obesity with obstructive sleep apnea on BiPAP, and history of CVA in 2001. Also has history of atrial flutter status post ablation in 2003 in [place], Paroxysmal A. Fib and on Coumadin. This is managed by his PCP.     Paroxysmal atrial fibrillation [facility]) Patient is maintaining sinus rhythm.  Today he states that he is still feeling palpitations in spite of being in sinus rhythm.  Suspect some component of anxiety.  Suspicion for PE is low as he is on Warfarin.  INR being managed by his PCP. Will decrease flecainide to 100 mg twice daily instead of 100 mg in the morning and 150 mg in  the evening as this could be causing lower heart rates in the evenings.  Per patient he was initially on 100 mg twice daily and overall felt better on this dose. Given ongoing complaints of palpitations, will place cardiac event monitor for 1 week.  Essential hypertension Dizziness Blood pressure is markedly elevated at today's visit.  He is not orthostatic despite dizziness. We will stop verapamil as this could be contributing to symptoms of dizziness and lightheadedness. He does monitor his blood pressure at home and reports readings are 130s/70s. We will start hydralazine 25 mg 3 times daily.  Advised patient to continue to monitor blood pressure daily and if readings are >140/80 will increase hydralazine to 50 mg 3 times a day. Continue telmisartan currently ordered.  OSA on BiPAP He has been compliant with CPAP.  He has been followed by Dr. Brett Fairy.  Follow-up at next scheduled office visit or sooner if needed.    Ernst Spell, AGNP-C 02/27/2022, 1:41 PM Office: [phone]

## 2022-03-27 ENCOUNTER — Ambulatory Visit: Payer: Medicare Other | Admitting: Cardiology

## 2022-03-27 ENCOUNTER — Encounter: Payer: Self-pay | Admitting: Cardiology

## 2022-03-27 ENCOUNTER — Ambulatory Visit: Payer: Medicare Other

## 2022-03-27 VITALS — BP 131/80 | HR 54 | Resp 16 | Ht 69.0 in | Wt 281.0 lb

## 2022-03-27 DIAGNOSIS — I480 Paroxysmal atrial fibrillation: Secondary | ICD-10-CM

## 2022-03-27 DIAGNOSIS — G4733 Obstructive sleep apnea (adult) (pediatric): Secondary | ICD-10-CM

## 2022-03-27 DIAGNOSIS — I10 Essential (primary) hypertension: Secondary | ICD-10-CM

## 2022-03-27 DIAGNOSIS — N1831 Chronic kidney disease, stage 3a: Secondary | ICD-10-CM

## 2022-03-27 MED ORDER — HYDRALAZINE HCL 25 MG PO TABS: 25.0000 mg | ORAL_TABLET | Freq: Three times a day (TID) | ORAL | 3 refills | Status: DC

## 2022-03-27 NOTE — Progress Notes (Signed)
Primary Physician/Referring:  Jilda Panda, MD  Patient ID: Ricky Burton, male    DOB: 05-May-1960, 61 y.o.   MRN: [id number]  Chief Complaint  Patient presents with   Atrial Fibrillation   Palpitations   Follow-up    6 week   HPI:    HPI: Ricky Burton  is a 61 y.o. AA male with history of hypertension with CKD stage III, hyperlipidemia, morbid obesity with obstructive sleep apnea on BiPAP, and history of CVA in 2001. Also has history of atrial flutter status post ablation in 2003 in [place], Paroxysmal A. Fib and on Coumadin. This is managed by his PCP.     I had initially seen him on 02/13/2022 when he returned from a golf trip to [place] and had reassured him as this mental fogginess, fatigue and palpitations was not related to A-fib as he had maintained sinus rhythm and also he is recovering from post-COVID infection.  He was again seen on 03/07/2022 for dizziness and fogginess, markedly elevated blood pressure and verapamil was stopped due to dizziness and lightheadedness probably from bradycardia.  He now presents to the office for follow-up.  States that he is feeling the best he has in quite a while and feels like he is back to his baseline normal.  He is tolerating the medications well.  He is accompanied by his wife.  Past Medical History:  Diagnosis Date   A-fib ([place])    Anxiety    Heart disease    High cholesterol    controlled   Hypertension    Morbid obesity ([place])    OSA (obstructive sleep apnea)    Stroke ([place])    L sided in 2001   Past Surgical History:  Procedure Laterality Date   COLONOSCOPY WITH PROPOFOL N/A 02/20/2020   Procedure: COLONOSCOPY WITH PROPOFOL;  Surgeon: Carol Ada, MD;  Location: [facility];  Service: Endoscopy;  Laterality: N/A;   HERNIA REPAIR  1986   POLYPECTOMY  02/20/2020   Procedure: POLYPECTOMY;  Surgeon: Carol Ada, MD;  Location: [facility];  Service: Endoscopy;;   Family History  Problem Relation Age of Onset    Alzheimer's disease Mother    Social History   Tobacco Use   Smoking status: Never   Smokeless tobacco: Never  Substance Use Topics   Alcohol use: No  Marital Status: Married  ROS  Review of Systems  Cardiovascular:  Positive for dyspnea on exertion (stable). Negative for chest pain, leg swelling and palpitations.  Respiratory:  Positive for snoring (on BiPAP).   Neurological:  Negative for light-headedness.   Objective  Blood pressure 131/80, pulse (!) 54, resp. rate 16, height '5\' 9"'$  (1.753 m), weight 281 lb (127.5 kg), SpO2 98 %. Body mass index is 41.5 kg/m.  Orthostatic VS for the past 72 hrs (Last 3 readings):  Patient Position BP Location Cuff Size  03/27/22 1006 Sitting Left Arm Large  03/27/22 1000 Sitting Left Arm Normal    Physical Exam Constitutional:      Appearance: He is morbidly obese.  Neck:     Vascular: No carotid bruit or JVD.  Cardiovascular:     Rate and Rhythm: Normal rate and regular rhythm.     Pulses: Intact distal pulses.     Heart sounds: Normal heart sounds. No murmur heard.    No gallop.  Pulmonary:     Effort: Pulmonary effort is normal.     Breath sounds: Normal breath sounds.  Abdominal:  General: Bowel sounds are normal.     Palpations: Abdomen is soft.  Musculoskeletal:     Right lower leg: No edema.     Left lower leg: No edema.    Laboratory examination:   External Labs:   External labs 06/23/2021:  Total cholesterol 112, HDL 51, LDL 47, triglycerides 62 BUN 19, creatinine 1.6, GFR 48, sodium 142, potassium 4.4 INR 2.2, PT 21.5, Hgb 15.3, HCT 45.3, MCV 90.7, platelet 173  Allergies   Allergies  Allergen Reactions   Advair Diskus [Fluticasone-Salmeterol] Palpitations    Final Medications at End of Visit    Current Outpatient Medications:    allopurinol (ZYLOPRIM) 100 MG tablet, Take 100 mg by mouth daily., Disp: , Rfl:    Cholecalciferol (VITAMIN D3) 50 MCG (2000 UT) TABS, Take 2,000 Units by mouth daily., Disp:  , Rfl:    flecainide (TAMBOCOR) 100 MG tablet, Take 1 tablet (100 mg total) by mouth 2 (two) times daily., Disp: 90 tablet, Rfl: 3   rosuvastatin (CRESTOR) 10 MG tablet, Take 10 mg by mouth at bedtime. , Disp: , Rfl:    telmisartan (MICARDIS) 80 MG tablet, Take 80 mg by mouth at bedtime. , Disp: , Rfl:    traZODone (DESYREL) 50 MG tablet, Take 50 mg by mouth at bedtime., Disp: , Rfl:    warfarin (COUMADIN) 5 MG tablet, Take 5 mg by mouth at bedtime. , Disp: , Rfl:    hydrALAZINE (APRESOLINE) 25 MG tablet, Take 1 tablet (25 mg total) by mouth 3 (three) times daily., Disp: 270 tablet, Rfl: 3   Radiology:    No results found.  Cardiac Studies:   Lexiscan Myoview Stress Test 12/09/2018: 1. Stress EKG is non-diagnostic, as this is pharmacological stress test. 2. The perfusion imaging study demonstrates soft tissue attenuation artifact in the inferior wall. There is no demonstrable ischemia or scar. All segments of left ventricle demonstrated normal wall motion and thickening. Stress LV EF is mildly dysfunctional 48% although visually appears normal. This is a low risk study. Clinical correlation recommended in the patient's with a BMI of 41. 3. Compared to the report from 02/18/2010, no significant change.  Previously EF reported to be normal.   Echocardiogram 12/10/2018: Left ventricle cavity is normal in size. Moderate concentric hypertrophy of the left ventricle. Normal LV systolic function with EF 55%. Normal global wall motion. Indeterminate diastolic filling pattern.  Left atrial cavity is mildly dilated. No hemodynamically significant valvular abnormality. Normal right atrial pressure.  No significant change compared to previous study in 2018.   [facility]io Patch Extended out patient EKG monitoring 7 days starting 02/27/2022: Predominant Rhythm :        Sinus min HR: 39. Max HR 140 bpm Atrial arrhythmias:               Occasional PACs and nonconducted PACs Atrial fibrillation:                   None Ventricular arrhythmias:      Occasional PVCs. PVC Burden <1% Heart Block:                        None Symptoms:                          Correlated with PVCs.   EKG:   EKG 03/17/2022: Sinus rhythm with marked first-degree AV block at rate of 51 beats minute, left enlargement, normal axis.  Nonspecific high lateral T wave inversion, cannot exclude ischemia versus LVH.  No significant change from 02/13/2022.   Assessment     ICD-10-CM   1. Paroxysmal atrial fibrillation (HCC)  I48.0 EKG 12-Lead    2. Primary hypertension  I10 hydrALAZINE (APRESOLINE) 25 MG tablet    3. Stage 3a chronic kidney disease (HCC)  N18.31     4. OSA on BiPAP  G47.33       Meds ordered this encounter  Medications   hydrALAZINE (APRESOLINE) 25 MG tablet    Sig: Take 1 tablet (25 mg total) by mouth 3 (three) times daily.    Dispense:  270 tablet    Refill:  3   Medications Discontinued During This Encounter  Medication Reason   hydrALAZINE (APRESOLINE) 25 MG tablet Reorder    Recommendations:   Ricky Burton  is a 61 y.o. AA male with history of hypertension with CKD stage III, hyperlipidemia, morbid obesity with obstructive sleep apnea on BiPAP, and history of CVA in 2001. Also has history of atrial flutter status post ablation in 2003 in [place], Paroxysmal A. Fib and on Coumadin. This is managed by his PCP.     He called our office stating that he has been having rapid heartbeat after recent trip to [place] and wanted to be seen.  1. Paroxysmal atrial fibrillation (HCC) Patient is maintaining sinus rhythm, he was not feeling well when he had presented couple times in the last several weeks feeling fatigued, palpitations.  His symptoms of chest discomfort/palpitations correlated with PVCs with no recurrence of atrial fibrillation.  Extended EKG monitor reviewed.  2. Primary hypertension Blood pressure is now well-controlled.  Home recordings have been under excellent care with blood pressure  <130/75 mmHg.  He is tolerating hydralazine 3 times a day, verapamil was discontinued due to bradycardia.  He continues to maintain sinus rhythm and states that he is back to himself at baseline.  No changes other than this were done today.  3. Stage 3a chronic kidney disease (HCC) Stage IIIa chronic kidney disease has remained stable.  4. OSA on BiPAP He is compliant with CPAP and he is getting a new machine soon.  Office visit in 6 months or sooner if problems.   Adrian Prows, MD, [facility] 03/27/2022, 10:23 AM Office: [phone]

## 2022-05-03 ENCOUNTER — Telehealth: Payer: Self-pay

## 2022-05-03 NOTE — Telephone Encounter (Signed)
Patients wife called regarding her husbands BP has been running very high 176/111 Hr 51, PCP advised to take hydralazine '50mg'$  at night which they did last night, also c/o dizziness, she insisted you call her on her cell

## 2022-05-04 NOTE — Telephone Encounter (Signed)
Spoke to the patient and advised to increase Hydralazine to 50 mg TID

## 2022-05-09 ENCOUNTER — Ambulatory Visit: Payer: Medicare Other | Admitting: Cardiology

## 2022-05-24 ENCOUNTER — Other Ambulatory Visit: Payer: Self-pay | Admitting: Internal Medicine

## 2022-05-24 ENCOUNTER — Ambulatory Visit
Admission: RE | Admit: 2022-05-24 | Discharge: 2022-05-24 | Disposition: A | Discharge status: Home / Self Care | Payer: Medicare Other | Source: Ambulatory Visit | Attending: Internal Medicine | Admitting: Internal Medicine

## 2022-05-24 DIAGNOSIS — R0989 Other specified symptoms and signs involving the circulatory and respiratory systems: Secondary | ICD-10-CM

## 2022-05-24 NOTE — Patient Instructions (Incomplete)
  It was a pleasure talking to you!  Please continue using your BiPAP regularly. While your insurance requires that you use BiPAP at least 4 hours each night on 70% of the nights, I recommend, that you not skip any nights and use it throughout the night if you can. Getting used to BiPAP and staying with the treatment long term does take time and patience and discipline. Untreated obstructive sleep apnea when it is moderate to severe can have an adverse impact on cardiovascular health and raise her risk for heart disease, arrhythmias, hypertension, congestive heart failure, stroke and diabetes. Untreated obstructive sleep apnea causes sleep disruption, nonrestorative sleep, and sleep deprivation. This can have an impact on your day to day functioning and cause daytime sleepiness and impairment of cognitive function, memory loss, mood disturbance, and problems focussing. Using BiPAP regularly can improve these symptoms.  Continue discussion with PCP regarding sleep. Consider adding melatonin XR at night. Consider seeing a counselor.   Follow up in 1 year

## 2022-05-24 NOTE — Progress Notes (Unsigned)
PATIENT: Ricky Burton DOB: Jun 30, 1960  REASON FOR VISIT: follow up HISTORY FROM: patient  No chief complaint on file.    HISTORY OF PRESENT ILLNESS:  05/24/22 ALL: Ricky Burton returns for follow up for OSA on BiPAP.    01/16/2022 CD:   Ricky Burton is a 62 y.o. male , on biPAP but his machine is not working well, sounds and humidifier chamber doesn't close.     [facility]/ [facility]-I have the pleasure of meeting [name]. Burton again.today , His last sleep study results was in 2018 and at that time he was considered superobese and suffered from obesity hypoventilation syndrome.  He has been followed in this office since 2014.  His current BMI is about 41.8, he has good blood pressure control, and he was a compliant BiPAP user until his machine was recalled.  He brought to today's appointment the [facility] that was given to him as a replacement but he has trouble using it and he says that the different functions of this machine have been impaired.  I looked at his compliance and he has been 100% of the last 30 days using the machine and on average 6 hours at night.  His compliance by hours is 93.3% the BiPAP is set for an inspiratory pressure of 12.6 and for an expiratory pressure of 9.4.  The residual average apnea-hypopnea index is 5.1/h which is sufficient.  He has 2 cm by flex setting.  So we are meeting today because he is due for new machine anyway is very first machine he stated was a ResMed machine but he switched to a BiPAP machine in 5 years ago.     He feels rested in AM and reports having no problem to fall asleep but to stay asleep and Belsomra gives him 5 hours. Without Belsomra only 4 hours. Trazodone was discussed , but he shouldn't use it with Paxil. If he is weaning off , he may try Trazodone. He will discuss with Dr Mellody Drown.  08/04/2020 ALL: Ricky Burton is a 62 y.o. male here today for follow up for OSA on BiPAP. He reports that he is  feeling well. He is sleeping well. He wakes feeling refreshed. He denies concerns with machine or supplies.   Compliance data from 02/29/2020-03/29/2020 shows he used BiPAP 30/30 days with > 4 hour usage at 96.7%. residual AHI was 2 with IPAP of 12cmH20 and EPAP of 8cmH20.  No significant leak noted.    HISTORY: (copied from Dr Dohmeier's previous notes)  HPI: 08-04-2019: Ricky Burton  is a 62 y.o. AA male with history of hypertension with CKD stage III, hyperlipidemia, morbid obesity with obstructive sleep apnea on BiPAP, and history of CVA in 2001. He underwent BiPAP titration in 05-2016 and remained on the same settings.  Also has history of atrial flutter status post ablation in 2003 in [place], Paroxysmal A. Fib and on Coumadin. This is managed by his PCP.    Fortunately since last office visit he is no clinical chest pain.  States that he is doing well.  Dyspnea is remained stable.  Ricky Burton reports BiPAP titration had shown the greatest benefit of the setting of 12/8 cmH2O and he has been doing very well on the current setting his residual AHI is 4.1 he is 800% compliant patient and he would reports that he feels his sleep is so much better he can hardly sleep without the BiPAP well.  Average use  at time is 6 hours 26 minutes each night he has very little air leak the average time per night in air leakage is 18 seconds.  So there is no problem with the mask fit.  Humidifier setting is currently on level 4 ramp time is only 5 minutes and he has a 2 cm by flex setting.  From these results I do not think we need to change anything if the patient would feel that he is short of breath at night and that he has swallowed air on a regular basis I would have to adjust the inspiratory pressure at this time I think we can leave it where it is.   Patient recieved both COVID shots, is 14 days post second dose now. Pfizer    Ricky Burton is a 62 y.o. male , seen last on 06-01-2016, as a referral  from  Dr. Mellody Drown for  Re-establishing sleep care. I had seen Ricky Burton about 8 years prior for the last time and at the time had placed him on ASV BiPAP which he continues to use. In the meantime he did have several new health problems including paroxysmal atrial fibrillation with long-term use of anticoagulants, pain, hypertensive heart disease without heart failure, gout,  Still has superobesity, hypercholesterolemia, and he was admitted in late January until early February  2018 with sepsis. He developed again atrial flutter this time and the cardioversion did not take hold. He has now a higher dose of flecainide to take and states that this has controlled his heart rate. Ricky Burton also has been talking to his primary care physician about significant weight loss and received information about dietary and nutritional approaches to weight loss, and exercise regimen and the remaining surgical options. There is a new clinic with an [place] led by Dr. Leafy Ro and located within the [facility] [place] on  [address] that is specialized in  bariatric medicinal/  nonsurgical option. I would like to add that as the patient provided a data download for his current CPAP, he is actually using BiPAP setting of 17/12 cm water with easy breathing function has used it for 97% of the last 30 days and is considered highly.compliant, with the average user time of 7 hours at night and a residual AHI of only 2.3.   His primary care physician provided a copy of his last sleep study, based on complaints of witnessed apneas, loud snoring, nocturia and dry mouth. 2008 his AHI was 80 and he was titrated to 14 cm water on CPAP but gained further weight, about 60 pounds in the following 6 years. By 2013 his pressure needs changed and his Epworth sleepiness score was endorsed at 18 points. He used BiPAP therapy with a residual AHI at 17 per hour. He was changed to a ASV BiPAP machine.   Chief complaint according to patient : feeling  tired again, but is using BiPAP    Sleep habits are as follows: The patient usually goes to bed at midnight, and falls asleep promptly by using his BiPAP. He stays asleep for about 4-1/2 hours before he has his first bathroom break he just wakes up spontaneously. After that he has trouble to reinitiate initiate sleep. He starts to read when he wakes up early but that may arise at about 5:30. The reading has not helped him to find sleep. He has only one bathroom break at night and usually he is awake when he goes it is not the urge  to urinate that wakes him out of sleep. He does not wake with headaches, palpitations, is not diaphoretic or nauseated.   He does not feel that he is completely rested nor restored but he struggles to find more sleep. His bedroom is cool, quiet and dark. He shares a bedroom with his wife. She has not noticed any new apneas of breakthrough snoring while using his device. Has witnessed air leaks, sometimes loud noises. The water reservoir leaks and the door doesn't close. He may ned a new machine. He was initially placed on positive airway pressure with the addition of oxygen being bled into the machine, but in the meantime he has sometimes traveled without taking the oxygen concentrator or tank with him and felt not different. In the hospital he was not placed on oxygen either. This indicates that oxygen is probably not needed any longer.     Sleep medical history and family sleep history:  Adopted, one daughter , who is healthy , 38 years old.    Social history: Married, one adult daughter, nonsmoker, nondrinker, caffeine use is limited on coffee a day, no soda and no  ice tea. 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   REVIEW OF SYSTEMS: Out of a complete 14 system review of symptoms, the patient complains only of the following symptoms, none and all other reviewed systems are  negative.   ALLERGIES: Allergies  Allergen Reactions   Advair Diskus [Fluticasone-Salmeterol] Palpitations    HOME MEDICATIONS: Outpatient Medications Prior to Visit  Medication Sig Dispense Refill   allopurinol (ZYLOPRIM) 100 MG tablet Take 100 mg by mouth daily.     Cholecalciferol (VITAMIN D3) 50 MCG (2000 UT) TABS Take 2,000 Units by mouth daily.     flecainide (TAMBOCOR) 100 MG tablet Take 1 tablet (100 mg total) by mouth 2 (two) times daily. 90 tablet 3   hydrALAZINE (APRESOLINE) 25 MG tablet Take 1 tablet (25 mg total) by mouth 3 (three) times daily. 270 tablet 3   rosuvastatin (CRESTOR) 10 MG tablet Take 10 mg by mouth at bedtime.      telmisartan (MICARDIS) 80 MG tablet Take 80 mg by mouth at bedtime.      traZODone (DESYREL) 50 MG tablet Take 50 mg by mouth at bedtime.     warfarin (COUMADIN) 5 MG tablet Take 5 mg by mouth at bedtime.      No facility-administered medications prior to visit.    PAST MEDICAL HISTORY: Past Medical History:  Diagnosis Date   A-fib ([place])    Anxiety    Heart disease    High cholesterol    controlled   Hypertension    Morbid obesity ([place])    OSA (obstructive sleep apnea)    Stroke ([place])    L sided in 2001    PAST SURGICAL HISTORY: Past Surgical History:  Procedure Laterality Date   COLONOSCOPY WITH PROPOFOL N/A 02/20/2020   Procedure: COLONOSCOPY WITH PROPOFOL;  Surgeon: Carol Ada, MD;  Location: [facility];  Service: Endoscopy;  Laterality: N/A;   HERNIA REPAIR  1986   POLYPECTOMY  02/20/2020   Procedure: POLYPECTOMY;  Surgeon: Carol Ada, MD;  Location: [facility];  Service: Endoscopy;;    FAMILY HISTORY: Family History  Problem Relation Age of Onset   Alzheimer's disease Mother     SOCIAL HISTORY: Social History   Socioeconomic History   Marital status: Married  Spouse name: Belinda   Number of children: 1   Years of education: HS   Highest education level: Not on file  Occupational History    Occupation: Retired   Tobacco Use   Smoking status: Never   Smokeless tobacco: Never  Vaping Use   Vaping Use: Never used  Substance and Sexual Activity   Alcohol use: No   Drug use: No   Sexual activity: Not on file  Other Topics Concern   Not on file  Social History Narrative   Patient is married Ricky Burton) and lives at home with his wife.   Patient has two children.   Patient is right-handed.   Patient drinks one cup of coffee daily.      Social Determinants of Health   Financial Resource Strain: Not on file  Food Insecurity: Not on file  Transportation Needs: Not on file  Physical Activity: Not on file  Stress: Not on file  Social Connections: Not on file  Intimate Partner Violence: Not on file     PHYSICAL EXAM  There were no vitals filed for this visit.  There is no height or weight on file to calculate BMI.  Generalized: Well developed, in no acute distress  Cardiology: normal rate and rhythm, no murmur noted Respiratory: clear to auscultation bilaterally  Neurological examination  Mentation: Alert oriented to time, place, history taking. Follows all commands speech and language fluent Cranial nerve II-XII: Pupils were equal round reactive to light. Extraocular movements were full, visual field were full  Motor: The motor testing reveals 5 over 5 strength of all 4 extremities. Good symmetric motor tone is noted throughout.  Gait and station: Gait is normal.    DIAGNOSTIC DATA (LABS, IMAGING, TESTING) - I reviewed patient records, labs, notes, testing and imaging myself where available.      No data to display           Lab Results  Component Value Date   WBC 6.7 05/10/2016   HGB 14.3 05/10/2016   HCT 42.9 05/10/2016   MCV 96.4 05/10/2016   PLT 122 (L) 05/10/2016      Component Value Date/Time   NA 137 05/10/2016 0830   K 3.9 05/10/2016 0830   CL 108 05/10/2016 0830   CO2 21 (L) 05/10/2016 0830   GLUCOSE 97 05/10/2016 0830   BUN 16  05/10/2016 0830   CREATININE 1.70 (H) 05/10/2016 0830   CALCIUM 8.2 (L) 05/10/2016 0830   PROT 6.4 (L) 05/10/2016 0830   ALBUMIN 2.5 (L) 05/10/2016 0830   AST 47 (H) 05/10/2016 0830   ALT 45 05/10/2016 0830   ALKPHOS 74 05/10/2016 0830   BILITOT 1.5 (H) 05/10/2016 0830   GFRNONAA 43 (L) 05/10/2016 0830   GFRAA 50 (L) 05/10/2016 0830   No results found for: "CHOL", "HDL", "LDLCALC", "LDLDIRECT", "TRIG", "CHOLHDL" No results found for: "HGBA1C" No results found for: "VITAMINB12" No results found for: "TSH"   ASSESSMENT AND PLAN 62 y.o. year old male  has a past medical history of A-fib ([place]), Anxiety, Heart disease, High cholesterol, Hypertension, Morbid obesity ([place]), OSA (obstructive sleep apnea), and Stroke ([place]). here with   No diagnosis found.    [address] is doing well on BiPAP therapy. Compliance report reveals excellent compliance from last download dated 02/29/2020 through 03/29/2020. He was advised to bring his machine with power cord back to office to download most recent data. He was encouraged to continue using BiPAP nightly and for greater than 4 hours  each night. We will update supply orders as indicated. Risks of untreated sleep apnea review and education materials provided. Healthy lifestyle habits encouraged. He will follow up in 1 year,  sooner if needed. He verbalizes understanding and agreement with this plan.    No orders of the defined types were placed in this encounter.    No orders of the defined types were placed in this encounter.     I spent 15 minutes with the patient. 50% of this time was spent counseling and educating patient on plan of care and medications.    Debbora Presto, FNP-C 05/24/2022, 7:47 AM [facility] [address], [place], [place] [address] [phone]

## 2022-05-25 ENCOUNTER — Encounter: Payer: Self-pay | Admitting: Family Medicine

## 2022-05-25 ENCOUNTER — Ambulatory Visit (INDEPENDENT_AMBULATORY_CARE_PROVIDER_SITE_OTHER): Payer: Medicare Other | Admitting: Family Medicine

## 2022-05-25 VITALS — BP 143/90 | HR 61 | Ht 69.0 in | Wt 278.5 lb

## 2022-05-25 DIAGNOSIS — G4733 Obstructive sleep apnea (adult) (pediatric): Secondary | ICD-10-CM | POA: Diagnosis not present

## 2022-05-30 ENCOUNTER — Encounter: Payer: Self-pay | Admitting: Cardiology

## 2022-05-30 ENCOUNTER — Ambulatory Visit: Payer: Medicare Other | Admitting: Cardiology

## 2022-05-30 VITALS — BP 131/85 | HR 57 | Resp 12 | Ht 69.0 in | Wt 278.0 lb

## 2022-05-30 DIAGNOSIS — I480 Paroxysmal atrial fibrillation: Secondary | ICD-10-CM

## 2022-05-30 DIAGNOSIS — I10 Essential (primary) hypertension: Secondary | ICD-10-CM

## 2022-05-30 DIAGNOSIS — G4733 Obstructive sleep apnea (adult) (pediatric): Secondary | ICD-10-CM

## 2022-05-30 DIAGNOSIS — N1831 Chronic kidney disease, stage 3a: Secondary | ICD-10-CM

## 2022-05-30 MED ORDER — HYDRALAZINE HCL 50 MG PO TABS: 50.0000 mg | ORAL_TABLET | Freq: Three times a day (TID) | ORAL | 3 refills | Status: DC

## 2022-05-30 NOTE — Progress Notes (Signed)
Primary Physician/Referring:  Jilda Panda, MD  Patient ID: Ricky Burton, male    DOB: 12-09-1960, 62 y.o.   MRN: [id number]  No chief complaint on file.  HPI:    HPI: Ricky Burton  is a 62 y.o. AA male with history of hypertension with CKD stage III, hyperlipidemia, morbid obesity with obstructive sleep apnea on BiPAP, and history of CVA in 2001. Also has history of atrial flutter status post ablation in 2003 in [place], Paroxysmal A. Fib and on Coumadin. This is managed by his PCP.     Patient was seen by me 6 weeks ago for elevated blood pressure after he returned from a trip to [place], he developed upper respiratory infection as well along with associated marked fatigue and tiredness.  He now presents for follow-up.  States that he is feeling the best he has in quite a while and feels like he is back to his baseline normal.  He is tolerating the medications well.  He is accompanied by his wife.  Past Medical History:  Diagnosis Date   A-fib [facility])    Anxiety    Heart disease    High cholesterol    controlled   Hypertension    Morbid obesity ([place])    OSA (obstructive sleep apnea)    Paroxysmal atrial fibrillation ([place]) 06/01/2010   Qualifier: Diagnosis of   By: Caryl Comes, MD, Remus Blake      Primary hypertension 05/07/2016   Stroke ([place])    L sided in 2001   Past Surgical History:  Procedure Laterality Date   COLONOSCOPY WITH PROPOFOL N/A 02/20/2020   Procedure: COLONOSCOPY WITH PROPOFOL;  Surgeon: Carol Ada, MD;  [facility];  Service: Endoscopy;  Laterality: N/A;   HERNIA REPAIR  1986   POLYPECTOMY  02/20/2020   Procedure: POLYPECTOMY;  Surgeon: Carol Ada, MD;  Location[facility];  Service: Endoscopy;;   Family History  Problem Relation Age of Onset   Alzheimer's disease Mother    Social History   Tobacco Use   Smoking status: Never   Smokeless tobacco: Never  Substance Use Topics   Alcohol use: No  Marital Status:  Married  ROS  Review of Systems  Cardiovascular:  Positive for dyspnea on exertion (stable). Negative for chest pain, leg swelling and palpitations.  Respiratory:  Positive for snoring (on BiPAP).   Neurological:  Negative for light-headedness.   Objective  Blood pressure 131/85, pulse (!) 57, resp. rate 12, height 5' 9"$  (1.753 m), weight 278 lb (126.1 kg), SpO2 97 %. Body mass index is 41.05 kg/m.     05/30/2022   11:23 AM 05/30/2022   11:03 AM 05/25/2022    9:02 AM  Vitals with BMI  Height  5' 9"$  5' 9"$   Weight  278 lbs 278 lbs 8 oz  BMI  0000000 [id number]  Systolic [id number]  Diastolic 85 83 90  Pulse  57 61   Physical Exam Constitutional:      Appearance: He is morbidly obese.  Neck:     Vascular: No carotid bruit or JVD.  Cardiovascular:     Rate and Rhythm: Normal rate and regular rhythm.     Pulses: Intact distal pulses.     Heart sounds: Normal heart sounds. No murmur heard.    No gallop.  Pulmonary:     Effort: Pulmonary effort is normal.     Breath sounds: Normal breath sounds.  Abdominal:     General: Bowel sounds  are normal.     Palpations: Abdomen is soft.  Musculoskeletal:     Right lower leg: No edema.     Left lower leg: No edema.    Laboratory examination:   External Labs:   Labs 05/10/2022:  Serum glucose 86 mg, sodium 140, potassium 4.5, BUN 23, creatinine 1.4, EGFR 56 mL, LFTs normal.  Hb 16.5/HCT 49.5, platelets 169.  Total cholesterol 169, triglycerides 65, HDL 47, LDL 109.  External labs 06/23/2021:  Total cholesterol 112, HDL 51, LDL 47, triglycerides 62 BUN 19, creatinine 1.6, GFR 48, sodium 142, potassium 4.4 INR 2.2, PT 21.5, Hgb 15.3, HCT 45.3, MCV 90.7, platelet 173  Allergies   Allergies  Allergen Reactions   Advair Diskus [Fluticasone-Salmeterol] Palpitations    Final Medications at End of Visit    Current Outpatient Medications:    allopurinol (ZYLOPRIM) 100 MG tablet, Take 100 mg by mouth daily., Disp: , Rfl:     Cholecalciferol (VITAMIN D3) 50 MCG (2000 UT) TABS, Take 2,000 Units by mouth daily., Disp: , Rfl:    flecainide (TAMBOCOR) 100 MG tablet, Take 1 tablet (100 mg total) by mouth 2 (two) times daily., Disp: 90 tablet, Rfl: 3   PARoxetine (PAXIL) 10 MG tablet, Take 10 mg by mouth daily., Disp: , Rfl:    rosuvastatin (CRESTOR) 10 MG tablet, Take 10 mg by mouth at bedtime. , Disp: , Rfl:    Suvorexant (BELSOMRA) 20 MG TABS, Take 20 mg by mouth at bedtime., Disp: , Rfl:    telmisartan (MICARDIS) 80 MG tablet, Take 80 mg by mouth at bedtime. , Disp: , Rfl:    warfarin (COUMADIN) 5 MG tablet, Take 5 mg by mouth at bedtime. , Disp: , Rfl:    hydrALAZINE (APRESOLINE) 50 MG tablet, Take 1 tablet (50 mg total) by mouth 3 (three) times daily., Disp: 90 tablet, Rfl: 3   Radiology:    CXR 05/24/22: Mild linear opacity left lung base likely scar or atelectasis. No consolidation, pneumothorax or effusion. Normal cardiopericardial silhouette. No edema. Mildly ectatic aorta.   IMPRESSION: Mild left basilar atelectasis. Otherwise no acute cardiopulmonary disease  Cardiac Studies:   Lexiscan Myoview Stress Test 12/09/2018: 1. Stress EKG is non-diagnostic, as this is pharmacological stress test. 2. The perfusion imaging study demonstrates soft tissue attenuation artifact in the inferior wall. There is no demonstrable ischemia or scar. All segments of left ventricle demonstrated normal wall motion and thickening. Stress LV EF is mildly dysfunctional 48% although visually appears normal. This is a low risk study. Clinical correlation recommended in the patient's with a BMI of 41. 3. Compared to the report from 02/18/2010, no significant change.  Previously EF reported to be normal.   Echocardiogram 12/10/2018: Left ventricle cavity is normal in size. Moderate concentric hypertrophy of the left ventricle. Normal LV systolic function with EF 55%. Normal global wall motion. Indeterminate diastolic filling pattern.   Left atrial cavity is mildly dilated. No hemodynamically significant valvular abnormality. Normal right atrial pressure.  No significant change compared to previous study in 2018.   Zio Patch Extended out patient EKG monitoring 7 days starting 02/27/2022: Predominant Rhythm :        Sinus min HR: 39. Max HR 140 bpm Atrial arrhythmias:               Occasional PACs and nonconducted PACs Atrial fibrillation:                  None Ventricular arrhythmias:  Occasional PVCs. PVC Burden <1% Heart Block:                        None Symptoms:                          Correlated with PVCs.   EKG:   EKG 03/17/2022: Sinus rhythm with marked first-degree AV block at rate of 51 beats minute, left enlargement, normal axis.  Nonspecific high lateral T wave inversion, cannot exclude ischemia versus LVH.  No significant change from 02/13/2022.   Assessment     ICD-10-CM   1. Primary hypertension  I10 hydrALAZINE (APRESOLINE) 50 MG tablet    2. Paroxysmal atrial fibrillation (HCC)  I48.0     3. Stage 3a chronic kidney disease (HCC)  N18.31     4. OSA on BiPAP  G47.33       Meds ordered this encounter  Medications   hydrALAZINE (APRESOLINE) 50 MG tablet    Sig: Take 1 tablet (50 mg total) by mouth 3 (three) times daily.    Dispense:  90 tablet    Refill:  3   Medications Discontinued During This Encounter  Medication Reason   hydrALAZINE (APRESOLINE) 25 MG tablet Dose change   hydrALAZINE (APRESOLINE) 50 MG tablet Reorder    Recommendations:   Ricky Burton  is a 62 y.o. AA male with history of hypertension with CKD stage III, hyperlipidemia, morbid obesity with obstructive sleep apnea on BiPAP, and history of CVA in 2001. Also has history of atrial flutter status post ablation in 2003 in [place], Paroxysmal A. Fib and on Coumadin. This is managed by his PCP.     1. Primary hypertension Patient was seen by me for 6-week ago after he returned back from a trip to [place] where he  had acquired bronchitis and also markedly elevated blood pressure and not feeling well.  Since increasing the dose of hydralazine to 50 mg 3 times daily, he is presently doing well and blood pressure at home has been very well-controlled as well.  He is accompanied by his wife.  I reassured him and refilled the prescription for hydralazine.  I also suspect that he was not using CPAP as he had issues with the machine and this has now been fixed.  Hence overall improvement in wellbeing.  2. Paroxysmal atrial fibrillation (HCC) He continues to maintain sinus rhythm.  3. Stage 3a chronic kidney disease ([place]) I reviewed his external labs, stage IIIa chronic kidney disease has remained stable.  He is also on Micardis for renovascular protection.  Continue the same.  4. OSA on BiPAP Compliant with OSA therapy, now has a new machine.  Overall stable from cardiac standpoint, I will see him back on annual basis.  His wife is present, I reviewed the chest x-ray as he has persistent cough, suspect it is probably irritation from residual bronchitis and also some atelectasis that is noted on the chest x-ray.  Advised him to use demineralization and also to try plain Mucinex.   Adrian Prows, MD, [facility] 05/30/2022, 11:34 AM Office: [phone]

## 2022-06-15 ENCOUNTER — Encounter: Payer: Self-pay | Admitting: Radiology

## 2022-08-18 ENCOUNTER — Other Ambulatory Visit: Payer: Self-pay | Admitting: Internal Medicine

## 2022-08-18 DIAGNOSIS — R1310 Dysphagia, unspecified: Secondary | ICD-10-CM

## 2022-08-21 ENCOUNTER — Ambulatory Visit
Admission: RE | Admit: 2022-08-21 | Discharge: 2022-08-21 | Disposition: A | Discharge status: Home / Self Care | Payer: Medicare Other | Source: Ambulatory Visit | Attending: Internal Medicine | Admitting: Internal Medicine

## 2022-08-21 DIAGNOSIS — R1310 Dysphagia, unspecified: Secondary | ICD-10-CM

## 2022-09-26 ENCOUNTER — Ambulatory Visit: Payer: Medicare Other | Admitting: Cardiology

## 2022-11-30 ENCOUNTER — Encounter: Payer: Self-pay | Admitting: Family Medicine

## 2023-01-17 ENCOUNTER — Other Ambulatory Visit: Payer: Self-pay | Admitting: Cardiology

## 2023-01-17 DIAGNOSIS — I10 Essential (primary) hypertension: Secondary | ICD-10-CM

## 2023-02-26 ENCOUNTER — Telehealth: Payer: Self-pay | Admitting: Cardiology

## 2023-02-26 MED ORDER — FLECAINIDE ACETATE 100 MG PO TABS: 100.0000 mg | ORAL_TABLET | Freq: Two times a day (BID) | ORAL | 2 refills | Status: DC

## 2023-02-26 NOTE — Telephone Encounter (Signed)
Resend 100 mg BID. THis is chronic

## 2023-02-26 NOTE — Telephone Encounter (Signed)
I spoke with patient's wife. She reports patient had previously been on flecainide 100 mg in the morning and 150 mg in the evening. This was changed to 100 mg twice daily on 02/27/22.  The 150 mg tablets come from mail order and they have continued to fill this.  Patient has been taking 100 mg tablets twice daily but he runs out early and wife reports she has been splitting the 150 mg tablets in half and then in half again and giving this.   Has been getting 100 mg tablets from a local pharmacy and is requesting refill be sent to [facility] on [address] Will review with Dr Jacinto Halim.

## 2023-02-26 NOTE — Telephone Encounter (Signed)
Pt c/o medication issue:  1. Name of Medication:   flecainide (TAMBOCOR) 100 MG tablet    2. How are you currently taking this medication (dosage and times per day)?  Take 1 tablet (100 mg total) by mouth 2 (two) times daily.       3. Are you having a reaction (difficulty breathing--STAT)? No  4. What is your medication issue? Pt's wife is requesting a callback regarding this medication possible throwing off pt's HR at times and them wanting to discuss another kind of prescription being sent it. Please advise

## 2023-02-26 NOTE — Telephone Encounter (Signed)
I spoke with patient's wife and let her know Flecainide 100 mg twice daily prescription would be sent to [facility] on [address].  She will contact mail order to cancel mail order prescription

## 2023-02-26 NOTE — Telephone Encounter (Signed)
Wife called back and said to make sure and call her at [phone]

## 2023-03-23 ENCOUNTER — Other Ambulatory Visit: Payer: Self-pay | Admitting: Cardiology

## 2023-03-23 DIAGNOSIS — I10 Essential (primary) hypertension: Secondary | ICD-10-CM

## 2023-05-28 ENCOUNTER — Ambulatory Visit: Payer: Medicare Other | Admitting: Family Medicine

## 2023-05-31 ENCOUNTER — Ambulatory Visit: Payer: Medicare Other | Admitting: Cardiology

## 2023-05-31 NOTE — Progress Notes (Deleted)
  Cardiology Office Note:  .   Date:  05/31/2023  ID:  Ricky Burton, Ricky Burton Jul 21, 1960, MRN [id number] PCP: Ralene Ok, MD  [facility] Providers Cardiologist:  Yates Decamp, MD { Click to update primary MD,subspecialty MD or APP then REFRESH:1}  History of Present Illness: .   Ricky Burton is a 63 y.o. AA male with history of hypertension with CKD stage III, hyperlipidemia, morbid obesity with obstructive sleep apnea on BiPAP, and history of CVA in 2001. Also has history of atrial flutter status post ablation in 2003 in [place], Paroxysmal A. Fib and on Coumadin.   Discussed the use of AI scribe software for clinical note transcription with the patient, who gave verbal consent to proceed.  History of Present Illness         Labs   External Labs:  Labs 05/10/2022:   Serum glucose 86 mg, sodium 140, potassium 4.5, BUN 23, creatinine 1.4, EGFR 56 mL, LFTs normal.   Hb 16.5/HCT 49.5, platelets 169.   Total cholesterol 169, triglycerides 65, HDL 47, LDL 109.  ***ROS Physical Exam:   VS:  There were no vitals taken for this visit.   Wt Readings from Last 3 Encounters:  05/30/22 278 lb (126.1 kg)  05/25/22 278 lb 8 oz (126.3 kg)  03/27/22 281 lb (127.5 kg)     ***Physical Exam Studies Reviewed: .    *** EKG:         EKG 03/17/2022: Sinus rhythm with marked first-degree AV block at rate of 51 beats minute, left enlargement, normal axis. Nonspecific high lateral T wave inversion, cannot exclude ischemia versus LVH.   Medications and allergies    Allergies  Allergen Reactions   Advair Diskus [Fluticasone-Salmeterol] Palpitations     Current Outpatient Medications:    allopurinol (ZYLOPRIM) 100 MG tablet, Take 100 mg by mouth daily., Disp: , Rfl:    Cholecalciferol (VITAMIN D3) 50 MCG (2000 UT) TABS, Take 2,000 Units by mouth daily., Disp: , Rfl:    flecainide (TAMBOCOR) 100 MG tablet, Take 1 tablet (100 mg total) by mouth 2 (two) times daily., Disp: 180  tablet, Rfl: 2   hydrALAZINE (APRESOLINE) 50 MG tablet, TAKE 1 TABLET 3 TIMES A DAY, Disp: 270 tablet, Rfl: 0   PARoxetine (PAXIL) 10 MG tablet, Take 10 mg by mouth daily., Disp: , Rfl:    rosuvastatin (CRESTOR) 10 MG tablet, Take 10 mg by mouth at bedtime. , Disp: , Rfl:    Suvorexant (BELSOMRA) 20 MG TABS, Take 20 mg by mouth at bedtime., Disp: , Rfl:    telmisartan (MICARDIS) 80 MG tablet, Take 80 mg by mouth at bedtime. , Disp: , Rfl:    warfarin (COUMADIN) 5 MG tablet, Take 5 mg by mouth at bedtime. , Disp: , Rfl:    ASSESSMENT AND PLAN: .      ICD-10-CM   1. Primary hypertension  I10     2. Paroxysmal atrial fibrillation (HCC)  I48.0     3. Stage 3a chronic kidney disease (HCC)  N18.31       1. Primary hypertension ***  2. Paroxysmal atrial fibrillation (HCC) ***  3. Stage 3a chronic kidney disease (HCC) ***  Assessment and Plan                Signed,  Yates Decamp, MD, [facility] 05/31/2023, 10:01 PM [facility] [address] [place], [place] [address] Phone: [phone]. Fax:  [phone]

## 2023-06-01 ENCOUNTER — Ambulatory Visit: Payer: Medicare Other | Admitting: Cardiology

## 2023-06-01 DIAGNOSIS — N1831 Chronic kidney disease, stage 3a: Secondary | ICD-10-CM

## 2023-06-01 DIAGNOSIS — I480 Paroxysmal atrial fibrillation: Secondary | ICD-10-CM

## 2023-06-01 DIAGNOSIS — I10 Essential (primary) hypertension: Secondary | ICD-10-CM

## 2023-08-09 ENCOUNTER — Ambulatory Visit: Payer: Medicare Other | Attending: Cardiology | Admitting: Cardiology

## 2023-08-09 ENCOUNTER — Encounter: Payer: Self-pay | Admitting: Cardiology

## 2023-08-09 VITALS — BP 122/78 | HR 59 | Ht 69.0 in | Wt 269.6 lb

## 2023-08-09 DIAGNOSIS — R002 Palpitations: Secondary | ICD-10-CM | POA: Insufficient documentation

## 2023-08-09 DIAGNOSIS — I480 Paroxysmal atrial fibrillation: Secondary | ICD-10-CM | POA: Diagnosis not present

## 2023-08-09 DIAGNOSIS — I10 Essential (primary) hypertension: Secondary | ICD-10-CM | POA: Insufficient documentation

## 2023-08-09 MED ORDER — METOPROLOL SUCCINATE ER 25 MG PO TB24: 25.0000 mg | ORAL_TABLET | Freq: Every day | ORAL | 2 refills | Status: DC

## 2023-08-09 NOTE — Progress Notes (Addendum)
 Cardiology Office Note:  .   Date:  08/20/2023  ID:  Burton, Ricky [birth year], MRN [id number] PCP: Edda Goo, MD  [facility] Providers Cardiologist:  Knox Perl, MD   History of Present Illness: .   Ricky Burton is a 63 y.o. AA male with history of hypertension with CKD stage III, hyperlipidemia, morbid obesity with obstructive sleep apnea on BiPAP, and history of CVA in 2001. Also has history of atrial flutter status post ablation in 2003 in [place] , Paroxysmal A. Fib and on Coumadin .   Discussed the use of AI scribe software for clinical note transcription with the patient, who gave verbal consent to proceed.  History of Present Illness Ricky Burton is a 63 year old male with paroxysmal atrial fibrillation who presents for cardiovascular follow-up.  He is on flecainide  100 mg twice a day for paroxysmal atrial fibrillation and is not using Verapamil  due to a low heart rate. He is also on warfarin for anticoagulation. Blood pressure is well controlled with hydralazine  twice a day and telmisartan 80 mg once a day. He has lost ten to twelve pounds since his last visit. He experiences anxiety, particularly at night, and takes Paxil  10 mg. He has difficulty sleeping, achieving only four and a half to five hours per night despite using Belsomra and CPAP therapy with a Nezapril mask. He feels good upon waking.  Labs   External Labs:  Labs 05/10/2022:   Serum glucose 86 mg, sodium 140, potassium 4.5, BUN 23, creatinine 1.4, EGFR 56 mL, LFTs normal.   Hb 16.5/HCT 49.5, platelets 169.   Total cholesterol 169, triglycerides 65, HDL 47, LDL 109.  ROS  Review of Systems  Constitutional: Positive for malaise/fatigue.  Cardiovascular:  Negative for chest pain, dyspnea on exertion and leg swelling.    Physical Exam:   VS:  BP 122/78 (BP Location: Left Arm, Patient Position: Sitting, Cuff Size: Normal)   Pulse (!) 59   Ht 5\' 9"  (1.753 m)   Wt 269 lb 9.6 oz (122.3 kg)    SpO2 96%   BMI 39.81 kg/m    Wt Readings from Last 3 Encounters:  08/09/23 269 lb 9.6 oz (122.3 kg)  05/30/22 278 lb (126.1 kg)  05/25/22 278 lb 8 oz (126.3 kg)    Physical Exam Constitutional:      Appearance: He is obese.  Neck:     Vascular: No carotid bruit or JVD.  Cardiovascular:     Rate and Rhythm: Normal rate and regular rhythm.     Pulses: Intact distal pulses.     Heart sounds: Normal heart sounds. No murmur heard.    No gallop.  Pulmonary:     Effort: Pulmonary effort is normal.     Breath sounds: Normal breath sounds.  Abdominal:     General: Bowel sounds are normal.     Palpations: Abdomen is soft.  Musculoskeletal:     Right lower leg: No edema.     Left lower leg: No edema.    Studies Reviewed: Aaron Aas     EKG:    EKG Interpretation Date/Time:  Thursday Aug 09 2023 14:52:26 EDT Ventricular Rate:  64 PR Interval:  236 QRS Duration:  94 QT Interval:  406 QTC Calculation: 418 R Axis:   -11  Text Interpretation: EKG 08/09/2023: Sinus rhythm with first-degree AV block at the rate of 64 bpm, left enlargement, normal axis.  High lateral T wave inversion, cannot exclude ischemia.  Nonspecific ST  abnormality.  Compared to 05/10/2016, atrial fibrillation not present.  Compared to 03/17/2022, no significant change. Confirmed by Jahmia Berrett, Jagadeesh ([id number]) on 08/09/2023 3:39:49 PM    Medications and allergies    Allergies  Allergen Reactions   Advair Diskus [Fluticasone-Salmeterol] Palpitations     Current Outpatient Medications:    allopurinol (ZYLOPRIM) 100 MG tablet, Take 100 mg by mouth daily., Disp: , Rfl:    Cholecalciferol (VITAMIN D3) 50 MCG (2000 UT) TABS, Take 2,000 Units by mouth daily., Disp: , Rfl:    flecainide  (TAMBOCOR ) 100 MG tablet, Take 1 tablet (100 mg total) by mouth 2 (two) times daily., Disp: 180 tablet, Rfl: 2   hydrALAZINE  (APRESOLINE ) 50 MG tablet, TAKE 1 TABLET 3 TIMES A DAY (Patient taking differently: Take 50 mg by mouth in the morning and  at bedtime.), Disp: 270 tablet, Rfl: 0   metoprolol  succinate (TOPROL -XL) 25 MG 24 hr tablet, Take 1 tablet (25 mg total) by mouth daily. Take with or immediately following a meal., Disp: 30 tablet, Rfl: 2   omeprazole (PRILOSEC) 40 MG capsule, Take 40 mg by mouth daily., Disp: , Rfl:    PARoxetine  (PAXIL ) 10 MG tablet, Take 10 mg by mouth daily., Disp: , Rfl:    Suvorexant (BELSOMRA) 20 MG TABS, Take 20 mg by mouth at bedtime., Disp: , Rfl:    telmisartan (MICARDIS) 80 MG tablet, Take 80 mg by mouth at bedtime. , Disp: , Rfl:    warfarin (COUMADIN ) 5 MG tablet, Take 5 mg by mouth at bedtime. , Disp: , Rfl:    Meds ordered this encounter  Medications   metoprolol  succinate (TOPROL -XL) 25 MG 24 hr tablet    Sig: Take 1 tablet (25 mg total) by mouth daily. Take with or immediately following a meal.    Dispense:  30 tablet    Refill:  2     Medications Discontinued During This Encounter  Medication Reason   rosuvastatin  (CRESTOR ) 10 MG tablet Completed Course     ASSESSMENT AND PLAN: .      ICD-10-CM   1. Primary hypertension  I10 EKG 12-Lead    2. Paroxysmal atrial fibrillation (HCC)  I48.0 metoprolol  succinate (TOPROL -XL) 25 MG 24 hr tablet    3. Palpitations  R00.2     4. Essential hypertension  I10       Assessment and Plan Assessment & Plan Paroxysmal atrial fibrillation   Managed with flecainide  100 mg twice daily. Heart rate is low at 64 bpm. Considering a small dose of metoprolol  succinate to manage heart rate and support flecainide . Advised to monitor heart rate closely, especially if it drops below 55 bpm. Prescribe metoprolol  succinate, 25 mg daily, for 30 days with 2 refills. Continue flecainide  100 mg twice daily.  If he tolerates this we will do 90-day Rx.  Continue warfarin management with Dr. Sandi Crosby.  Symptoms of palpitations are very stable.  Hypertension   Well-controlled with telmisartan and hydralazine . Blood pressure is within normal range. Continue  telmisartan 80 mg once daily and hydralazine  50 mg twice daily.  Sleep apnea   Well-managed with CPAP therapy. Reports feeling good upon waking and is compliant with CPAP use. Continue CPAP therapy.  Chronic anxiety   Chronic anxiety with stressors including family-related issues. Currently taking Paxil  10 mg. Discussed potential benefit of vitamin D for anxiety and suggested consulting a psychologist for stress management. Consider consulting a psychologist for stress management. Continue Paxil  10 mg.  Vitamin D deficiency   Vitamin  D deficiency with no supplementation for two years. Discussed starting vitamin D3 supplementation and monitoring levels. Recommended vitamin D3 5000 IU daily with monthly follow-up to check levels. Start vitamin D3 5000 IU daily. Follow up with Dr. Alycia Babcock to check vitamin D levels monthly.   Signed,  Knox Perl, MD, [facility] 08/20/2023, 9:54 PM [facility] [address] [place], [place] [address] Phone: [phone]. Fax:  [phone]

## 2023-08-09 NOTE — Patient Instructions (Signed)
 Medication Instructions:  Your physician recommends that you continue on your current medications as directed. Please refer to the Current Medication list given to you today.  *If you need a refill on your cardiac medications before your next appointment, please call your pharmacy*  Lab Work: none If you have labs (blood work) drawn today and your tests are completely normal, you will receive your results only by: MyChart Message (if you have MyChart) OR A paper copy in the mail If you have any lab test that is abnormal or we need to change your treatment, we will call you to review the results.  Testing/Procedures: none  Follow-Up: At [facility], you and your health needs are our priority.  As part of our continuing mission to provide you with exceptional heart care, our providers are all part of one team.  This team includes your primary Cardiologist (physician) and Advanced Practice Providers or APPs (Physician Assistants and Nurse Practitioners) who all work together to provide you with the care you need, when you need it.  Your next appointment:   12 month(s)  Provider:   Knox Perl, MD    We recommend signing up for the patient portal called "MyChart".  Sign up information is provided on this After Visit Summary.  MyChart is used to connect with patients for Virtual Visits (Telemedicine).  Patients are able to view lab/test results, encounter notes, upcoming appointments, etc.  Non-urgent messages can be sent to your provider as well.   To learn more about what you can do with MyChart, go to [email or web address].   Other Instructions

## 2023-08-20 ENCOUNTER — Telehealth: Payer: Self-pay | Admitting: Cardiology

## 2023-08-20 DIAGNOSIS — I480 Paroxysmal atrial fibrillation: Secondary | ICD-10-CM

## 2023-08-20 NOTE — Telephone Encounter (Signed)
 Spoke with pt and he stated he has been on the medication for a little over a week (picked up rx about a week after the order was placed) and his HR is normally in the 60s/70s but after being on Toprol -XL his HR has been in the 50s and he has been lightheaded. Pt states he did not take his AM dose today. Explained to pt that I will forward this to Dr. Berry Bristol and his nurse for recommendations.

## 2023-08-20 NOTE — Telephone Encounter (Signed)
 Tell him to cut the medication in half and take it at night

## 2023-08-20 NOTE — Telephone Encounter (Signed)
 Pt c/o medication issue:  1. Name of Medication:  metoprolol  succinate (TOPROL -XL) 25 MG 24 hr tablet   2. How are you currently taking this medication (dosage and times per day)?  Once daily in the evening right after a meal.   3. Are you having a reaction (difficulty breathing--STAT)?   4. What is your medication issue?  Patient says Metoprolol  is causing his HR to drop. 55, 53, and 50 this morning. He says at some point yesterday it was in the 70's, but since starting on it HR has been mainly in the 50's. Patient also has some lightheadedness.

## 2023-08-21 ENCOUNTER — Telehealth: Payer: Self-pay | Admitting: Cardiology

## 2023-08-21 MED ORDER — METOPROLOL SUCCINATE ER 25 MG PO TB24
12.5000 mg | ORAL_TABLET | Freq: Every evening | ORAL | Status: DC
Start: 2023-08-21 — End: 2023-08-21

## 2023-08-21 NOTE — Telephone Encounter (Signed)
 STAT if HR is under 50 or over 120 (normal HR is 60-100 beats per minute)  What is your heart rate?   HR 50  Do you have a log of your heart rate readings (document readings)?   No  Do you have any other symptoms? Dizziness, confusion  Wife Casandra Claw) is concerned patient's HR has been trending low and has stopped taking the medication for the last 3 days.  Wife stated patient was "confused" on Friday/Saturday.  Wife stated patient wants a call back from Dr. Berry Bristol directly.

## 2023-08-21 NOTE — Addendum Note (Signed)
 Addended by: Federico Hopkins on: 08/21/2023 07:58 PM   Modules accepted: Orders

## 2023-08-21 NOTE — Telephone Encounter (Signed)
 Wife returned call and states she is unsure why patient was started on metoprolol  of all was good at his visit. She states his HR has been in the 40's since he started taking the metoprolol . She would like a call back to discuss with Dr. Ganji

## 2023-08-21 NOTE — Telephone Encounter (Signed)
 Duplicate encounter.  Please see encounter from yesterday

## 2023-08-21 NOTE — Telephone Encounter (Signed)
 I called the patient and left a message on his voicemail regarding discontinuing metoprolol .

## 2023-08-21 NOTE — Addendum Note (Signed)
 Addended by: Chenee Munns L on: 08/21/2023 08:26 AM   Modules accepted: Orders

## 2023-08-21 NOTE — Telephone Encounter (Signed)
 Left message with the patient, he has not been able to tolerate even lowest dose of metoprolol , hence will discontinue metoprolol , he has been stable over many years with just plain flecainide  alone for PAF.

## 2023-08-21 NOTE — Telephone Encounter (Signed)
 Patient notified.  He still has questions regarding why this medication was started and requests a call from Dr Berry Bristol to discuss.  Patient reports being very anxious yesterday regarding his heart rate.

## 2023-08-27 ENCOUNTER — Other Ambulatory Visit: Payer: Self-pay

## 2023-08-27 DIAGNOSIS — I10 Essential (primary) hypertension: Secondary | ICD-10-CM

## 2023-08-27 MED ORDER — HYDRALAZINE HCL 50 MG PO TABS: 50.0000 mg | ORAL_TABLET | Freq: Three times a day (TID) | ORAL | 3 refills | Status: AC

## 2023-08-27 NOTE — Progress Notes (Deleted)
 Cardiology Office Note:  .   Date:  08/27/2023  ID:  Randi, College 08/20/1960, MRN [id number] PCP: Edda Goo, MD  [facility] Providers Cardiologist:  Knox Perl, MD { Click to update primary MD,subspecialty MD or APP then REFRESH:1}  History of Present Illness: .   Ricky Burton is a 63 y.o. AA male with history of hypertension with CKD stage III, hyperlipidemia, morbid obesity with obstructive sleep apnea on BiPAP, and history of CVA in 2001. Also has history of atrial flutter status post ablation in 2003 in [place] , Paroxysmal A. Fib and on Coumadin .  I had seen him on 08/09/2023 where he was doing well but in view of patient being on flecainide , I had recommended to restart metoprolol  succinate 25 mg daily.  I have had several telephone calls stating that he is not feeling well, heart rate is too high and low blood pressure episodes.  I advised him to cut the medication in half and eventually advised him to discontinue the medication.  However he wanted to come in to be further evaluated in the office.  Discussed the use of AI scribe software for clinical note transcription with the patient, who gave verbal consent to proceed.  History of Present Illness   Labs   No results found for: "CHOL", "HDL", "LDLCALC", "LDLDIRECT", "TRIG", "CHOLHDL" Lab Results  Component Value Date   NA 137 05/10/2016   K 3.9 05/10/2016   CO2 21 (L) 05/10/2016   GLUCOSE 97 05/10/2016   BUN 16 05/10/2016   CREATININE 1.70 (H) 05/10/2016   CALCIUM  8.2 (L) 05/10/2016   GFRNONAA 43 (L) 05/10/2016      Latest Ref Rng & Units 05/10/2016    8:30 AM 05/10/2016    7:14 AM 05/09/2016    2:14 AM  BMP  Glucose 65 - 99 mg/dL 97  409  811   BUN 6 - 20 mg/dL 16  15  16    Creatinine 0.61 - 1.24 mg/dL 9.14  7.82  9.56   Sodium 135 - 145 mmol/L 137  138  140   Potassium 3.5 - 5.1 mmol/L 3.9  4.0  4.1   Chloride 101 - 111 mmol/L 108  109  107   CO2 22 - 32 mmol/L 21  22  24    Calcium  8.9 - 10.3  mg/dL 8.2  8.2  8.7       Latest Ref Rng & Units 05/10/2016    8:30 AM 05/09/2016    2:14 AM 05/08/2016    3:14 AM  CBC  WBC 4.0 - 10.5 K/uL 6.7  11.0  19.5   Hemoglobin 13.0 - 17.0 g/dL 21.3  08.6  57.8   Hematocrit 39.0 - 52.0 % 42.9  50.0  42.2   Platelets 150 - 400 K/uL 122  122  117    No results found for: "HGBA1C"  No results found for: "TSH"  External Labs:  ***  ROS  ***ROS  Physical Exam:   VS:  There were no vitals taken for this visit.   Wt Readings from Last 3 Encounters:  08/09/23 269 lb 9.6 oz (122.3 kg)  05/30/22 278 lb (126.1 kg)  05/25/22 278 lb 8 oz (126.3 kg)    ***Physical Exam Studies Reviewed: .    *** EKG:         ***  Medications and allergies    Allergies  Allergen Reactions   Advair Diskus [Fluticasone-Salmeterol] Palpitations     Current Outpatient Medications:  allopurinol (ZYLOPRIM) 100 MG tablet, Take 100 mg by mouth daily., Disp: , Rfl:    Cholecalciferol (VITAMIN D3) 50 MCG (2000 UT) TABS, Take 2,000 Units by mouth daily., Disp: , Rfl:    flecainide  (TAMBOCOR ) 100 MG tablet, Take 1 tablet (100 mg total) by mouth 2 (two) times daily., Disp: 180 tablet, Rfl: 2   hydrALAZINE  (APRESOLINE ) 50 MG tablet, TAKE 1 TABLET 3 TIMES A DAY (Patient taking differently: Take 50 mg by mouth in the morning and at bedtime.), Disp: 270 tablet, Rfl: 0   omeprazole (PRILOSEC) 40 MG capsule, Take 40 mg by mouth daily., Disp: , Rfl:    PARoxetine  (PAXIL ) 10 MG tablet, Take 10 mg by mouth daily., Disp: , Rfl:    Suvorexant (BELSOMRA) 20 MG TABS, Take 20 mg by mouth at bedtime., Disp: , Rfl:    telmisartan (MICARDIS) 80 MG tablet, Take 80 mg by mouth at bedtime. , Disp: , Rfl:    warfarin (COUMADIN ) 5 MG tablet, Take 5 mg by mouth at bedtime. , Disp: , Rfl:    No orders of the defined types were placed in this encounter.    There are no discontinued medications.   ASSESSMENT AND PLAN: .      ICD-10-CM   1. Paroxysmal atrial fibrillation (HCC)   I48.0     2. Primary hypertension  I10       Assessment and Plan Assessment & Plan      Signed,  Knox Perl, MD, [facility] 08/27/2023, 8:26 AM [facility] [address] [place], [place] [address] Phone: [phone]. Fax:  [phone]

## 2023-08-28 ENCOUNTER — Ambulatory Visit: Admitting: Cardiology

## 2023-10-26 ENCOUNTER — Encounter: Payer: Self-pay | Admitting: Advanced Practice Midwife

## 2023-11-08 ENCOUNTER — Other Ambulatory Visit: Payer: Self-pay | Admitting: Cardiology

## 2023-12-17 ENCOUNTER — Telehealth: Payer: Self-pay | Admitting: Family Medicine

## 2023-12-17 NOTE — Telephone Encounter (Signed)
 Spoke w/Pt wife regarding Pt not sleeping. Discussed w/wife according to report on [facility] Pt is averaging 6 hours/night in last 30 days. Wife stated Pt reports being tired and not sleeping even though taking Belsomra. Wife asking about labs for low energy and pt being depressed. Informed wife Pt needs to f/u w/PCP for eval for possible labs and depression. Wife voiced understanding. Discussed Pt being due for f/u since last visit was 05/25/22. Offered Pt visit for Oct 2 at 8:30am, wife accepted. Wife voiced thanks for the call back.

## 2023-12-17 NOTE — Telephone Encounter (Signed)
 Patient's wife said patient is depressed, not sleeping (may sleep 3 hours at the most with BIPAP on). Would like a call back to discuss if he can be seen for that. Would like for him to see Dr. Chalice.

## 2024-01-10 ENCOUNTER — Encounter: Payer: Self-pay | Admitting: Neurology

## 2024-01-10 ENCOUNTER — Ambulatory Visit: Admitting: Neurology

## 2024-01-10 VITALS — BP 142/84 | HR 64 | Ht 69.0 in | Wt 271.4 lb

## 2024-01-10 DIAGNOSIS — Z6841 Body Mass Index (BMI) 40.0 and over, adult: Secondary | ICD-10-CM

## 2024-01-10 DIAGNOSIS — I480 Paroxysmal atrial fibrillation: Secondary | ICD-10-CM

## 2024-01-10 DIAGNOSIS — N1831 Chronic kidney disease, stage 3a: Secondary | ICD-10-CM

## 2024-01-10 DIAGNOSIS — Z9989 Dependence on other enabling machines and devices: Secondary | ICD-10-CM | POA: Diagnosis not present

## 2024-01-10 DIAGNOSIS — F411 Generalized anxiety disorder: Secondary | ICD-10-CM | POA: Diagnosis not present

## 2024-01-10 DIAGNOSIS — F422 Mixed obsessional thoughts and acts: Secondary | ICD-10-CM

## 2024-01-10 MED ORDER — ESCITALOPRAM OXALATE 10 MG PO TABS: 10.0000 mg | ORAL_TABLET | Freq: Every day | ORAL | 1 refills | Status: DC

## 2024-01-10 NOTE — Progress Notes (Signed)
 Provider:  Dedra Gores, MD   Primary Care Physician:  Valma Carwin, MD [address] [place] [address]     Referring Provider: Valma Carwin, Md [address] [place],  [place] [address]          Chief Complaint according to patient   Patient presents with:                HISTORY OF PRESENT ILLNESS:  Ricky Burton is a 63 y.o. male patient who is here for revisit 01/10/2024 for  BiPAP, and ongoing insomnia problems. He has been placed on Ambien by dr Valma to help him sleep longer, he never had no trouble to fall asleep, he  failed Belsomra - he knows that he is worried all the time. He feels depressed. Bedtime 11 , asleep 11.05  on BipAP, but wakes up at 2 AM. Sometimes that's it for the rest of the night,sometimes he can go back to sleep after an hour, and if he watches TV he may need 2 hours.  He gets 4 hours total sleep average. OCD, worried about everything. Lack of boundaries.      Chief concern according to patient :  see above.       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     Review of Systems: Out of a complete 14 system review, the patient complains of only the following symptoms, and all other reviewed systems are negative.:   SLEEPINESS ?  How likely are you to doze in the following situations: 0 = not likely, 1 = slight chance, 2 = moderate chance, 3 = high chance  Sitting and Reading? Watching Television? Sitting inactive in a public place (theater or meeting)? Lying down in the afternoon when circumstances permit? Sitting and talking to someone? Sitting quietly after lunch without alcohol? In a car, while stopped for a few minutes in traffic? As a passenger in a car for an hour without a break?  Total = 11 FSS at 31/ 63. GDS ; 8/ 15        Social History   Socioeconomic History   Marital status: Married    Spouse name: Belinda   Number  of children: 1   Years of education: HS   Highest education level: Not on file  Occupational History   Occupation: Retired   Tobacco Use   Smoking status: Never   Smokeless tobacco: Never  Vaping Use   Vaping status: Never Used  Substance and Sexual Activity   Alcohol use: No   Drug use: No   Sexual activity: Not on file  Other Topics Concern   Not on file  Social History Narrative   Patient is married Print production planner) and lives at home with his wife.   Patient has two children.   Patient is right-handed.   Patient drinks one cup of coffee daily.      Social Drivers of Corporate investment banker Strain: Not on file  Food Insecurity: Not on file  Transportation Needs: Not on file  Physical Activity: Not on file  Stress: Not on file  Social Connections: Unknown (08/08/2021)   Received from [facility]   Social Network    Social Network: Not on file    Family History  Problem Relation Age of Onset   Alzheimer's disease Mother    Sleep apnea  Neg Hx     Past Medical History:  Diagnosis Date   A-fib (HCC)    Anxiety    Heart disease    High cholesterol    controlled   Hypertension    Morbid obesity (HCC)    OSA (obstructive sleep apnea)    Paroxysmal atrial fibrillation (HCC) 06/01/2010   Qualifier: Diagnosis of   By: Fernande, MD, CODY Elspeth Darner      Primary hypertension 05/07/2016   Stroke (HCC)    L sided in 2001    Past Surgical History:  Procedure Laterality Date   COLONOSCOPY WITH PROPOFOL  N/A 02/20/2020   Procedure: COLONOSCOPY WITH PROPOFOL ;  Surgeon: Rollin Dover, MD;  Location: [facility];  Service: Endoscopy;  Laterality: N/A;   HERNIA REPAIR  1986   POLYPECTOMY  02/20/2020   Procedure: POLYPECTOMY;  Surgeon: Rollin Dover, MD;  Location: [facility];  Service: Endoscopy;;     Current Outpatient Medications on File Prior to Visit  Medication Sig Dispense Refill   allopurinol (ZYLOPRIM) 100 MG tablet Take 100 mg by mouth daily.      Cholecalciferol (VITAMIN D3) 50 MCG (2000 UT) TABS Take 2,000 Units by mouth daily.     flecainide  (TAMBOCOR ) 100 MG tablet TAKE 1 TABLET(100 MG) BY MOUTH TWICE DAILY 180 tablet 2   hydrALAZINE  (APRESOLINE ) 50 MG tablet Take 1 tablet (50 mg total) by mouth 3 (three) times daily. 270 tablet 3   omeprazole (PRILOSEC) 40 MG capsule Take 40 mg by mouth daily.     PARoxetine  (PAXIL ) 10 MG tablet Take 10 mg by mouth daily.     telmisartan (MICARDIS) 80 MG tablet Take 80 mg by mouth at bedtime.      warfarin (COUMADIN ) 5 MG tablet Take 5 mg by mouth at bedtime.      zolpidem (AMBIEN) 10 MG tablet Take 10 mg by mouth at bedtime.     Suvorexant (BELSOMRA) 20 MG TABS Take 20 mg by mouth at bedtime. (Patient not taking: Reported on 01/10/2024)     No current facility-administered medications on file prior to visit.    Allergies  Allergen Reactions   Advair Diskus [Fluticasone-Salmeterol] Palpitations     DIAGNOSTIC DATA (LABS, IMAGING, TESTING) - I reviewed patient records, labs, notes, testing and imaging myself where available.  Lab Results  Component Value Date   WBC 6.7 05/10/2016   HGB 14.3 05/10/2016   HCT 42.9 05/10/2016   MCV 96.4 05/10/2016   PLT 122 (L) 05/10/2016      Component Value Date/Time   NA 137 05/10/2016 0830   K 3.9 05/10/2016 0830   CL 108 05/10/2016 0830   CO2 21 (L) 05/10/2016 0830   GLUCOSE 97 05/10/2016 0830   BUN 16 05/10/2016 0830   CREATININE 1.70 (H) 05/10/2016 0830   CALCIUM  8.2 (L) 05/10/2016 0830   PROT 6.4 (L) 05/10/2016 0830   ALBUMIN 2.5 (L) 05/10/2016 0830   AST 47 (H) 05/10/2016 0830   ALT 45 05/10/2016 0830   ALKPHOS 74 05/10/2016 0830   BILITOT 1.5 (H) 05/10/2016 0830   GFRNONAA 43 (L) 05/10/2016 0830   GFRAA 50 (L) 05/10/2016 0830   No results found for: CHOL, HDL, LDLCALC, LDLDIRECT, TRIG, CHOLHDL No results found for: YHAJ8R No results found for: VITAMINB12 No results found for: TSH  PHYSICAL EXAM:  Vitals:    01/10/24 0826  BP: (!) 142/84  Pulse: 64  SpO2: 96%   No data found. Body mass index is 40.08 kg/m.  Wt Readings from Last 3 Encounters:  01/10/24 271 lb 6.4 oz (123.1 kg)  08/09/23 269 lb 9.6 oz (122.3 kg)  05/30/22 278 lb (126.1 kg)     Ht Readings from Last 3 Encounters:  01/10/24 5' 9 (1.753 m)  08/09/23 5' 9 (1.753 m)  05/30/22 5' 9 (1.753 m)      General: The patient is awake, alert and appears not in acute distress and groomed. Head: Normocephalic, atraumatic.  Neck is supple. Mallampati 4  neck circumference: 20. Nasal airflow  Patent = mildly congested . Retrognathia is seen.  No use of retainers, braces or dentures. Cardiovascular:  irregular rate and rhythm - but slow ,  without  murmurs or carotid bruit, and without distended neck veins. Respiratory: Lungs are clear to auscultation. Skin:  Without evidence of edema, or rash Trunk: BMI is super obese . The patient's posture is stooped   Neurologic exam : The patient is awake and alert, oriented to place and time.     Cranial nerves: no loss of smell or taste -  Pupils are equal and briskly reactive to light. Extraocular movements  in vertical and horizontal planes intact and without nystagmus. Hearing to finger rub intact. Facial sensation intact to fine touch. Facial motor strength is symmetric and his tongue and uvula move midline. Shoulder shrug was symmetrical.    Motor exam: Normal tone, muscle bulk and symmetric strength in all extremities. He had a left sided hemiparesis after his stroke.  He has good grip strength but trouble to judge the strength of the left hand's grip, when holding water bottles for example.    Sensory: Finger tingling, left hand feels clumsy, painful.  Fine touch,vibration were tested in both legs. Numbness in left hand. Coordination: Rapid alternating movements/Finger-to-nose maneuver  normal without evidence of ataxia, dysmetria or tremor.  Gait and station: Patient walks without  assistive device and is able unassisted to climb up to the exam table. Strength within normal limits. Stance is stable and normal.  Deep tendon reflexes: in the upper and lower extremities are symmetric and intact.   ASSESSMENT AND PLAN :   63 y.o. year old male  here with:  BiPAP dependency and atrial fib.  Excellent compliance.      1) insomnia do to anxiety/ depression, he feels that he is anxious,  worried , depressed.  He has no trouble to fall asleep but to stay asleep   2) BiPAP works excellent, apnea is controlled.  No daytime sleepiness concerns.   3) I will order Lexapro 10 mg  for daily use in exchange of Paxil . .   Wellbutrin  is not rec for atrial fib. He is bradycardic. Tambocor   I asked him to start an audio-book routine, listening to a pleasant voice and  get off the mind carousel.     I would like to thank  Valma Carwin, Md [address] [place],  [place] [address] for allowing me to meet with this pleasant patient.   Sleep Clinic Patients are generally offered input on sleep hygiene, life style changes and how to improve compliance with medical treatment where applicable. Review and reiteration of good sleep hygiene measures is offered to any sleep clinic patient, be it in the first consultation or with any follow up visits.    Any patient with sleepiness should be cautioned not to drive, work at heights, or operate dangerous or heavy equipment when feeling tired or sleepy.     The patient will be  seen in follow-up in the sleep clinic at [facility] for discussion of test results, sleep related symptoms and treatment compliance review, further management strategies, etc.   The referring provider will be notified of the test results.   The patient's condition requires frequent monitoring and adjustments in the treatment plan, reflecting the ongoing complexity of care.  This provider is the continuing focal point for all needed services for this condition.  After spending a  total time of  45  minutes face to face and time for  history taking, physical and neurologic examination, review of laboratory studies,  personal review of imaging studies, reports and results of other testing and review of referral information / records as far as provided in visit,   Electronically signed by: Dedra Gores, MD 01/10/2024 8:39 AM  [facility] Board certified by The [employer] of Sleep Medicine and Diplomate of the [employer] of Sleep Medicine. Board certified In Neurology through the ABPN, Fellow of the [employer] of Neurology.

## 2024-01-10 NOTE — Progress Notes (Signed)
 SABRA

## 2024-01-10 NOTE — Addendum Note (Signed)
 Addended by: CHALICE SAUNAS on: 01/10/2024 09:26 AM   Modules accepted: Orders

## 2024-01-10 NOTE — Patient Instructions (Addendum)
 63 y.o. year old male  here with:  BiPAP dependency and atrial fib.  Excellent compliance.      1) insomnia do to anxiety/ depression, he feels that he is anxious,  worried , depressed.  He has no trouble to fall asleep but to stay asleep   2) BiPAP works excellent, apnea is controlled.  No daytime sleepiness concerns.   3) I will order Lexapro 10 mg  for daily use in exchange of Paxil . .   Wellbutrin  is not rec for atrial fib. He is bradycardic. Tambocor   I asked him to start an audio-book routine, listening to a pleasant voice and  get off the mind carousel.    Escitalopram / LEXAPRO What is this medication? ESCITALOPRAM (es sye TAL oh pram) treats depression and anxiety. It increases the amount of serotonin in the brain, a hormone that helps regulate mood. It belongs to a group of medications called SSRIs. This medicine may be used for other purposes; ask your health care provider or pharmacist if you have questions. COMMON BRAND NAME(S): Lexapro What should I tell my care team before I take this medication? They need to know if you have any of these conditions: Bipolar disorder or a family history of bipolar disorder Diabetes Glaucoma Heart disease Kidney disease Liver disease Receiving electroconvulsive therapy Seizures Suicidal thoughts, plans, or attempt by you or a family member An unusual or allergic reaction to escitalopram, other medications, foods, dyes, or preservatives Pregnant or trying to become pregnant Breastfeeding How should I use this medication? Take this medication by mouth. Follow the directions on the prescription label. Use a specially marked spoon or container to measure your medication. Ask your pharmacist if you do not have one. Household spoons are not accurate. This medication can be taken with or without food. Take your medication at regular intervals. Do not take it more often than directed. Do not stop taking this medication suddenly except upon  the advice of your care team. Stopping this medication too quickly may cause serious side effects or your condition may worsen. A special MedGuide will be given to you by the pharmacist with each prescription and refill. Be sure to read this information carefully each time. Talk to your care team about the use of this medication in children. Special care may be needed. Overdosage: If you think you have taken too much of this medicine contact a poison control center or emergency room at once. NOTE: This medicine is only for you. Do not share this medicine with others. What if I miss a dose? If you miss a dose, take it as soon as you can. If it is almost time for your next dose, take only that dose. Do not take double or extra doses. What may interact with this medication? Do not take this medication with any of the following: Certain medications for fungal infections, such as fluconazole, itraconazole, ketoconazole, posaconazole, voriconazole Cisapride Citalopram Dronedarone Linezolid MAOIs, such as Carbex, Eldepryl, Marplan, Nardil, and Parnate Methylene blue (injected into a vein) Pimozide Thioridazine This medication may also interact with the following: Alcohol Amphetamines Aspirin and aspirin-like medications Carbamazepine Certain medications for mental health conditions Certain medications for migraine headache, such as almotriptan, eletriptan, frovatriptan, naratriptan, rizatriptan, sumatriptan, zolmitriptan Certain medications for sleep Certain medications that treat or prevent blood clots, such as warfarin, enoxaparin, and dalteparin Cimetidine Diuretics Dofetilide Fentanyl Furazolidone Isoniazid Lithium Metoprolol  NSAIDs, medications for pain and inflammation, such as ibuprofen  or naproxen Other medications that cause heart rhythm  changes Procarbazine Rasagiline Supplements, such as St. John's wort, kava kava, valerian Tramadol Tryptophan Ziprasidone This list may  not describe all possible interactions. Give your health care provider a list of all the medicines, herbs, non-prescription drugs, or dietary supplements you use. Also tell them if you smoke, drink alcohol, or use illegal drugs. Some items may interact with your medicine. What should I watch for while using this medication? Tell your care team if your symptoms do not get better or if they get worse. Visit your care team for regular checks on your progress. Because it may take several weeks to see the full effects of this medication, it is important to continue your treatment as prescribed by your care team. Watch for new or worsening thoughts of suicide or depression. This includes sudden changes in mood, behaviors, or thoughts. These changes can happen at any time but are more common in the beginning of treatment or after a change in dose. Call your care team right away if you experience these thoughts or worsening depression. This medication may cause mood and behavior changes, such as anxiety, nervousness, irritability, hostility, restlessness, excitability, hyperactivity, or trouble sleeping. These changes can happen at any time but are more common in the beginning of treatment or after a change in dose. Call your care team right away if you notice any of these symptoms. This medication may affect your coordination, reaction time, or judgment. Do not drive or operate machinery until you know how this medication affects you. Sit up or stand slowly to reduce the risk of dizzy or fainting spells. Drinking alcohol with this medication can increase the risk of these side effects. Your mouth may get dry. Chewing sugarless gum or sucking hard candy, and drinking plenty of water may help. Contact your care team if the problem does not go away or is severe. What side effects may I notice from receiving this medication? Side effects that you should report to your care team as soon as possible: Allergic  reactions--skin rash, itching, hives, swelling of the face, lips, tongue, or throat Bleeding--bloody or black, tar-like stools, red or dark brown urine, vomiting blood or brown material that looks like coffee grounds, small, red or purple spots on skin, unusual bleeding or bruising Heart rhythm changes--fast or irregular heartbeat, dizziness, feeling faint or lightheaded, chest pain, trouble breathing Low sodium level--muscle weakness, fatigue, dizziness, headache, confusion Serotonin syndrome--irritability, confusion, fast or irregular heartbeat, muscle stiffness, twitching muscles, sweating, high fever, seizure, chills, vomiting, diarrhea Sudden eye pain or change in vision such as blurry vision, seeing halos around lights, vision loss Thoughts of suicide or self-harm, worsening mood, feelings of depression Side effects that usually do not require medical attention (report to your care team if they continue or are bothersome): Change in sex drive or performance Diarrhea Excessive sweating Nausea Tremors or shaking Upset stomach This list may not describe all possible side effects. Call your doctor for medical advice about side effects. You may report side effects to FDA at 1-800-FDA-1088. Where should I keep my medication? Keep out of reach of children and pets. Store at room temperature between 15 and 30 degrees C (59 and 86 degrees F). Throw away any unused medication after the expiration date. NOTE: This sheet is a summary. It may not cover all possible information. If you have questions about this medicine, talk to your doctor, pharmacist, or health care provider.  2024 Elsevier/Gold Standard (2022-01-02 00:00:00)  Insomnia is a sleep disorder that makes it difficult  to fall asleep or stay asleep. Insomnia can cause fatigue, low energy, difficulty concentrating, mood swings, and poor performance at work or school. There are three different ways to classify insomnia: Difficulty falling  asleep. Difficulty staying asleep. Waking up too early in the morning. Any type of insomnia can be long-term (chronic) or short-term (acute). Both are common. Short-term insomnia usually lasts for 3 months or less. Chronic insomnia occurs at least three times a week for longer than 3 months. What are the causes? Insomnia may be caused by another condition, situation, or substance, such as: Having certain mental health conditions, such as anxiety and depression. Using caffeine, alcohol, tobacco, or drugs. Having gastrointestinal conditions, such as gastroesophageal reflux disease (GERD). Having certain medical conditions. These include: Asthma. Alzheimer's disease. Stroke. Chronic pain. An overactive thyroid gland (hyperthyroidism). Other sleep disorders, such as restless legs syndrome and sleep apnea. Menopause. Sometimes, the cause of insomnia may not be known. What increases the risk? Risk factors for insomnia include: Gender. Females are affected more often than males. Age. Insomnia is more common as people get older. Stress and certain medical and mental health conditions. Lack of exercise. Having an irregular work schedule. This may include working night shifts and traveling between different time zones. What are the signs or symptoms? If you have insomnia, the main symptom is having trouble falling asleep or having trouble staying asleep. This may lead to other symptoms, such as: Feeling tired or having low energy. Feeling nervous about going to sleep. Not feeling rested in the morning. Having trouble concentrating. Feeling irritable, anxious, or depressed. How is this diagnosed? This condition may be diagnosed based on: Your symptoms and medical history. Your health care provider may ask about: Your sleep habits. Any medical conditions you have. Your mental health. A physical exam. How is this treated? Treatment for insomnia depends on the cause. Treatment may focus on  treating an underlying condition that is causing the insomnia. Treatment may also include: Medicines to help you sleep. Counseling or therapy. Lifestyle adjustments to help you sleep better. Follow these instructions at home: Eating and drinking  Limit or avoid alcohol, caffeinated beverages, and products that contain nicotine and tobacco, especially close to bedtime. These can disrupt your sleep. Do not eat a large meal or eat spicy foods right before bedtime. This can lead to digestive discomfort that can make it hard for you to sleep. Sleep habits  Keep a sleep diary to help you and your health care provider figure out what could be causing your insomnia. Write down: When you sleep. When you wake up during the night. How well you sleep and how rested you feel the next day. Any side effects of medicines you are taking. What you eat and drink. Make your bedroom a dark, comfortable place where it is easy to fall asleep. Put up shades or blackout curtains to block light from outside. Use a white noise machine to block noise. Keep the temperature cool. Limit screen use before bedtime. This includes: Not watching TV. Not using your smartphone, tablet, or computer. Stick to a routine that includes going to bed and waking up at the same times every day and night. This can help you fall asleep faster. Consider making a quiet activity, such as reading, part of your nighttime routine. Try to avoid taking naps during the day so that you sleep better at night. Get out of bed if you are still awake after 15 minutes of trying to sleep. Keep the lights  down, but try reading or doing a quiet activity. When you feel sleepy, go back to bed. General instructions Take over-the-counter and prescription medicines only as told by your health care provider. Exercise regularly as told by your health care provider. However, avoid exercising in the hours right before bedtime. Use relaxation techniques to manage  stress. Ask your health care provider to suggest some techniques that may work well for you. These may include: Breathing exercises. Routines to release muscle tension. Visualizing peaceful scenes. Make sure that you drive carefully. Do not drive if you feel very sleepy. Keep all follow-up visits. This is important. Contact a health care provider if: You are tired throughout the day. You have trouble in your daily routine due to sleepiness. You continue to have sleep problems, or your sleep problems get worse. Get help right away if: You have thoughts about hurting yourself or someone else. Get help right away if you feel like you may hurt yourself or others, or have thoughts about taking your own life. Go to your nearest emergency room or: Call 911. Call the National Suicide Prevention Lifeline at 260-130-7519 or 988. This is open 24 hours a day. Text the Crisis Text Line at 437-637-0978. Summary Insomnia is a sleep disorder that makes it difficult to fall asleep or stay asleep. Insomnia can be long-term (chronic) or short-term (acute). Treatment for insomnia depends on the cause. Treatment may focus on treating an underlying condition that is causing the insomnia. Keep a sleep diary to help you and your health care provider figure out what could be causing your insomnia. This information is not intended to replace advice given to you by your health care provider. Make sure you discuss any questions you have with your health care provider. Document Revised: 03/07/2021 Document Reviewed: 03/07/2021 Elsevier Patient Education  2024 [employer].

## 2024-02-05 ENCOUNTER — Encounter: Payer: Self-pay | Admitting: General Practice

## 2024-02-05 ENCOUNTER — Telehealth: Payer: Self-pay | Admitting: Cardiology

## 2024-02-05 ENCOUNTER — Ambulatory Visit: Attending: General Practice | Admitting: General Practice

## 2024-02-05 VITALS — BP 135/86 | HR 51 | Ht 69.0 in | Wt 267.8 lb

## 2024-02-05 DIAGNOSIS — I480 Paroxysmal atrial fibrillation: Secondary | ICD-10-CM | POA: Diagnosis not present

## 2024-02-05 DIAGNOSIS — G4733 Obstructive sleep apnea (adult) (pediatric): Secondary | ICD-10-CM | POA: Insufficient documentation

## 2024-02-05 DIAGNOSIS — F419 Anxiety disorder, unspecified: Secondary | ICD-10-CM | POA: Diagnosis not present

## 2024-02-05 DIAGNOSIS — I10 Essential (primary) hypertension: Secondary | ICD-10-CM | POA: Diagnosis present

## 2024-02-05 NOTE — Telephone Encounter (Signed)
 Spoke with pt's wife and advised for her to call and let pt's neurologist know about these symptoms occurring after switching from Paxil  to Lexapro. Pt's wife stated she wanted to call us  because the pt has had a hx of irregular HR before. Educated pt's wife on Lexapro being an SSRI and if combined with certain medications, it can pose a risk of prolonging QT interval with pt's rhythm and that because the neurologist is the one that made this medication switch, they need to be made aware of these symptoms as well. Pt's wife verbalized understanding of this and stated she would also call their office to make them aware. Explained that Dr. Ladona is out of the office this week but we can get the pt in to see an APP to be evaluated, pt's wife agreed to this. Pt is scheduled to see Josefa Beauvais, NP at 3:10 PM today 02/05/24. Josefa is aware of pt being added to schedule.

## 2024-02-05 NOTE — Telephone Encounter (Signed)
 Pt c/o medication issue:  1. Name of Medication:   escitalopram (LEXAPRO) 10 MG tablet   2. How are you currently taking this medication (dosage and times per day)?  As prescribed  3. Are you having a reaction (difficulty breathing--STAT)?   4. What is your medication issue?   Wife Donelda) stated patient's neurologist took him off Paxil  and prescribed him Lexapro however patient is now having irregular heart beats.   Patient c/o Palpitations:  STAT if patient reporting lightheadedness, shortness of breath, or chest pain  How long have you had palpitations/irregular HR/ Afib? Are you having the symptoms now?   No - Yesterday patient had irregular hear beats  Are you currently experiencing lightheadedness, SOB or CP? No  Do you have a history of afib (atrial fibrillation) or irregular heart rhythm?   Yes  Have you checked your BP or HR? (document readings if available):   Not checked today  Are you experiencing any other symptoms?   No

## 2024-02-05 NOTE — Progress Notes (Signed)
 Cardiology Clinic Note   Patient Name: Ricky Burton Date of Encounter: 02/05/2024  Primary Care Provider:  Valma Carwin, MD Primary Cardiologist:  Gordy Bergamo, MD  Patient Profile    Ricky Burton 63 year old male presents the clinic today for evaluation of his irregular heartbeat.  Past Medical History    Past Medical History:  Diagnosis Date   A-fib [facility])    Anxiety    Heart disease    High cholesterol    controlled   Hypertension    Morbid obesity (HCC)    OSA (obstructive sleep apnea)    Paroxysmal atrial fibrillation (HCC) 06/01/2010   Qualifier: Diagnosis of   By: Fernande, MD, CODY Elspeth Darner      Primary hypertension 05/07/2016   Stroke (HCC)    L sided in 2001   Past Surgical History:  Procedure Laterality Date   COLONOSCOPY WITH PROPOFOL  N/A 02/20/2020   Procedure: COLONOSCOPY WITH PROPOFOL ;  Surgeon: Rollin Dover, MD;  Location: [facility] ENDOSCOPY;  Service: Endoscopy;  Laterality: N/A;   HERNIA REPAIR  1986   POLYPECTOMY  02/20/2020   Procedure: POLYPECTOMY;  Surgeon: Rollin Dover, MD;  Location: [facility] ENDOSCOPY;  Service: Endoscopy;;    Allergies  Allergies  Allergen Reactions   Advair Diskus [Fluticasone-Salmeterol] Palpitations    History of Present Illness    Ricky Burton has a PMH of HTN, CVA 2001, paroxysmal atrial fibrillation, OSA on BiPAP, stage IIIa CKD, morbid obesity, depression, and insomnia.  He also has a history of atrial flutter and underwent ablation in 2003 in [place] .  He is on Coumadin .  He was seen in follow-up by Dr. Bergamo on 08/09/2023.  During that time he was continued on flecainide  100 mg twice daily for paroxysmal atrial fibrillation.  He was not able to tolerate verapamil  due to low heart rate.  He reported compliance with warfarin.  His blood pressure was well-controlled.  He had lost 10-12 pounds.  He noted anxiety particularly at night.  He was taking medication for this.  He was having difficulty sleeping and  achieving only 4-1/2 hours of sleep per night.  He was using his BiPAP he was feeling good upon waking up.  His EKG at that time showed sinus rhythm with first-degree AV block 64 bpm QTc 418.  He contacted the nurse triage line today (02/05/2024).  He stated that he noted irregular heartbeat after switching from Paxil  to Lexapro.  He was added to my schedule.  He presents to the clinic today for evaluation and states he recently noticed he was having a lower heart rate after switching from Paxil  to Lexapro.  He was concerned about this.  He reported that he takes his medication in the morning.  In the afternoon he occasionally notices increased sleepiness, blurry vision, and a fuzziness in his eyes.  His blood pressure at home is routinely in the 120s over 70s.  In the clinic today it is 144/82 and on recheck it is 135/86.  His EKG is reassuring.  We reviewed this.  He notes that he has been on his Lexapro for a little bit less than a month.  He is also taking Ambien to help with sleep.  He has been able to fall asleep around midnight but is waking up at 4 AM.  I reassured him that his EKG had normal intervals.  I encouraged him to follow-up with neurology to discuss his SSRI.  He and his wife expressed understanding.  I will  give him the sleep hygiene instructions and plan follow-up in 6 to 9 months.  Today he denies chest pain, shortness of breath, lower extremity edema, fatigue, palpitations, melena, hematuria, hemoptysis, diaphoresis, weakness, presyncope, syncope, orthopnea, and PND.   Home Medications    Prior to Admission medications   Medication Sig Start Date End Date Taking? Authorizing Provider  allopurinol (ZYLOPRIM) 100 MG tablet Take 100 mg by mouth daily.    [provider]  Cholecalciferol (VITAMIN D3) 50 MCG (2000 UT) TABS Take 2,000 Units by mouth daily.    [provider]  escitalopram (LEXAPRO) 10 MG tablet Take 1 tablet (10 mg total) by mouth daily. 01/10/24    Dohmeier, Dedra, MD  flecainide  (TAMBOCOR ) 100 MG tablet TAKE 1 TABLET(100 MG) BY MOUTH TWICE DAILY 11/09/23   Ladona Heinz, MD  hydrALAZINE  (APRESOLINE ) 50 MG tablet Take 1 tablet (50 mg total) by mouth 3 (three) times daily. 08/27/23   Ladona Heinz, MD  omeprazole (PRILOSEC) 40 MG capsule Take 40 mg by mouth daily. 05/14/23   [provider]  Suvorexant (BELSOMRA) 20 MG TABS Take 20 mg by mouth at bedtime. Patient not taking: Reported on 01/10/2024    [provider]  telmisartan (MICARDIS) 80 MG tablet Take 80 mg by mouth at bedtime.     [provider]  warfarin (COUMADIN ) 5 MG tablet Take 5 mg by mouth at bedtime.     [provider]  zolpidem (AMBIEN) 10 MG tablet Take 5 mg by mouth at bedtime as needed for sleep (do not exceed 4 nights a week). Prn 5 mg hs 12/18/23   [provider]    Family History    Family History  Problem Relation Age of Onset   Alzheimer's disease Mother    Sleep apnea Neg Hx    He indicated that his mother is deceased. He indicated that his father is deceased. He indicated that his sister is deceased. He indicated that his brother is alive. He indicated that the status of his neg hx is unknown.  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   Patient has two children.   Patient is right-handed.   Patient drinks one cup of coffee daily.      Social Drivers of Corporate Investment Banker  Strain: Not on file  Food Insecurity: Not on file  Transportation Needs: Not on file  Physical Activity: Not on file  Stress: Not on file  Social Connections: Unknown (08/08/2021)   Received from [facility]   Social Network    Social Network: Not on file  Intimate Partner Violence: Unknown (08/01/2021)   Received from [facility]   HITS    Physically Hurt: Not on file    Insult or Talk Down To: Not on file    Threaten Physical Harm: Not on file  Scream or Curse: Not on file     Review of Systems    General:  No chills, fever, night sweats or weight changes.  Cardiovascular:  No chest pain, dyspnea on exertion, edema, orthopnea, palpitations, paroxysmal nocturnal dyspnea. Dermatological: No rash, lesions/masses Respiratory: No cough, dyspnea Urologic: No hematuria, dysuria Abdominal:   No nausea, vomiting, diarrhea, bright red blood per rectum, melena, or hematemesis Neurologic:  No visual changes, wkns, changes in mental status. All other systems reviewed and are otherwise negative except as noted above.  Physical Exam    VS:  BP 135/86   Pulse (!) 51   Ht 5' 9 (1.753 m)   Wt 267 lb 12.8 oz (121.5 kg)   SpO2 96%   BMI 39.55 kg/m  , BMI Body mass index is 39.55 kg/m. GEN: Well nourished, well developed, in no acute distress. HEENT: normal. Neck: Supple, no JVD, carotid bruits, or masses. Cardiac: RRR, no murmurs, rubs, or gallops. No clubbing, cyanosis, edema.  Radials/DP/PT 2+ and equal bilaterally.  Respiratory:  Respirations regular and unlabored, clear to auscultation bilaterally. GI: Soft, nontender, nondistended, BS + x 4. MS: no deformity or atrophy. Skin: warm and dry, no rash. Neuro:  Strength and sensation are intact. Psych: Normal affect.  Accessory Clinical Findings    Recent Labs: No results found for requested labs within last 365 days.   Recent Lipid Panel No results found for: CHOL, TRIG, HDL, CHOLHDL, VLDL, LDLCALC,  LDLDIRECT       ECG personally reviewed by me today- EKG Interpretation Date/Time:  Tuesday February 05 2024 15:10:21 EDT Ventricular Rate:  51 PR Interval:  254 QRS Duration:  98 QT Interval:  424 QTC Calculation: 390 R Axis:   -18  Text Interpretation: Sinus bradycardia with 1st degree A-V block Abnormal QRS-T angle, consider primary T wave abnormality When compared with ECG of 09-Aug-2023 14:52, No significant change was found Confirmed by Emelia Hazy [phone]) on 02/05/2024 3:33:45 PM    Cardiac event monitor 03/13/2022  Zio Patch Extended out patient EKG monitoring 7 days starting 02/27/2022: Predominant Rhythm :Sinus min HR: 39. Max HR 140 bpm Atrial arrhythmias:               Occasional PACs and nonconducted PACs Atrial fibrillation:                  None Ventricular arrhythmias:      Occasional PVCs. PVC Burden <1% Heart Block:                        None Symptoms:                          Correlated with PVCs.     Gordy Bergamo, MD, [facility] 03/18/2022, 9:34 AM     Assessment & Plan   1.  Paroxysmal atrial fibrillation, irregular heartbeat-EKG today shows sinus bradycardia first-degree AV block 51 bpm.  Denies episodes of lightheadedness, presyncope or syncope.  Notes having heart rate in the 50s after changing from Paxil  to Lexapro.  We reviewed his previous heart rates.  He was noted to have a pulse of 59 08/09/2023. Continue Coumadin , flecainide , metoprolol  Heart healthy low-sodium diet Avoid triggers caffeine, chocolate, EtOH, dehydration excetra.   Essential hypertension-BP today initially 144/82 and on recheck 135/86. Maintain blood pressure log Continue telmisartan, hydralazine , metoprolol  Heart healthy low-sodium diet Increase physical activity as tolerated  OSA-reports compliance with BiPAP.   Continue  BiPAP use Continue weight loss Sleep hygiene instructions provided  Anxiety-following with psychologist.   Transitioning to Lexapro from Paxil .  QT  390. Continue to follow with psychologist, neurology  Disposition: Follow-up with Dr. Ladona or me in 6-9 months.   Josefa HERO. Terressa Evola NP-C     02/05/2024, 4:37 PM [facility] [address] [place], [place] [address] Office [phone]    Notice: This dictation was prepared with Dragon dictation along with smaller phrase technology. Any transcriptional errors that result from this process are unintentional and may not be corrected upon review.   I spent 14 minutes examining this patient, reviewing medications, and using patient centered shared decision making involving their cardiac care.   I spent  20 minutes reviewing past medical history,  medications, and prior cardiac tests.

## 2024-02-05 NOTE — Patient Instructions (Addendum)
 Medication Instructions:  Your physician recommends that you continue on your current medications as directed. Please refer to the Current Medication list given to you today.  *If you need a refill on your cardiac medications before your next appointment, please call your pharmacy*   Follow-Up: Follow up with Neurology   Your next appointment:   6-9 month(s)  Provider:   Gordy Bergamo, MD or Josefa Beauvais, NP          We recommend signing up for the patient portal called MyChart.  Sign up information is provided on this After Visit Summary.  MyChart is used to connect with patients for Virtual Visits (Telemedicine).  Patients are able to view lab/test results, encounter notes, upcoming appointments, etc.  Non-urgent messages can be sent to your provider as well.   To learn more about what you can do with MyChart, go to [email or web address].   Other Instructions Sleep Hygiene   Sleep hygiene refers to a set of practices and habits that promote quality sleep. It involves creating a consistent sleep schedule, establishing a relaxing bedtime routine, and maintaining a comfortable sleep environment.  Key Principles of Sleep Hygiene: Regular Sleep Schedule: Go to bed and wake up at the same time each day, even on weekends.  Bedtime Routine: Engage in calming activities before bed, such as taking a warm bath, reading, or listening to relaxing music.  Comfortable Sleep Environment: Ensure your bedroom is dark, quiet, and cool.  Avoid Stimulants Before Bed: Limit caffeine and alcohol intake several hours before bedtime.  Exercise Regularly: Engage in physical activity earlier in the day, but avoid strenuous exercise close to bedtime.  Limit Screen Time Before Bed: Turn off electronic devices at least 30 minutes before sleep.  Avoid Large Meals Before Bed: Don't eat heavy or spicy foods before going to sleep.  Get Enough Sunlight During the Day: Exposure to natural sunlight helps regulate your  body's sleep-wake cycle.  Limit Naps: If you need to nap, keep them short (less than 30 minutes) and avoid napping late in the afternoon or evening.  Benefits of Good Sleep Hygiene:  Improved sleep quality Increased alertness and daytime functioning Reduced risk of chronic diseases, such as obesity, heart disease, and diabetes Improved mood and mental well-being Additional Tips:  Consider using a sleep tracker or app to monitor your sleep patterns.  If you have persistent sleep problems, consult a healthcare professional for evaluation and treatment options. Avoid using sleeping pills without consulting a doctor.  By following these sleep hygiene practices, you can create a healthy sleep routine that promotes quality rest and overall well-being.

## 2024-02-07 ENCOUNTER — Telehealth: Payer: Self-pay | Admitting: Neurology

## 2024-02-07 DIAGNOSIS — I480 Paroxysmal atrial fibrillation: Secondary | ICD-10-CM

## 2024-02-07 DIAGNOSIS — G4733 Obstructive sleep apnea (adult) (pediatric): Secondary | ICD-10-CM

## 2024-02-07 MED ORDER — PAROXETINE HCL 20 MG PO TABS: 10.0000 mg | ORAL_TABLET | Freq: Every day | ORAL | 2 refills | Status: DC

## 2024-02-07 NOTE — Addendum Note (Signed)
 Addended by: CHALICE SAUNAS on: 02/07/2024 05:26 PM   Modules accepted: Orders

## 2024-02-07 NOTE — Telephone Encounter (Signed)
 I called pt back.  He relayed that he is on lexapro 10mg  po daily for anxiety depression.  States that since he started taking this, he feels his HR decreased, (50's) having head fogginess, not able to focus. He is asking for appt to address today or tomorrow.  Has seen Cardiology 02-05-2024 (in epic).   I relayed that will send message to Dr. Chalice. I unfortunately do not have any openings available on an acute basis.  He may see pcp to address as well.

## 2024-02-07 NOTE — Telephone Encounter (Signed)
 Pt called needing to speak to the nurse regarding reactions he is having to the  escitalopram (LEXAPRO) 10 MG tablet Pt states that a week after he started the medication he felt his heart rate lowered, he started not being able to sleep and became very anxious. Pt would like to discuss a change of medication.

## 2024-02-07 NOTE — Telephone Encounter (Signed)
 Phone all to patient ; he will stop the lexapro and restart Paxil .  Rv  planned for [facility] February

## 2024-02-11 ENCOUNTER — Ambulatory Visit: Admitting: Psychology

## 2024-02-11 DIAGNOSIS — F32A Depression, unspecified: Secondary | ICD-10-CM

## 2024-02-11 NOTE — Progress Notes (Signed)
 [facility] Counselor Initial Adult Exam  Name: Ricky Burton AGE Date: 02/11/[birth year]24 [name] DOB: 03-28-1961 PCP: Valma Carwin, MD  Time Spent: 10:00  am - 10:55 am : 55 Minutes  Guardian/Payee:  Self    Paperwork requested: No   Reason for Visit /Presenting Problem: Depression, grief,  Mental Status Exam: Appearance:   Casual     Behavior:  Appropriate  Motor:  Normal  Speech/Language:   Normal Rate  Affect:  Congruent  Mood:  dysthymic  Thought process:  normal  Thought content:    WNL  Sensory/Perceptual disturbances:    WNL  Orientation:  oriented to person, place, time/date, and situation  Attention:  Good  Concentration:  Good  Memory:  WNL  Fund of knowledge:   Good  Insight:    Good  Judgment:   Good  Impulse Control:  Good   Reported Symptoms:  Depression, anxiety.   Risk Assessment: Danger to Self:  No Self-injurious Behavior: No Danger to Others: No Duty to Warn:no Physical Aggression / Violence:No  Access to Firearms a concern: No  Gang Involvement:No  Patient / guardian was educated about steps to take if suicide or homicide risk level increases between visits: n/a While future psychiatric events cannot be accurately predicted, the patient does not currently require acute inpatient psychiatric care and does not currently meet [place]  involuntary commitment criteria.  Substance Abuse History: Current substance abuse: No     Caffeine: 1x coffee in the AM.  Tobacco: denied.  Alcohol: None Substance use: infrequent.   Mom drank a lot when I was younger.   Past Psychiatric History:   Previous psychological history is significant for depression Outpatient Providers: Attempted therapy ~ 8 years ago for one session.  History of Psych Hospitalization: No  Psychological Testing: NA  Family history: Father during latter years.    Abuse History:  Victim of: No., NA   Report needed: No. Victim of Neglect:No. Perpetrator  of NA  Witness / Exposure to Domestic Violence: No   Protective Services Involvement: No  Witness to [employer] Violence:  No   Family History:  Family History  Problem Relation Age of Onset   Alzheimer's disease Mother    Sleep apnea Neg Hx     Living situation: the patient lives with their spouse  Sexual Orientation: Straight  Relationship Status: married  Name of spouse / other: Belinda (26) If a parent, number of children / ages: Estate Agent (16)  Support Systems: Friends: India Flores.   Financial Stress:  Yes , would love to have more to do more.   Income/Employment/Disability: SS and pension from [employer] ([place]). Wife has SS, pension, and retirement from [employer].   Military Service: No   Educational History: Education: some college (accounting)  Religion/Sprituality/World View: Christian  Any cultural differences that may affect / interfere with treatment:  not applicable   Recreation/Hobbies: Golf, Engineer, Petroleum, Social media, reading bible, playing saxophone.   Stressors: Financial difficulties   Health problems   Other: Grief and loss. Difficulty finding stuff to do.    Strengths: Supportive Relationships, Family, Friends, Church, Hopefulness, Journalist, Newspaper, and Able to Communicate Effectively  Barriers:  Mood   Legal History: Pending legal issue / charges: The patient has no significant history of legal issues. History of legal issue / charges: NA  Medical History/Surgical History: reviewed Past Medical History:  Diagnosis Date   A-fib (HCC)    Anxiety    Heart disease    High cholesterol  controlled   Hypertension    Morbid obesity (HCC)    OSA (obstructive sleep apnea)    Paroxysmal atrial fibrillation (HCC) 06/01/2010   Qualifier: Diagnosis of   By: Fernande, MD, CODY Elspeth Darner      Primary hypertension 05/07/2016   Stroke (HCC)    L sided in 2001    Past Surgical History:  Procedure Laterality Date   COLONOSCOPY WITH PROPOFOL  N/A 02/20/2020    Procedure: COLONOSCOPY WITH PROPOFOL ;  Surgeon: Rollin Dover, MD;  Location: [facility];  Service: Endoscopy;  Laterality: N/A;   HERNIA REPAIR  1986   POLYPECTOMY  02/20/2020   Procedure: POLYPECTOMY;  Surgeon: Rollin Dover, MD;  Location: [facility];  Service: Endoscopy;;    Medications: Current Outpatient Medications  Medication Sig Dispense Refill   allopurinol (ZYLOPRIM) 100 MG tablet Take 100 mg by mouth daily.     Cholecalciferol (VITAMIN D3) 50 MCG (2000 UT) TABS Take 2,000 Units by mouth daily.     flecainide  (TAMBOCOR ) 100 MG tablet TAKE 1 TABLET(100 MG) BY MOUTH TWICE DAILY 180 tablet 2   hydrALAZINE  (APRESOLINE ) 50 MG tablet Take 1 tablet (50 mg total) by mouth 3 (three) times daily. 270 tablet 3   omeprazole (PRILOSEC) 40 MG capsule Take 40 mg by mouth daily.     PARoxetine  (PAXIL ) 20 MG tablet Take 0.5 tablets (10 mg total) by mouth daily. 15 tablet 2   Suvorexant (BELSOMRA) 20 MG TABS Take 20 mg by mouth at bedtime.     telmisartan (MICARDIS) 80 MG tablet Take 80 mg by mouth at bedtime.      warfarin (COUMADIN ) 5 MG tablet Take 5 mg by mouth at bedtime.      zolpidem (AMBIEN) 10 MG tablet Take 5 mg by mouth at bedtime as needed for sleep (do not exceed 4 nights a week). Prn 5 mg hs     No current facility-administered medications for this visit.    Allergies  Allergen Reactions   Advair Diskus [Fluticasone-Salmeterol] Palpitations    Diagnoses:  Depression, unspecified depression type  Psychiatric Treatment: Yes , by neurologist. See chart for details.   Plan of Care: OPT and ?psych.   Narrative:  Dessa JAYSON Hahn participated from office with therapist and consented to treatment. We reviewed the limits of confidentiality prior to the start of the evaluation. Ceejay C Koepke expressed understanding and agreement to proceed. [name] was referred by his neurologist due to anxiety and anger. His chart reflects a diagnosis of depression. He noted realizing his  mood shifting, during the winter time, and noted this affecting his heart rate as well. He has a history of being prescribed Paxil  since his stroke and noted staying on the same dosage for a length period of time. He was recently taken off of Paxil  and started Lexapro and noted various negative side-effects. He noted taking his meds for 23 days and provided feedback to his prescriber who discontinued the Lexapro and restarting Lexapro. He noted continuing to experience middle insomnia and gets ~5 hours of sleep. He noted generally waking around ~4:30 in the AM. He endorsed rumination upon waking. He noted being prescribed Ambien and noted continued disturbed sleep. He noted attempting Melatonin and noted continued disturbed sleep. He noted the most recent loss of his dog after 14 years ~3 weeks ago. He noted a lack of socialization, while in [place], due to his role as a caretaker. Adopted from birth by adoptive parents. Noted that his paternal aunt putting [name] up  for adoption. He has experienced numerous losses including his parents, in-laws, and most recently, his dog ~ 3 weeks ago. He noted his MIL passing of alzheimer's on his own wedding anniversary 2 years ago. 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 He noted the majority of his time in here ([place]) has been a caregiver. He noted being wrapped up in the role. He then moved his sister in, who had cancer, and later passed. He noted having a strained relationship with his daughter who is 19 years old. He noted that she is well education and has a event organiser. He noted that she dates bad guys. He noted that she keeps them away from me. He noted, generally, having difficulty saying no and noted I always felt like I owe somebody. He would benefit from counseling to address mood, process past events, verbalize thoughts and feelings, process losses,  manage symptoms, develop new role after role of caretaker, and engage in consistent self-care. His symptoms of anxiety include  feeling anxious, difficulty managing worry, worrying too much about different things, trouble relaxing, restlessness, irritability, and feeling afraid something awful might happen. His symptoms of depression including loss of interest, feeling down, middle insomnia (rumination), feeling bad about self, difficulty concentrating, poor appetite (with Lexapro), psychomotor agitation. A follow-up was scheduled to create a treatment plan and begin treatment. Therapist answered  and all questions during the evaluation and contact information was provided.    Elvie Mullet, LCSW

## 2024-02-25 ENCOUNTER — Ambulatory Visit: Admitting: Psychology

## 2024-02-25 DIAGNOSIS — F32A Depression, unspecified: Secondary | ICD-10-CM | POA: Diagnosis not present

## 2024-02-25 NOTE — Progress Notes (Signed)
 [facility] Counselor/Therapist Progress Note  Patient ID: Ricky Burton, [name]    Date: 02/25/24  Time Spent: 8:05  am - 9:04 am : 59 Minutes  Treatment Type: Individual Therapy.  Reported Symptoms: Depression  Mental Status Exam: Appearance:  Casual     Behavior: Appropriate  Motor: Normal  Speech/Language:  Normal Rate  Affect: Congruent  Mood: dysthymic  Thought process: normal  Thought content:   WNL  Sensory/Perceptual disturbances:   WNL  Orientation: oriented to person, place, time/date, and situation  Attention: Good  Concentration: Good  Memory: WNL  Fund of knowledge:  Good  Insight:   Good  Judgment:  Good  Impulse Control: Good   Risk Assessment: Danger to Self:  No Self-injurious Behavior: No Danger to Others: No Duty to Warn:no Physical Aggression / Violence:No  Access to Firearms a concern: No  Gang Involvement:No   Subjective:   Elijio C Dotson participated in the session, in person in the office with the therapist, and consented to treatment Tyner reviewed the events of the past week. We reviewed numerous treatment approaches including CBT, BA, Problem Solving, and Solution focused therapy. Psych-education regarding the Roc's diagnosis of Depression, unspecified depression type was provided during the session. We discussed Conn C Wilz's goals treatment goals which include would like to work on manage his mood, maintain consistency with socializing, improve attention, increase motivation, improve distress management, reduce fog, manage physical symptoms of distress, increase mindfulness, manage frustration, rumination. Mackie C Koranda provided verbal approval of the treatment plan.   [name] noted typically getting 5 hours of sleep on average. He noted often forcing self to stay up ~2 hours in hopes that he doesn't wake up in the middle of the night. He endorsed cognitive distortions including jumping conclusions.  Therapist highlighted consistent negative self-talk and unmeetable expectations.     Interventions: Psycho-education & Goal Setting.   Diagnosis:   Depression, unspecified depression type  Psychiatric Treatment: Yes , via PCP. [name], MD)  Behavioral Health Treatment Plan   Name:Dewain CHRISS MANNAN   [name]   Treatment Plan Development Date: 02/25/2024  Strengths: Family, Friends, Church, Spirituality, Hopefulness, Journalist, Newspaper, and Able to Communicate Effectively  Supports: Spouse and Friends   Theatre Manager of Needs: Keston would like to work on manage his mood, maintain consistency with socializing, improve attention, increase motivation, improve distress management, reduce fog, manage physical symptoms of distress, increase mindfulness, manage frustration, rumination.    Treatment Level: Individual Therapy and continued medication management.  Client Treatment Preferences: Outpatient Therapy.    Diagnosis Depressive Disorders  Depression, unspecified depression type   Symptoms:  Rumination, Depressed mood-indicated by subjective report or observation by others (in children and adolescents, can be irritable mood)., Loss of interest or pleasure in almost all activities-indicated by subjective report or observation by others., Significant (more than 5 percent in a month) unintentional weight loss/gain or decrease/increase in appetite (in children, failure to make expected weight gains)., Sleep disturbance (insomnia or hypersomnia)., Psychomotor changes (agitation or retardation) severe enough to be observable by others., and Impaired ability to think, concentrate, or make decisions-indicated by subjective report or observation by others.  Goals:  Alleviate depressive symptoms to return to effective functioning., Recognize, accept, and cope with depressed feelings., and Develop healthy thinking and beliefs about self, others, and the world to alleviate  and prevent relapse.  Objectives: Target Date For All Objectives: 02/24/25  Identify and replace thoughts and beliefs that support depression., Learn and  implement strategies to overcome depression., Learn and implement problem-solving., Learn and implement conflict resolution skills., Learn and implement relapse prevention skills., and Verbalize an understanding of healthy and unhealthy emotions and increase the use of healthy emotions.  Progress Documentation:  Progressing  Interventions:  Cognitive Behavioral Therapy, Assertiveness/Communication, Mindfulness Meditation, Psycho-education/Bibliotherapy, and Interpersonal   Expected duration of treatment: Evaluate after 1 year of treatment  Party responsible for implementation of interventions: The patient, Cashius C Dehaan and Therapist, [employer], LCSW.  This plan has been reviewed and created by the following participants: The patient, Orvel C Gallaher and Therapist, [employer], LCSW.  This plan will be reviewed at least every 12 months.  Status of Treatment Plan Signature:  No, pending signature via MyChart.  Signature:  Elvie Mullet, LCSW

## 2024-02-25 NOTE — Progress Notes (Signed)
 Behavioral Health Treatment Plan   [name]   [name]   Treatment Plan Development Date: 02/25/2024  Strengths: Family, Friends, Church, Spirituality, Hopefulness, Journalist, Newspaper, and Able to Communicate Effectively  Supports: Spouse and Friends   Theatre Manager of Needs: Ricky Burton would like to work on manage his mood, maintain consistency with socializing, improve attention, increase motivation, improve distress management, reduce fog, manage physical symptoms of distress, increase mindfulness, manage frustration, rumination.    Treatment Level: Individual Therapy and continued medication management.  Client Treatment Preferences: Outpatient Therapy.    Diagnosis Depressive Disorders  Depression, unspecified depression type   Symptoms:  Rumination, Depressed mood-indicated by subjective report or observation by others (in children and adolescents, can be irritable mood)., Loss of interest or pleasure in almost all activities-indicated by subjective report or observation by others., Significant (more than 5 percent in a month) unintentional weight loss/gain or decrease/increase in appetite (in children, failure to make expected weight gains)., Sleep disturbance (insomnia or hypersomnia)., Psychomotor changes (agitation or retardation) severe enough to be observable by others., and Impaired ability to think, concentrate, or make decisions-indicated by subjective report or observation by others.  Goals:  Alleviate depressive symptoms to return to effective functioning., Recognize, accept, and cope with depressed feelings., and Develop healthy thinking and beliefs about self, others, and the world to alleviate and prevent relapse.  Objectives: Target Date For All Objectives: 02/24/25  Identify and replace thoughts and beliefs that support depression., Learn and implement strategies to overcome depression., Learn and implement problem-solving., Learn and  implement conflict resolution skills., Learn and implement relapse prevention skills., and Verbalize an understanding of healthy and unhealthy emotions and increase the use of healthy emotions.  Progress Documentation:  Progressing  Interventions:  Cognitive Behavioral Therapy, Assertiveness/Communication, Mindfulness Meditation, Psycho-education/Bibliotherapy, and Interpersonal   Expected duration of treatment: Evaluate after 1 year of treatment  Party responsible for implementation of interventions: The patient, Ricky Burton and Therapist, [employer], LCSW.  This plan has been reviewed and created by the following participants: The patient, Ricky Burton and Therapist, [employer], LCSW.  This plan will be reviewed at least every 12 months.  Status of Treatment Plan Signature:  No, pending signature via MyChart.  Signature:  Elvie Mullet, LCSW

## 2024-02-26 ENCOUNTER — Encounter: Payer: Self-pay | Admitting: Psychology

## 2024-03-03 ENCOUNTER — Telehealth: Payer: Self-pay | Admitting: Cardiology

## 2024-03-03 NOTE — Telephone Encounter (Signed)
 Spoke to patient's wife she stated husband has been feeling bad for the past 2 weeks.Heart rate up and down,ranging 46 to 80. No chest pain. No sob.Appointment scheduled with Dr.Ganji 12/4 at 3:00 pm.

## 2024-03-03 NOTE — Telephone Encounter (Signed)
 STAT if HR is under 50 or over 120  (normal HR is 60-100 beats per minute)  What is your heart rate?  130/82 80 a few minutes ago per wife.  Do you have a log of your heart rate readings (document readings)?   46 at rest this morning  Do you have any other symptoms?  Eyes are heavy, just feels bad

## 2024-03-04 ENCOUNTER — Ambulatory Visit (INDEPENDENT_AMBULATORY_CARE_PROVIDER_SITE_OTHER): Admitting: Psychology

## 2024-03-04 DIAGNOSIS — F32A Depression, unspecified: Secondary | ICD-10-CM

## 2024-03-04 NOTE — Progress Notes (Signed)
 [facility] Counselor/Therapist Progress Note  Patient ID: Ricky Burton, [name]    Date: 03/04/24  Time Spent: 9:02  am - 9:59 am : 57 Minutes  Treatment Type: Individual Therapy.  Reported Symptoms: Depression  Mental Status Exam: Appearance:  Casual     Behavior: Appropriate  Motor: Normal  Speech/Language:  Normal Rate  Affect: Congruent  Mood: dysthymic  Thought process: normal  Thought content:   WNL  Sensory/Perceptual disturbances:   WNL  Orientation: oriented to person, place, time/date, and situation  Attention: Good  Concentration: Good  Memory: WNL  Fund of knowledge:  Good  Insight:   Good  Judgment:  Good  Impulse Control: Good   Risk Assessment: Danger to Self:  No Self-injurious Behavior: No Danger to Others: No Duty to Warn:no Physical Aggression / Violence:No  Access to Firearms a concern: No  Gang Involvement:No   Subjective:   Ricky Burton participated in the session, in person in the office with the therapist, and consented to treatment Kelvis reviewed the events of the past week. Ricky Burton noted continuing to wake up ~4.5 hours of sleep. He noted suspecting his low heart rate playing a part and noted a generally feeling bad during that time. He noted communicating this concern to the cardiologist and is slated to meet on December 4th. He noted his worry about his heart can often result in increased worry in other areas. He noted a low reading resulting in anxiety and that he can be obsessive about checking his heart rate. He noted recently taking off his smart-watch, which provides a readout for heart-rate. He noted exercising, getting out of the house, making myself do something, reading for church class. He noted healthy distractions being help. We worked on differentiating between distractions verses building a rhythm to the day. We discussed the importance and benefit of both. Therapist provided psycho-education  regarding anxiety and worry. Therapist introduced and reviewed developing worry time and discussed the benefits of. Therapist modeled this during the session. Ricky Burton noted being prescribed rexalti and is titrating to a 1mg  dose. [name] during the session. Therapist praised [name] for his effort during the session and his willingness to attempt various tools and his commitment to work on building a daily rhythm and routine. A follow-up was scheduled for continued treatment, which Ricky Burton benefits from.   Interventions: CBT & Psycho-education  Diagnosis:   Depression, unspecified depression type  Psychiatric Treatment: Yes , via PCP. Ricky Carwin, MD)  Behavioral Health Treatment Plan   [name]   [name]   Treatment Plan Development Date: 02/25/2024  Strengths: Family, Friends, Church, Spirituality, Hopefulness, Journalist, Newspaper, and Able to Communicate Effectively  Supports: Spouse and Friends   Theatre Manager of Needs: Ricky Burton would like to work on manage his mood, maintain consistency with socializing, improve attention, increase motivation, improve distress management, reduce fog, manage physical symptoms of distress, increase mindfulness, manage frustration, rumination.    Treatment Level: Individual Therapy and continued medication management.  Client Treatment Preferences: Outpatient Therapy.    Diagnosis Depressive Disorders  Depression, unspecified depression type   Symptoms:  Rumination, Depressed mood-indicated by subjective report or observation by others (in children and adolescents, can be irritable mood)., Loss of interest or pleasure in almost all activities-indicated by subjective report or observation by others., Significant (more than 5 percent in a month) unintentional weight loss/gain or decrease/increase in appetite (in children, failure to make expected weight gains)., Sleep disturbance (  insomnia or  hypersomnia)., Psychomotor changes (agitation or retardation) severe enough to be observable by others., and Impaired ability to think, concentrate, or make decisions-indicated by subjective report or observation by others.  Goals:  Alleviate depressive symptoms to return to effective functioning., Recognize, accept, and cope with depressed feelings., and Develop healthy thinking and beliefs about self, others, and the world to alleviate and prevent relapse.  Objectives: Target Date For All Objectives: 02/24/25  Identify and replace thoughts and beliefs that support depression., Learn and implement strategies to overcome depression., Learn and implement problem-solving., Learn and implement conflict resolution skills., Learn and implement relapse prevention skills., and Verbalize an understanding of healthy and unhealthy emotions and increase the use of healthy emotions.  Progress Documentation:  Progressing  Interventions:  Cognitive Behavioral Therapy, Assertiveness/Communication, Mindfulness Meditation, Psycho-education/Bibliotherapy, and Interpersonal   Expected duration of treatment: Evaluate after 1 year of treatment  Party responsible for implementation of interventions: The patient, Chase C Labrie and Therapist, [employer], LCSW.  This plan has been reviewed and created by the following participants: The patient, Ricky Burton and Therapist, [employer], LCSW.  This plan will be reviewed at least every 12 months.  Status of Treatment Plan Signature:  No, pending signature via MyChart.  Signature:  Elvie Mullet, LCSW

## 2024-03-04 NOTE — Progress Notes (Signed)
 Behavioral Health Treatment Plan   [name]   [name]   Treatment Plan Development Date: 02/25/2024  Strengths: Family, Friends, Church, Spirituality, Hopefulness, Journalist, Newspaper, and Able to Communicate Effectively  Supports: Spouse and Friends   Theatre Manager of Needs: [name] would like to work on manage his mood, maintain consistency with socializing, improve attention, increase motivation, improve distress management, reduce fog, manage physical symptoms of distress, increase mindfulness, manage frustration, rumination.    Treatment Level: Individual Therapy and continued medication management.  Client Treatment Preferences: Outpatient Therapy.    Diagnosis Depressive Disorders  Depression, unspecified depression type   Symptoms:  Rumination, Depressed mood-indicated by subjective report or observation by others (in children and adolescents, can be irritable mood)., Loss of interest or pleasure in almost all activities-indicated by subjective report or observation by others., Significant (more than 5 percent in a month) unintentional weight loss/gain or decrease/increase in appetite (in children, failure to make expected weight gains)., Sleep disturbance (insomnia or hypersomnia)., Psychomotor changes (agitation or retardation) severe enough to be observable by others., and Impaired ability to think, concentrate, or make decisions-indicated by subjective report or observation by others.  Goals:  Alleviate depressive symptoms to return to effective functioning., Recognize, accept, and cope with depressed feelings., and Develop healthy thinking and beliefs about self, others, and the world to alleviate and prevent relapse.  Objectives: Target Date For All Objectives: 02/24/25  Identify and replace thoughts and beliefs that support depression., Learn and implement strategies to overcome depression., Learn and implement problem-solving., Learn and  implement conflict resolution skills., Learn and implement relapse prevention skills., and Verbalize an understanding of healthy and unhealthy emotions and increase the use of healthy emotions.  Progress Documentation:  Progressing  Interventions:  Cognitive Behavioral Therapy, Assertiveness/Communication, Mindfulness Meditation, Psycho-education/Bibliotherapy, and Interpersonal   Expected duration of treatment: Evaluate after 1 year of treatment  Party responsible for implementation of interventions: The patient, [name] and Therapist, [employer], LCSW.  This plan has been reviewed and created by the following participants: The patient, [name] and Therapist, [employer], LCSW.  This plan will be reviewed at least every 12 months.  Status of Treatment Plan Signature:  No, pending signature via MyChart.  Signature:  Elvie Mullet, LCSW

## 2024-03-13 ENCOUNTER — Encounter: Payer: Self-pay | Admitting: Cardiology

## 2024-03-13 ENCOUNTER — Ambulatory Visit: Attending: Cardiology | Admitting: Cardiology

## 2024-03-13 VITALS — BP 159/94 | HR 61 | Resp 16 | Ht 69.0 in | Wt 269.1 lb

## 2024-03-13 DIAGNOSIS — I480 Paroxysmal atrial fibrillation: Secondary | ICD-10-CM | POA: Diagnosis not present

## 2024-03-13 DIAGNOSIS — F419 Anxiety disorder, unspecified: Secondary | ICD-10-CM | POA: Diagnosis not present

## 2024-03-13 DIAGNOSIS — I10 Essential (primary) hypertension: Secondary | ICD-10-CM | POA: Insufficient documentation

## 2024-03-13 DIAGNOSIS — G4733 Obstructive sleep apnea (adult) (pediatric): Secondary | ICD-10-CM | POA: Diagnosis not present

## 2024-03-13 NOTE — Patient Instructions (Signed)
 Medication Instructions:  Your physician recommends that you continue on your current medications as directed. Please refer to the Current Medication list given to you today.  *If you need a refill on your cardiac medications before your next appointment, please call your pharmacy*  Lab Work: None.  If you have labs (blood work) drawn today and your tests are completely normal, you will receive your results only by: MyChart Message (if you have MyChart) OR A paper copy in the mail If you have any lab test that is abnormal or we need to change your treatment, we will call you to review the results.  Testing/Procedures: None.  Follow-Up: At [facility], you and your health needs are our priority.  As part of our continuing mission to provide you with exceptional heart care, our providers are all part of one team.  This team includes your primary Cardiologist (physician) and Advanced Practice Providers or APPs (Physician Assistants and Nurse Practitioners) who all work together to provide you with the care you need, when you need it.  Your next appointment:   1 year(s)  Provider:   Gordy Bergamo, MD

## 2024-03-13 NOTE — Progress Notes (Signed)
 Cardiology Office Note:  .   Date:  03/13/2024  ID:  Ricky, Burton 1961/01/17, MRN [id number] PCP: Valma Carwin, MD  [facility] Providers Cardiologist:  Gordy Bergamo, MD Cardiology APP:  Emelia Josefa HERO, NP   History of Present Illness: .   Ricky Burton is a 63 y.o. AA male with history of hypertension with CKD stage III, hyperlipidemia, morbid obesity with obstructive sleep apnea on BiPAP, and history of CVA in 2001. Also has history of atrial flutter status post ablation in 2003 in [place] , Paroxysmal A. Fib and on Coumadin .   His last nuclear stress test on 12/09/2018 had revealed diaphragmatic continuation artifact in the inferior wall and LVEF at 48% considered low risk and echocardiogram from 12/09/2021.  Normal LV systolic function with moderate LVH.    He presents for follow-up, states that he was about to cancel the appointment as he is feeling well and has not had any further palpitations.  However he wanted to keep up the appointment to discuss medication changes.  He just returned from a 3-day trip to [place]  and admits to eating poorly and has now gout in his left ankle and blood pressure has been elevated.    Discussed the use of AI scribe software for clinical note transcription with the patient, who gave verbal consent to proceed.  History of Present Illness Ricky Burton is a 63 year old male with paroxysmal atrial fibrillation who presents with concerns about low heart rate and anxiety management.  He has episodic bradycardia with heart rates dropping to 47-48 bpm associated with significant discomfort. His usual resting heart rate is about 53 bpm by smartwatch, which he plans to stop using because it worsens his anxiety. He takes flecainide  100 mg twice daily, which may be contributing to the low heart rate.  He has anxiety and depression that can heighten his awareness of physical symptoms. He started a new anxiety medication 10 days ago with some  improvement. He has begun therapy and expects further progress.  He takes telmisartan 80 mg daily and hydralazine  50 mg twice daily for hypertension, which is well controlled.  He uses a BiPAP machine for sleep apnea and tolerates it well. He has not noticed recent changes in heart rhythm or new cardiac symptoms and overall feels better since starting the new anxiety medication.  Cardiac Studies relevent.      Labs    ROS  Review of Systems  Cardiovascular:  Negative for chest pain, dyspnea on exertion and leg swelling.  Musculoskeletal:  Positive for joint pain (gout flare- up since visit to [place] 3 days ago).   Physical Exam:   VS:  BP (!) 159/94 (BP Location: Left Arm, Patient Position: Sitting, Cuff Size: Large)   Pulse 61   Resp 16   Ht 5' 9 (1.753 m)   Wt 269 lb 1.6 oz (122.1 kg)   SpO2 96%   BMI 39.74 kg/m    Wt Readings from Last 3 Encounters:  03/13/24 269 lb 1.6 oz (122.1 kg)  02/05/24 267 lb 12.8 oz (121.5 kg)  01/10/24 271 lb 6.4 oz (123.1 kg)    BP Readings from Last 3 Encounters:  03/13/24 (!) 159/94  02/05/24 135/86  01/10/24 (!) 142/84   Physical Exam Neck:     Vascular: No carotid bruit or JVD.  Cardiovascular:     Rate and Rhythm: Normal rate and regular rhythm.     Pulses: Intact distal pulses.  Heart sounds: Normal heart sounds. No murmur heard.    No gallop.  Pulmonary:     Effort: Pulmonary effort is normal.     Breath sounds: Normal breath sounds.  Abdominal:     General: Bowel sounds are normal.     Palpations: Abdomen is soft.  Musculoskeletal:     Right lower leg: No edema.     Left lower leg: No edema.    EKG:         ASSESSMENT AND PLAN: .      ICD-10-CM   1. Paroxysmal atrial fibrillation (HCC)  I48.0     2. Primary hypertension  I10     3. OSA on BiPAP  G47.33     4. Anxiety  F41.9      Assessment & Plan Paroxysmal atrial fibrillation Managed with flecainide , effectively maintaining rhythm. Heart rate is low,  likely due to flecainide , but not concerning as it is well-managed and not associated with cardiac arrest risk. - Continue flecainide  100 mg twice daily. - He has done well with flecainide  over time and not on a negative chronotropic agent due to underlying bradycardia.  Primary hypertension Blood pressure is generally well-controlled with telmisartan 80 mg once daily. Recent increase likely due to dietary factors and stress from a recent trip. Hydralazine  is used for additional blood pressure control. - Continue telmisartan 80 mg once daily. - Increase hydralazine  to 50 mg three times daily for the next three to four days, then reduce to twice daily once blood pressure normalizes.  Obstructive sleep apnea Managed with BiPAP, which is being used effectively. - Continue BiPAP therapy.  Anxiety disorder Anxiety is being managed with medication and therapy. He has been on medication for ten days and reports feeling better. He is also seeing a therapist, though he has only had a few sessions. He expresses reluctance to continue medication but acknowledges some improvement. - Encouraged continued therapy sessions.   Follow up: 1 year  Signed,  Gordy Bergamo, MD, [facility] 03/13/2024, 5:20 PM [facility] [address] [place], [place] [address] Phone: [phone]. Fax:  [phone]

## 2024-03-17 ENCOUNTER — Ambulatory Visit: Admitting: Psychology

## 2024-03-17 DIAGNOSIS — F32A Depression, unspecified: Secondary | ICD-10-CM

## 2024-03-17 NOTE — Progress Notes (Unsigned)
 [name]  [name], [name]    Date: 03/17/24  Time Spent: 11:03 am - 12:04 pm : 61 Minutes  Treatment Type: Individual Therapy.  Reported Symptoms: Depression  Mental Status Exam: Appearance:  Casual     Behavior: Appropriate  Motor: Normal  Speech/Language:  Normal Rate  Affect: Congruent  Mood: dysthymic  Thought process: normal  Thought content:   WNL  Sensory/Perceptual disturbances:   WNL  Orientation: oriented to person, place, time/date, and situation  Attention: Good  Concentration: Good  Memory: WNL  Fund of knowledge:  Good  Insight:   Good  Judgment:  Good  Impulse Control: Good   Risk Assessment: Danger to Self:  No Self-injurious Behavior: No Danger to Others: No Duty to Warn:no Physical Aggression / Violence:No  Access to Firearms a concern: No  Gang Involvement:No   Subjective:   Ricky Burton Imm participated in Ricky session, in person in Ricky office with Ricky therapist, and consented to treatment Nassim reviewed Ricky events of Ricky past week. Ricky Burton noted electing to take Ricky trip in [place] and noted this being positive, overall. He noted that a change in routine, reconnecting with people, and time away from home was meaningful. He noted having more energy. He noted that he is always thinking about what's coming ahead. He noted it engrained to always be early and that being on-time is late. He noted that his father was hotel manager and was very punctual. He noted delays being frustrating and noted a recent experience where his wife, at dinner, taking ~10 minutes.  He noted wanting to be more active with his free time, while he is able to be more physical, but noted his wife's reluctance to engage. He noted these dynamics being in place throughout their marriage. He noted his attempt to communicate his needs. 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 He noted worry that he and his wife could experience Ricky same, without effort to maintain output and a level of activity. We worked on processing this during Ricky session and exploring Ricky various ways he has attempts to address these concerns in Ricky past. We worked on reviewing communication, assertiveness, and use of empathy. Ricky Burton noted his interest in his wife attending a follow-up session and therapist was receptive to this. We will work on exploring is goals and identifying ways to communicate this need to his wife. Ricky Burton was engaged and motivated during Ricky session. He expressed commitment towards goals. [name] during Ricky session. A follow-up was scheduled for continued treatment, which benefits from.   Interventions: CBT & interpersonal  Diagnosis:   Depression, unspecified depression type  Psychiatric Treatment: Yes , via PCP. [name], MD)  Behavioral Health Treatment Plan   [name]   [name]   Treatment Plan Development Date: 02/25/2024  Strengths: Family, Friends, Church, Spirituality, Hopefulness, Journalist, Newspaper, and Able to Communicate Effectively  Supports: Spouse and Friends   Theatre Manager of Needs: Ricky Burton would like to work on manage his mood, maintain consistency with socializing, improve attention, increase motivation, improve distress management, reduce fog, manage physical symptoms of distress, increase mindfulness, manage frustration, rumination.    Treatment Level: Individual Therapy and continued medication management.  Client Treatment Preferences: Outpatient Therapy.    Diagnosis Depressive Disorders  Depression, unspecified depression type   Symptoms:  Rumination, Depressed mood-indicated by subjective report or observation by others (  in children and adolescents, can be irritable mood)., Loss of interest or pleasure in almost all activities-indicated by subjective report or  observation by others., Significant (more than 5 percent in a month) unintentional weight loss/gain or decrease/increase in appetite (in children, failure to make expected weight gains)., Sleep disturbance (insomnia or hypersomnia)., Psychomotor changes (agitation or retardation) severe enough to be observable by others., and Impaired ability to think, concentrate, or make decisions-indicated by subjective report or observation by others.  Goals:  Alleviate depressive symptoms to return to effective functioning., Recognize, accept, and cope with depressed feelings., and Develop healthy thinking and beliefs about self, others, and Ricky world to alleviate and prevent relapse.  Objectives: Target Date For All Objectives: 02/24/25  Identify and replace thoughts and beliefs that support depression., Learn and implement strategies to overcome depression., Learn and implement problem-solving., Learn and implement conflict resolution skills., Learn and implement relapse prevention skills., and Verbalize an understanding of healthy and unhealthy emotions and increase Ricky use of healthy emotions.  Progress Documentation:  Progressing  Interventions:  Cognitive Behavioral Therapy, Assertiveness/Communication, Mindfulness Meditation, Psycho-education/Bibliotherapy, and Interpersonal   Expected duration of treatment: Evaluate after 1 year of treatment  Party responsible for implementation of interventions: Ricky Burton, Ricky Burton and Therapist, [employer], LCSW.  This plan has been reviewed and created by Ricky following participants: Ricky Burton, Ricky Burton and Therapist, [employer], LCSW.  This plan will be reviewed at least every 12 months.  Status of Treatment Plan Signature:  No, pending signature via MyChart.  Signature:  Elvie Mullet, LCSW

## 2024-03-17 NOTE — Progress Notes (Unsigned)
 Behavioral Health Treatment Plan   [name]   [name]   Treatment Plan Development Date: 02/25/2024  Strengths: Family, Friends, Church, Spirituality, Hopefulness, Journalist, Newspaper, and Able to Communicate Effectively  Supports: Spouse and Friends   Theatre Manager of Needs: [name] would like to work on manage his mood, maintain consistency with socializing, improve attention, increase motivation, improve distress management, reduce fog, manage physical symptoms of distress, increase mindfulness, manage frustration, rumination.    Treatment Level: Individual Therapy and continued medication management.  Client Treatment Preferences: Outpatient Therapy.    Diagnosis Depressive Disorders  Depression, unspecified depression type   Symptoms:  Rumination, Depressed mood-indicated by subjective report or observation by others (in children and adolescents, can be irritable mood)., Loss of interest or pleasure in almost all activities-indicated by subjective report or observation by others., Significant (more than 5 percent in a month) unintentional weight loss/gain or decrease/increase in appetite (in children, failure to make expected weight gains)., Sleep disturbance (insomnia or hypersomnia)., Psychomotor changes (agitation or retardation) severe enough to be observable by others., and Impaired ability to think, concentrate, or make decisions-indicated by subjective report or observation by others.  Goals:  Alleviate depressive symptoms to return to effective functioning., Recognize, accept, and cope with depressed feelings., and Develop healthy thinking and beliefs about self, others, and the world to alleviate and prevent relapse.  Objectives: Target Date For All Objectives: 02/24/25  Identify and replace thoughts and beliefs that support depression., Learn and implement strategies to overcome depression., Learn and implement problem-solving., Learn and  implement conflict resolution skills., Learn and implement relapse prevention skills., and Verbalize an understanding of healthy and unhealthy emotions and increase the use of healthy emotions.  Progress Documentation:  Progressing  Interventions:  Cognitive Behavioral Therapy, Assertiveness/Communication, Mindfulness Meditation, Psycho-education/Bibliotherapy, and Interpersonal   Expected duration of treatment: Evaluate after 1 year of treatment  Party responsible for implementation of interventions: The patient, [name] and Therapist, [employer], LCSW.  This plan has been reviewed and created by the following participants: The patient, [name] and Therapist, [employer], LCSW.  This plan will be reviewed at least every 12 months.  Status of Treatment Plan Signature:  No, pending signature via MyChart.  Signature:  Elvie Mullet, LCSW

## 2024-04-07 ENCOUNTER — Ambulatory Visit: Admitting: Psychology

## 2024-04-21 ENCOUNTER — Encounter: Payer: Self-pay | Admitting: Psychology

## 2024-04-21 ENCOUNTER — Ambulatory Visit: Admitting: Psychology

## 2024-04-21 DIAGNOSIS — F32A Depression, unspecified: Secondary | ICD-10-CM | POA: Diagnosis not present

## 2024-04-21 NOTE — Progress Notes (Signed)
 Behavioral Health Treatment Plan   [name]   [name]   Treatment Plan Development Date: 02/25/2024  Strengths: Family, Friends, Church, Spirituality, Hopefulness, Journalist, Newspaper, and Able to Communicate Effectively  Supports: Spouse and Friends   Theatre Manager of Needs: [name] to work on manage his mood, maintain consistency with socializing, improve attention, increase motivation, improve distress management, reduce fog, manage physical symptoms of distress, increase mindfulness, manage frustration, rumination.    Treatment Level: Individual Therapy and continued medication management.  Client Treatment Preferences: Outpatient Therapy.    Diagnosis Depressive Disorders  Depression, unspecified depression type   Symptoms:  Rumination, Depressed mood-indicated by subjective report or observation by others (in children and adolescents, can be irritable mood)., Loss of interest or pleasure in almost all activities-indicated by subjective report or observation by others., Significant (more than 5 percent in a month) unintentional weight loss/gain or decrease/increase in appetite (in children, failure to make expected weight gains)., Sleep disturbance (insomnia or hypersomnia)., Psychomotor changes (agitation or retardation) severe enough to be observable by others., and Impaired ability to think, concentrate, or make decisions-indicated by subjective report or observation by others.  Goals:  Alleviate depressive symptoms to return to effective functioning., Recognize, accept, and cope with depressed feelings., and Develop healthy thinking and beliefs about self, others, and the world to alleviate and prevent relapse.  Objectives: Target Date For All Objectives: 02/24/25  Identify and replace thoughts and beliefs that support depression., Learn and implement strategies to overcome depression., Learn and implement problem-solving., Learn and  implement conflict resolution skills., Learn and implement relapse prevention skills., and Verbalize an understanding of healthy and unhealthy emotions and increase the use of healthy emotions.  Progress Documentation:  Progressing  Interventions:  Cognitive Behavioral Therapy, Assertiveness/Communication, Mindfulness Meditation, Psycho-education/Bibliotherapy, and Interpersonal   Expected duration of treatment: Evaluate after 1 year of treatment  Party responsible for implementation of interventions: The patient, [name] and Therapist, [employer], LCSW.  This plan has been reviewed and created by the following participants: The patient, [name] and Therapist, [employer], LCSW.  This plan will be reviewed at least every 12 months.  Status of Treatment Plan Signature:  No, pending signature via MyChart. Initial date sent: March 07, 2024  Follow-ups sent on: 04/21/24  Signature:  Elvie Mullet, LCSW

## 2024-04-21 NOTE — Progress Notes (Signed)
 [facility] Counselor/Therapist Progress Note  Patient ID: Ricky Burton, [name]    Date: 04/21/2024  Time Spent: 11:33 am - 12:35 pm :62 Minutes  Treatment Type: Individual Therapy.  Reported Symptoms: Depression  Mental Status Exam: Appearance:  Casual     Behavior: Appropriate  Motor: Normal  Speech/Language:  Normal Rate  Affect: Congruent  Mood: dysthymic  Thought process: normal  Thought content:   WNL  Sensory/Perceptual disturbances:   WNL  Orientation: oriented to person, place, time/date, and situation  Attention: Good  Concentration: Good  Memory: WNL  Fund of knowledge:  Good  Insight:   Good  Judgment:  Good  Impulse Control: Good   Risk Assessment: Danger to Self:  No Self-injurious Behavior: No Danger to Others: No Duty to Warn:no Physical Aggression / Violence:No  Access to Firearms a concern: No  Gang Involvement:No   Subjective:   Ricky Burton participated in the session, in person in the office with the therapist, and consented to treatment Ricky Burton reviewed the events of the past week. [name] continues to take the Paxil  10 mg every day. He noted discontinuing his Rexulti.He noted I am not as down as I usually am. He noted his heart improving. He noted continued to experience fog and noted feeling like I am sleepy. He described it as a heavy eye and on the left side and noted this being an issue of mentally focusing not an issue of ocular focus. He noted that his symptoms can fluctuate but noted that they are present consistently. He noted his effort to try to get out of the fog and noted being dialed in on that feeling. He noted that he is sleeping well and does not attribute this fog to sleep. He noted ~5.5 hours of continuous sleep. He noted, at times, getting a second cup of coffee, twice a week due to feeling lethargic. We discussed the possible effects of this on his sleep and therapist provided  psycho-education regarding sleep and reviewed sleep hygiene. He noted visiting an ophthalmologist ~6 months ago to explore this issue and received a clear bill of health. He noted wondering if the feeling is emotional or physical. He noted that one of his triggers is his daughter's behavior. He noted I wake up worrying about her (daughter). He noted worry about the trajectory of her decision-making in relation to relationship, having consistent support, and have reliability and predictability. He noted when I wake up in the middle of the night, that's what I worry about.  We worked on exploring this during the session and highlighted the possibility that [name] is experiencing grief. We worked on exploring this during the session. [name] acknowledged this being a possibility and noted this resonating for him. He wondered how do I deal with past hurt with her? And noted often experiencing the reminders of the bullishit. [name]. We discussed future work in frustration tolerance, communication, and boundary setting for self and others. Therapist encouraged [name] to work on employing worry time between sessions. We will work on further processing going forward. [name] was engaged and motivated during the session and expressed commitment towards goals. Therapist praised [name] during the session and provided supportive therapy. A follow-up was scheduled for continued treatment, which [name] benefits from.   Interventions: CBT & interpersonal  Diagnosis:   Depression, unspecified depression type  Psychiatric Treatment: Yes , via PCP. [name], MD)  Behavioral Health Treatment Plan   [name]  [name]   Treatment Plan Development Date: 02/25/2024  Strengths: Family, Friends, Church, Spirituality, Hopefulness, Journalist, Newspaper, and Able to Communicate Effectively  Supports: Spouse and Friends   Theatre Manager of Needs: Ricky Burton would  like to work on manage his mood, maintain consistency with socializing, improve attention, increase motivation, improve distress management, reduce fog, manage physical symptoms of distress, increase mindfulness, manage frustration, rumination.    Treatment Level: Individual Therapy and continued medication management.  Client Treatment Preferences: Outpatient Therapy.    Diagnosis Depressive Disorders  Depression, unspecified depression type   Symptoms:  Rumination, Depressed mood-indicated by subjective report or observation by others (in children and adolescents, can be irritable mood)., Loss of interest or pleasure in almost all activities-indicated by subjective report or observation by others., Significant (more than 5 percent in a month) unintentional weight loss/gain or decrease/increase in appetite (in children, failure to make expected weight gains)., Sleep disturbance (insomnia or hypersomnia)., Psychomotor changes (agitation or retardation) severe enough to be observable by others., and Impaired ability to think, concentrate, or make decisions-indicated by subjective report or observation by others.  Goals:  Alleviate depressive symptoms to return to effective functioning., Recognize, accept, and cope with depressed feelings., and Develop healthy thinking and beliefs about self, others, and the world to alleviate and prevent relapse.  Objectives: Target Date For All Objectives: 02/24/25  Identify and replace thoughts and beliefs that support depression., Learn and implement strategies to overcome depression., Learn and implement problem-solving., Learn and implement conflict resolution skills., Learn and implement relapse prevention skills., and Verbalize an understanding of healthy and unhealthy emotions and increase the use of healthy emotions.  Progress Documentation:  Progressing  Interventions:  Cognitive Behavioral Therapy, Assertiveness/Communication, Mindfulness  Meditation, Psycho-education/Bibliotherapy, and Interpersonal   Expected duration of treatment: Evaluate after 1 year of treatment  Party responsible for implementation of interventions: The patient, Aaryan C Clinard and Therapist, [employer], LCSW.  This plan has been reviewed and created by the following participants: The patient, Kelli C Lewellen and Therapist, [employer], LCSW.  This plan will be reviewed at least every 12 months.  Status of Treatment Plan Signature:  No, pending signature via MyChart. Initial date sent: March 07, 2024  Follow-ups sent on: 04/21/24  Signature:  Elvie Mullet, LCSW

## 2024-05-03 ENCOUNTER — Other Ambulatory Visit: Payer: Self-pay | Admitting: Cardiology

## 2024-05-05 ENCOUNTER — Ambulatory Visit: Admitting: Psychology

## 2024-05-15 ENCOUNTER — Ambulatory Visit: Admitting: Neurology

## 2024-05-15 ENCOUNTER — Encounter: Payer: Self-pay | Admitting: Neurology

## 2024-05-15 VITALS — BP 135/84 | HR 59 | Ht 69.0 in | Wt 267.4 lb

## 2024-05-15 DIAGNOSIS — F422 Mixed obsessional thoughts and acts: Secondary | ICD-10-CM

## 2024-05-15 DIAGNOSIS — Z9989 Dependence on other enabling machines and devices: Secondary | ICD-10-CM

## 2024-05-15 DIAGNOSIS — I480 Paroxysmal atrial fibrillation: Secondary | ICD-10-CM

## 2024-05-15 DIAGNOSIS — G4733 Obstructive sleep apnea (adult) (pediatric): Secondary | ICD-10-CM

## 2024-05-15 MED ORDER — MIRTAZAPINE 30 MG PO TABS: 15.0000 mg | ORAL_TABLET | Freq: Every day | ORAL | 0 refills | Status: AC

## 2024-05-15 NOTE — Patient Instructions (Signed)
 ASSESSMENT AND PLAN :   64 y.o. year old male  here with:    1) Pressure sensation from the inside out, left face.  This is subjective , I cannot see puffiness, changes in motor function or  pupillary reaction, nor  droopiness.     2) severe depression , in counseling but clinically still depressed, suspect some somatization. Also bradycardia complicating the use of antidepressants.  He was given ilperidone samples  by his PCP and is reluctant to use it after learning its potentially casing slower heart rates. I am reluctant as well.  I recommend to speak with cardiology.   I refer to Geronto-Psychiatry, Dr Tasia  . In the meantime  I will switch the patient to remeron , 15 mg at bedtime, stop paxil .  SABRA   3) OSA :  doing well on BiPAP for OSA, 100% compliant.  That was the smallest part of our meeting today.  His insomnia related to racing thoughts , to  OC thoughts,    RV in 12 months for BiPAP follow up.    I would like to thank Dr Ladona and Valma Carwin, Md [address] [place],  [place] [address] for allowing me to meet with this pleasant patient.   Sleep Clinic Patients are generally offered input on sleep hygiene, life style changes and how to improve compliance with medical treatment where applicable. Review and reiteration of good sleep hygiene measures is offered to any sleep clinic patient, be it in the first consultation or with any follow up visits.    Any patient with sleepiness should be cautioned not to drive, work at heights, or operate dangerous or heavy equipment when feeling tired or sleepy.

## 2024-05-15 NOTE — Progress Notes (Signed)
 "        Provider:  Dedra Gores, MD  Primary Care Physician:  Valma Carwin, MD [address] [place] [address]     Referring Provider: Valma Carwin, Md [address] [place],  [place] [address]          Chief Complaint according to patient   Patient presents with:                HISTORY OF PRESENT ILLNESS:  Ricky Burton is a 64 y.o. male patient who is here for revisit 05/15/2024  officially for  sleep apnea. Here I with wife,    but he wants to report facial dyseasthesias and depression.  He is depressed, severely sad. He appears to feel  helpless and anxious.   He reports a pressure sensation on the left eye and temple, left face.   He reports bradycardia and couldn't tolerate Lexapro , or higher doses of Paxil .  He started taking Ambien daily  while it was prescribed for 3 times a week, 5 mg .   BiPAP is used ,  13/ 9 cm water with a low residual AHI 100% compliance.     Dr Ladona: Ricky Burton is a 64 y.o. AA male with history of hypertension with CKD stage III, hyperlipidemia, morbid obesity with obstructive sleep apnea on BiPAP, and history of CVA in 2001. Also has history of atrial flutter status post ablation in 2003 in [place] , Paroxysmal A. Fib and on Coumadin .    His last nuclear stress test on 12/09/2018 had revealed diaphragmatic continuation artifact in the inferior wall and LVEF at 48% considered low risk and echocardiogram from 12/09/2021.  Normal LV systolic function with moderate LVH.     He presents for follow-up, states that he was about to cancel the appointment as he is feeling well and has not had any further palpitations.  However he wanted to keep up the appointment to discuss medication changes.  He just returned from a 3-day trip to [place]  and admits to eating poorly and has now gout in his left ankle and blood pressure has been elevated.   Continue flecainide  100 mg twice daily. - He has done well with flecainide  over time and not on  a negative chronotropic agent due to underlying bradycardia.  HR was 51 minute  in 02-05-2024, Zio patch ordered in 2023, heart rate lowest at 43/minute  Review of Systems: Out of a complete 14 system review, the patient complains of only the following symptoms, and all other reviewed systems are negative.:    GDS 8/ 15 points !!     SLEEPINESS ?  How likely are you to doze in the following situations: 0 = not likely, 1 = slight chance, 2 = moderate chance, 3 = high chance  Sitting and Reading? Watching Television? Sitting inactive in a public place (theater or meeting)? Lying down in the afternoon when circumstances permit? Sitting and talking to someone? Sitting quietly after lunch without alcohol? In a car, while stopped for a few minutes in traffic? As a passenger in a car for an hour without a break?  Total = 3  FSS at 23         Social History   Socioeconomic History   Marital status: Married    Spouse name: Belinda   Number of children: 1   Years of education: HS   Highest education level: Not on file  Occupational History   Occupation: Retired  Tobacco Use   Smoking status: Never   Smokeless tobacco: Never  Vaping Use   Vaping status: Never Used  Substance and Sexual Activity   Alcohol use: Yes    Comment: occ beer   Drug use: No   Sexual activity: Not on file  Other Topics Concern   Not on file  Social History Narrative   Patient is married Print Production Planner) and lives at home with his wife.   Patient has two children.   Patient is right-handed.   Patient drinks one cup of coffee daily.      Social Drivers of Health   Tobacco Use: Low Risk (05/15/2024)   Patient History    Smoking Tobacco Use: Never    Smokeless Tobacco Use: Never    Passive Exposure: Not on file  Financial Resource Strain: Not on file  Food Insecurity: Not on file  Transportation Needs: Not on file  Physical Activity: Not on file  Stress: Not on file  Social Connections:  Unknown (08/08/2021)   Received from [facility]   Social Network    Social Network: Not on file  Depression (PHQ2-9): Not on file  Alcohol Screen: Not on file  Housing: Not on file  Utilities: Not on file  Health Literacy: Not on file    Family History  Problem Relation Age of Onset   Alzheimer's disease Mother    Sleep apnea Neg Hx    Depression Neg Hx     Past Medical History:  Diagnosis Date   A-fib (HCC)    Anxiety    Heart disease    High cholesterol    controlled   Hypertension    Morbid obesity (HCC)    OSA (obstructive sleep apnea)    Paroxysmal atrial fibrillation (HCC) 06/01/2010   Qualifier: Diagnosis of   By: Fernande, MD, CODY Elspeth Darner      Primary hypertension 05/07/2016   Stroke (HCC)    L sided in 2001    Past Surgical History:  Procedure Laterality Date   COLONOSCOPY WITH PROPOFOL  N/A 02/20/2020   Procedure: COLONOSCOPY WITH PROPOFOL ;  Surgeon: Rollin Dover, MD;  Location: [facility]COPY;  Service: Endoscopy;  Laterality: N/A;   HERNIA REPAIR  1986   POLYPECTOMY  02/20/2020   Procedure: POLYPECTOMY;  Surgeon: Rollin Dover, MD;  Location: [facility]OSCOPY;  Service: Endoscopy;;     Medications Ordered Prior to Encounter[1]  Allergies[2]   DIAGNOSTIC DATA (LABS, IMAGING, TESTING) - I reviewed patient records, labs, notes, testing and imaging myself where available.  Lab Results  Component Value Date   WBC 6.7 05/10/2016   HGB 14.3 05/10/2016   HCT 42.9 05/10/2016   MCV 96.4 05/10/2016   PLT 122 (L) 05/10/2016      Component Value Date/Time   NA 137 05/10/2016 0830   K 3.9 05/10/2016 0830   CL 108 05/10/2016 0830   CO2 21 (L) 05/10/2016 0830   GLUCOSE 97 05/10/2016 0830   BUN 16 05/10/2016 0830   CREATININE 1.70 (H) 05/10/2016 0830   CALCIUM  8.2 (L) 05/10/2016 0830   PROT 6.4 (L) 05/10/2016 0830   ALBUMIN 2.5 (L) 05/10/2016 0830   AST 47 (H) 05/10/2016 0830   ALT 45 05/10/2016 0830   ALKPHOS 74 05/10/2016 0830   BILITOT 1.5 (H)  05/10/2016 0830   GFRNONAA 43 (L) 05/10/2016 0830   GFRAA 50 (L) 05/10/2016 0830   No results found for: CHOL, HDL, LDLCALC, LDLDIRECT, TRIG, CHOLHDL No results found for: YHAJ8R No results found for:  VITAMINB12 No results found for: TSH  PHYSICAL EXAM:  Vitals:   05/15/24 1409 05/15/24 1425  BP: (!) 158/88 135/84  Pulse: (!) 59    No data found. Body mass index is 39.49 kg/m.   Wt Readings from Last 3 Encounters:  05/15/24 267 lb 6.4 oz (121.3 kg)  03/13/24 269 lb 1.6 oz (122.1 kg)  02/05/24 267 lb 12.8 oz (121.5 kg)     Ht Readings from Last 3 Encounters:  05/15/24 5' 9 (1.753 m)  03/13/24 5' 9 (1.753 m)  02/05/24 5' 9 (1.753 m)      General: The patient is awake, alert and appears not in acute distress and groomed. Head: Normocephalic, atraumatic.  Neck is supple. Neck is supple. Mallampati 4  neck circumference: 20. Nasal airflow  Patent = mildly congested . Retrognathia is seen.  No use of retainers, braces or dentures. Cardiovascular:  irregular rate and rhythm - but slow , without  murmurs or carotid bruit, and without distended neck veins. Respiratory: Lungs are clear to auscultation. Skin:  Without evidence of edema, or rash Trunk: BMI is super obese . The patient's posture is stooped   Neurologic exam : The patient is awake and alert, oriented to place and time.     Cranial nerves: no loss of smell or taste -  Pupils are equal and briskly reactive to light. Extraocular movements  in vertical and horizontal planes intact and without nystagmus.  Hearing to finger rub intact.  Facial sensation intact to fine touch. Facial motor strength is symmetric and his tongue and uvula move midline. Shoulder shrug was symmetrical.    Sensory: Grossly intact throughout to all modalities. Reflexes: Normal and symmetric throughout.     ASSESSMENT AND PLAN :   64 y.o. year old male  here with:    1) Pressure sensation from the inside out, left  face.  This is subjective , I cannot see puffiness, changes in motor function or  pupillary reaction, nor  droopiness.     2) severe depression , in counseling but clinically still depressed, suspect some somatization. Also bradycardia complicating the use of antidepressants.  He was given ilperidone samples  by his PCP and is reluctant to use it after learning its potentially casing slower heart rates. I am reluctant as well.  I recommend to speak with cardiology.   I refer to Geronto-Psychiatry, Dr Tasia  . In the meantime  I will switch the patient to remeron , 15 mg at bedtime, stop paxil .  .   3) OSA :  doing well on BiPAP for OSA, 100% compliant.  That was the smallest part of our meeting today.  His insomnia related to racing thoughts , to  OC thoughts,    RV in 12 months for BiPAP follow up.    I would like to thank Dr Ladona and Valma Carwin, Md [address] [place],  [place] [address] for allowing me to meet with this pleasant patient.   Sleep Clinic Patients are generally offered input on sleep hygiene, life style changes and how to improve compliance with medical treatment where applicable. Review and reiteration of good sleep hygiene measures is offered to any sleep clinic patient, be it in the first consultation or with any follow up visits.    Any patient with sleepiness should be cautioned not to drive, work at heights, or operate dangerous or heavy equipment when feeling tired or sleepy.      The patient will be seen in follow-up  in the sleep clinic at [facility] for discussion of test results, sleep related symptoms and treatment compliance review, further management strategies, etc.   The referring provider will be notified of the test results.   The patient's condition requires frequent monitoring and adjustments in the treatment plan, reflecting the ongoing complexity of care.  This provider is the continuing focal point for all needed services for this condition.  After  spending a total time of  29  minutes face to face and time for  history taking, physical and neurologic examination, review of laboratory studies,  personal review of imaging studies, reports and results of other testing and review of referral information / records as far as provided in visit,   Electronically signed by: Dedra Gores, MD 05/15/2024 2:33 PM  [facility] Sleep Board certified by The [employer] of Sleep Medicine and Diplomate of the [employer] of Sleep Medicine. Board certified In Neurology through the ABPN, Fellow of the [employer] of Neurology. Other Instructions Sleep Hygiene    Sleep hygiene refers to a set of practices and habits that promote quality sleep. It involves creating a consistent sleep schedule, establishing a relaxing bedtime routine, and maintaining a comfortable sleep environment.  Key Principles of Sleep Hygiene: Regular Sleep Schedule: Go to bed and wake up at the same time each day, even on weekends.  Bedtime Routine: Engage in calming activities before bed, such as taking a warm bath, reading, or listening to relaxing music.  Comfortable Sleep Environment: Ensure your bedroom is dark, quiet, and cool.  Avoid Stimulants Before Bed: Limit caffeine and alcohol intake several hours before bedtime.  Exercise Regularly: Engage in physical activity earlier in the day, but avoid strenuous exercise close to bedtime.  Limit Screen Time Before Bed: Turn off electronic devices at least 30 minutes before sleep.  Avoid Large Meals Before Bed: Don't eat heavy or spicy foods before going to sleep.  Get Enough Sunlight During the Day: Exposure to natural sunlight helps regulate your body's sleep-wake cycle.  Limit Naps: If you need to nap, keep them short (less than 30 minutes) and avoid napping late in the afternoon or evening.  Benefits of Good Sleep Hygiene:  Improved sleep quality Increased alertness and daytime  functioning Reduced risk of chronic diseases, such as obesity, heart disease, and diabetes Improved mood and mental well-being Additional Tips:  Consider using a sleep tracker or app to monitor your sleep patterns.  If you have persistent sleep problems, consult a healthcare professional for evaluation and treatment options. Avoid using sleeping pills without consulting a doctor.  By following these sleep hygiene practices, you can create a healthy sleep routine that promotes quality rest and overall well-being.        [1]  Current Outpatient Medications on File Prior to Visit  Medication Sig Dispense Refill   allopurinol (ZYLOPRIM) 100 MG tablet Take 100 mg by mouth daily.     flecainide  (TAMBOCOR ) 100 MG tablet TAKE 1 TABLET(100 MG) BY MOUTH TWICE DAILY 180 tablet 2   hydrALAZINE  (APRESOLINE ) 50 MG tablet Take 1 tablet (50 mg total) by mouth 3 (three) times daily. 270 tablet 3   omeprazole (PRILOSEC) 40 MG capsule Take 40 mg by mouth daily.     PARoxetine  (PAXIL ) 20 MG tablet Take 0.5 tablets (10 mg total) by mouth daily. 15 tablet 2   telmisartan (MICARDIS) 80 MG tablet Take 80 mg by mouth at bedtime.      warfarin (COUMADIN ) 5  MG tablet Take 5 mg by mouth at bedtime.      zolpidem (AMBIEN) 10 MG tablet Take 5 mg by mouth at bedtime as needed for sleep (do not exceed 4 nights a week). Prn 5 mg hs     brexpiprazole (REXULTI) 1 MG TABS tablet Take 1 mg by mouth daily.     Cholecalciferol (VITAMIN D3) 50 MCG (2000 UT) TABS Take 2,000 Units by mouth daily.     No current facility-administered medications on file prior to visit.  [2]  Allergies Allergen Reactions   Advair Diskus [Fluticasone-Salmeterol] Palpitations   "

## 2024-05-15 NOTE — Progress Notes (Signed)
 Ricky Burton

## 2024-05-27 ENCOUNTER — Ambulatory Visit: Admitting: Psychology

## 2025-01-12 ENCOUNTER — Ambulatory Visit: Admitting: Neurology

## 2025-05-19 ENCOUNTER — Ambulatory Visit: Admitting: Neurology
